# Patient Record
Sex: Male | Born: 1955 | Race: Black or African American | Hispanic: No | Marital: Single | Smoking: Current every day smoker
Health system: Southern US, Community
[De-identification: ages and names within clinical notes are randomized; demographics above are authoritative.]

## PROBLEM LIST (undated history)

## (undated) DIAGNOSIS — C61 Malignant neoplasm of prostate: Secondary | ICD-10-CM

## (undated) DIAGNOSIS — E7800 Pure hypercholesterolemia, unspecified: Secondary | ICD-10-CM

## (undated) DIAGNOSIS — I10 Essential (primary) hypertension: Secondary | ICD-10-CM

## (undated) DIAGNOSIS — I714 Abdominal aortic aneurysm, without rupture, unspecified: Secondary | ICD-10-CM

## (undated) HISTORY — DX: Essential (primary) hypertension: I10

## (undated) HISTORY — DX: Malignant neoplasm of prostate: C61

## (undated) HISTORY — DX: Abdominal aortic aneurysm, without rupture, unspecified: I71.40

---

## 1998-08-31 ENCOUNTER — Emergency Department (HOSPITAL_COMMUNITY): Admission: EM | Admit: 1998-08-31 | Discharge: 1998-08-31 | Payer: Self-pay | Admitting: Emergency Medicine

## 1998-09-07 ENCOUNTER — Emergency Department (HOSPITAL_COMMUNITY): Admission: EM | Admit: 1998-09-07 | Discharge: 1998-09-07 | Payer: Self-pay | Admitting: Emergency Medicine

## 2004-11-12 ENCOUNTER — Emergency Department (HOSPITAL_COMMUNITY): Admission: EM | Admit: 2004-11-12 | Discharge: 2004-11-12 | Payer: Self-pay | Admitting: Emergency Medicine

## 2004-12-04 ENCOUNTER — Emergency Department (HOSPITAL_COMMUNITY): Admission: EM | Admit: 2004-12-04 | Discharge: 2004-12-05 | Payer: Self-pay | Admitting: Emergency Medicine

## 2007-02-16 ENCOUNTER — Emergency Department (HOSPITAL_COMMUNITY): Admission: EM | Admit: 2007-02-16 | Discharge: 2007-02-16 | Payer: Self-pay | Admitting: Emergency Medicine

## 2007-02-16 ENCOUNTER — Inpatient Hospital Stay (HOSPITAL_COMMUNITY): Admission: AD | Admit: 2007-02-16 | Discharge: 2007-02-19 | Payer: Self-pay | Admitting: Psychiatry

## 2007-02-16 ENCOUNTER — Ambulatory Visit: Payer: Self-pay | Admitting: Psychiatry

## 2009-10-08 ENCOUNTER — Emergency Department: Payer: Self-pay | Admitting: Internal Medicine

## 2010-01-14 ENCOUNTER — Emergency Department (HOSPITAL_COMMUNITY): Admission: EM | Admit: 2010-01-14 | Discharge: 2010-01-14 | Payer: Self-pay | Admitting: Emergency Medicine

## 2010-08-29 NOTE — H&P (Signed)
NAMEMarland Pratt  GABERIEL, [name]:  [id number]   MEDICAL RECORD NO.:  [id number]          PATIENT TYPE:  IPS   LOCATION:  0502                          FACILITY:  [facility]   PHYSICIAN:  Geoffery Lyons, M.D.      DATE OF BIRTH:  02-Jun-1955   DATE OF ADMISSION:  02/16/2007  DATE OF DISCHARGE:                       PSYCHIATRIC ADMISSION ASSESSMENT   IDENTIFYING INFORMATION:  [name], divorced.  This is a voluntary admission.   HISTORY OF THE PRESENT ILLNESS:  This patient went to the emergency room  after smoking crack cocaine when he developed chest pain, admitted there  to having suicidal thoughts, feeling he cannot get his addiction under  control, and future is hopeless.  He has been using a couple times a  week cocaine, smoking rock, with a history of 25 years of cocaine use,  with intermittent periods of abstinence ranging from 3-6 months.  Most  recently he has been using about twice a week for several weeks.  He  says he gets depressed later in the day, begins to drink a couple of  beers, then will use cocaine to get high.  Endorses psychosocial  stressors of dissatisfaction with his current relationship and home  situation, is planning to leave his current girlfriend with whom he  lives, but has been unable to get financially stable enough to do so.  Would like to stop using drugs.  Denies any prior suicide attempts.  He  denies any symptoms of alcohol withdrawal.   PAST PSYCHIATRIC HISTORY:  The patient has one prior admission to [facility] in 1998 followed by a series of outpatient  treatments in the chemical dependency intensive outpatient program at  [facility] and maintained abstinence for about 3 months after that.  He  reports no prior psychotropic medications.  No history of learning  problems, brain injury, or blackout spells.   SOCIAL HISTORY:  The patient is a divorced African-American male with 3  children by 3  different relationships, children ages 110 and 32, with the  56-year-old living with him currently with his current girlfriend, along  with 3 of her children.  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  He is paying child support and having his wages garnished at  this time for back taxes.  He has a history of DWI about 5-6 years ago,  no current charges.   FAMILY HISTORY:  Remarkable for both parents with regular alcohol use  and eldest brother committed suicide at age 46 and was also a heavy  drinker.   MEDICAL HISTORY:  Patient is smoking 1 pack per day of tobacco.  Significant medical problems are chest pain after the cocaine use, this  was his first episode of chest pain, and his workup in the emergency  room was negative for acute cardiac event.   CURRENT MEDICATIONS:  None.   DRUG ALLERGIES:  NONE.   POSITIVE PHYSICAL FINDINGS:  His full physical exam is noted in the  emergency room and is unremarkable.  A healthy-appearing African-  American male in no distress.  Urine drug screen was positive for  cocaine and CBC showed some mild leukocytosis with WBC 13.3.  His EKG  showed normal sinus rhythm.  Alcohol level was less 7.  Vital signs  normal and cardiac markers were within normal limits.  His TSH is normal  at 2.001.   MENTAL STATUS EXAM:  Fully alert male, polite and appropriate.  He is  calm and well focused.  Speech is normal in pace, tone and production.  He is articulate, relevant.  Mood is depressed.  Thought process  logical, linear, no evidence of psychosis.  No delusional statements, no  paranoia or guarding.  He has expressed a lot of frustration with his  current home situation, finding it difficult to go back there in the  evenings, which triggers additional drug use, feeling frustrated, trying  to think through how best to move on with his life.  Cognition is well-  preserved.   Insight good.  Impulse control and judgment within normal  limits.  Concentration and calculation are intact.  Cognition is fully  preserved.   Axis I:  Depressive disorder, not otherwise specified; cocaine abuse,  rule out dependence.  Axis II:  Deferred.  Axis III:  Tobacco abuse.  Axis IV:  Severe issues with relationship conflict and finances.  Axis V:  Current 38, past year 70.   PLAN:  Is to voluntarily admit the patient with a goal of alleviating  his suicidal thought.  We are going to start him on trazodone 100 mg at  bedtime, may be repeated x1, and Symmetrel 100 mg p.o. b.i.d. for his  cocaine dependence.  Will give him a chance to settle down, start him  with counseling, and we will proceed from there.  Estimated length of  stay is 5 days.  He is currently on sponsorship from [facility] and they have seen him.  He will plan to follow up there.      Margaret A. Scott, N.P.      Geoffery Lyons, M.D.  Electronically Signed    MAS/MEDQ  D:  02/17/2007  T:  02/18/2007  Job:  [id number]

## 2010-09-01 NOTE — Discharge Summary (Signed)
Tyler Pratt, Tyler Pratt NO.:  [id number]   MEDICAL RECORD NO.:  [id number]          PATIENT TYPE:  IPS   LOCATION:  0502                          FACILITY:  [facility]   PHYSICIAN:  Geoffery Lyons, M.D.      DATE OF BIRTH:  [birth year]   DATE OF ADMISSION:  02/16/2007  DATE OF DISCHARGE:  02/19/2007                               DISCHARGE SUMMARY   CHIEF COMPLAINT:  Chest pain.   HISTORY OF PRESENT ILLNESS:  This was the second admission to [facility] for this 55 year old African American male admitted for  __________ .  He developed chest pain.  admitted to having suicidal  thoughts, saying he could not get his __________ under control, feeling  hopeless, has been using a couple of times a week, smoking __________ 25  years of cocaine use.  Intermittent use of __________.  More recently  has been using twice a week for several weeks, is depressed, said in the  day he needs to drink a couple of beers then will use cocaine to  __________.  He is satisfied with his current relationship and home  situation, planning to __________ according to girlfriend with whom he  lives.   PAST PSYCHIATRIC HISTORY:  One prior admission __________ at [facility] followed by a series of outpatient treatments in the __________  [facility].  Obtained abstinence for 10 months.   PAST MEDICAL HISTORY:  As already stated.  Persistent use of cocaine as  well as alcohol.   MEDICAL HISTORY:  Noncontributory.   MEDICATIONS:  None.   PHYSICAL EXAMINATION:  Performed failed to show any acute findings.   LABORATORY WORKUP:  PSA is 2.001.  CBC:  White blood cells 13.3.  EKG:  Within normal limits.  TSH 2.001.  UDS:  Positive for cocaine.   MENTAL STATUS EXAMINATION:  At this time reveals an alert, cooperative  male.  Speech was normal in pace, tone and production; articulate.  Mood  was depressed.  Affect depressed.  Thought process was coherent and  relevant.  No evidence of  delusions, no hallucinations.   ADMISSION DIAGNOSES:  AXIS I:  Depressive disorder, not otherwise  specified.  Cocaine, alcohol abuse, rule out dependence.  AXIS II:  No diagnosis.  AXIS III:  No diagnosis.  AXIS IV:  Moderate.  AXIS V:  Upon admission, 55.  Highest GAF in the last year is 65.   COURSE IN THE HOSPITAL:  He was admitted.  He was started in individual  and group psychotherapy.  __________ use of crack cocaine, been  __________ for the last 3 days and __________ chest pain.  Did not want  to live anymore, using a couple of times a week, started drinking then  using crack.  Endorsed that he also uses whatever he can get to help him  relax __________ and opioids.  Endorsed __________ sleep.  Endorses that  he does not have a home, living with a girlfriend.  Denies having  children.  __________ years old, older kids.  Working in a group home  and  the [place] and 12 hours a day and a couple of hours during the  weekends.  On admission, had some suicidal thoughts with plans of  overdosing.   PAST PSYCHIATRIC HISTORY:  [place] inpatient and then __________ as  well as [facility].  He endorsed that he knew he could  not go back to the same ways of behavior.  He had chest pains the last  time he was using cocaine.  __________ the fact that he did not want to  die then.  __________ that he wants to be alive.  He believes that he  wants to be alive ago __________ back to using cocaine and he wants to  be successful.  However, __________ he was being spiteful and was going  back with the girlfriend but __________ in several months get a place  and stay by himself.  Meanwhile, endorses he was going to make it work  as far as being in the house.  Avoid any conflict __________ with the  girlfriend.  He was committed to __________, and there were no suicidal  or homicidal ideas, hallucinations and there was no evidence of  delusions.  __________ and treatment of  what we are going to have and  discharge to outpatient followup.   DISCHARGE DIAGNOSES:  AXIS I:  Cocaine, alcohol, benzodiazepines and  opiate abuse, depressive disorder not otherwise specified.  AXIS II:  No diagnosis.  AXIS III:  No diagnosis.  AXIS IV:  Moderate.  AXIS V:  On discharge, __________.   Discharged on __________ 100 twice a day, __________ at bedtime as  needed for sleep, __________ used for __________ and __________ for  counseling.      Geoffery Lyons, M.D.  Electronically Signed     IL/MEDQ  D:  03/10/2007  T:  03/11/2007  Job:  [id number]

## 2011-01-23 LAB — BASIC METABOLIC PANEL
BUN: 8
Chloride: 104
Potassium: 3.7
Sodium: 139

## 2011-01-23 LAB — DIFFERENTIAL
Eosinophils Absolute: 0
Eosinophils Relative: 0
Lymphs Abs: 2
Monocytes Absolute: 0.7
Monocytes Relative: 5

## 2011-01-23 LAB — RAPID URINE DRUG SCREEN, HOSP PERFORMED
Barbiturates: NOT DETECTED
Benzodiazepines: NOT DETECTED

## 2011-01-23 LAB — CBC
HCT: 39.6
Hemoglobin: 13.6
MCV: 90.7
Platelets: 310
RBC: 4.36
WBC: 13.3 — ABNORMAL HIGH

## 2011-01-23 LAB — HEPATIC FUNCTION PANEL
ALT: 12
AST: 13
Albumin: 3.6
Total Protein: 6.6

## 2011-01-23 LAB — TSH: TSH: 2.001

## 2011-01-23 LAB — ETHANOL: Alcohol, Ethyl (B): 7

## 2011-01-23 LAB — POCT CARDIAC MARKERS: Troponin i, poc: <0.05

## 2013-04-02 LAB — HEPATIC FUNCTION PANEL
ALT: 16 U/L (ref 10–40)
AST: 18 U/L (ref 14–40)
Alkaline Phosphatase: 85 U/L (ref 25–125)
Bilirubin, Direct: 0.08 mg/dL (ref 0.01–0.4)
Bilirubin, Total: 0.3 mg/dL

## 2013-04-02 LAB — LIPID PANEL
Cholesterol: 278 mg/dL — AB (ref 0–200)
HDL: 36 mg/dL (ref 35–70)
LDL CALC: 186 mg/dL
TRIGLYCERIDES: 280 mg/dL — AB (ref 40–160)

## 2013-04-02 LAB — CBC AND DIFFERENTIAL
HCT: 41 % (ref 41–53)
HEMOGLOBIN: 14 g/dL (ref 13.5–17.5)
NEUTROS ABS: 5 /uL
PLATELETS: 342 10*3/uL (ref 150–399)
WBC: 8.6 10^3/mL

## 2013-04-02 LAB — BASIC METABOLIC PANEL
BUN: 12 mg/dL (ref 4–21)
Creatinine: 1 mg/dL (ref 0.6–1.3)
Glucose: 82 mg/dL
Potassium: 4.6 mmol/L (ref 3.4–5.3)
SODIUM: 142 mmol/L (ref 137–147)

## 2013-04-02 LAB — HEMOGLOBIN A1C: Hemoglobin A1C: 5.8

## 2013-04-14 ENCOUNTER — Ambulatory Visit: Payer: Self-pay

## 2013-10-25 ENCOUNTER — Emergency Department (HOSPITAL_COMMUNITY)
Admission: EM | Admit: 2013-10-25 | Discharge: 2013-10-25 | Disposition: A | Discharge status: Home / Self Care | Payer: Self-pay | Attending: Emergency Medicine | Admitting: Emergency Medicine

## 2013-10-25 ENCOUNTER — Encounter (HOSPITAL_COMMUNITY): Payer: Self-pay | Admitting: Emergency Medicine

## 2013-10-25 ENCOUNTER — Emergency Department (HOSPITAL_COMMUNITY): Payer: Self-pay

## 2013-10-25 DIAGNOSIS — Z8639 Personal history of other endocrine, nutritional and metabolic disease: Secondary | ICD-10-CM | POA: Insufficient documentation

## 2013-10-25 DIAGNOSIS — F17200 Nicotine dependence, unspecified, uncomplicated: Secondary | ICD-10-CM | POA: Insufficient documentation

## 2013-10-25 DIAGNOSIS — Z862 Personal history of diseases of the blood and blood-forming organs and certain disorders involving the immune mechanism: Secondary | ICD-10-CM | POA: Insufficient documentation

## 2013-10-25 DIAGNOSIS — N453 Epididymo-orchitis: Secondary | ICD-10-CM | POA: Insufficient documentation

## 2013-10-25 DIAGNOSIS — N451 Epididymitis: Secondary | ICD-10-CM

## 2013-10-25 HISTORY — DX: Pure hypercholesterolemia, unspecified: E78.00

## 2013-10-25 LAB — URINALYSIS, ROUTINE W REFLEX MICROSCOPIC
Glucose, UA: NEGATIVE mg/dL
HGB URINE DIPSTICK: NEGATIVE
Ketones, ur: NEGATIVE mg/dL
Nitrite: NEGATIVE
PROTEIN: NEGATIVE mg/dL
SPECIFIC GRAVITY, URINE: 1.034 — AB (ref 1.005–1.030)
Urobilinogen, UA: 1 mg/dL (ref 0.0–1.0)
pH: 5.5 (ref 5.0–8.0)

## 2013-10-25 LAB — URINE MICROSCOPIC-ADD ON

## 2013-10-25 MED ORDER — CEFTRIAXONE SODIUM 250 MG IJ SOLR
250.0000 mg | Freq: Once | INTRAMUSCULAR | Status: AC
  Administered 2013-10-25: 250 mg via INTRAMUSCULAR
  Filled 2013-10-25: qty 250

## 2013-10-25 MED ORDER — HYDROCODONE-ACETAMINOPHEN 5-325 MG PO TABS: 1.0000 | ORAL_TABLET | Freq: Four times a day (QID) | ORAL | Status: DC | PRN

## 2013-10-25 MED ORDER — CIPROFLOXACIN HCL 500 MG PO TABS
500.0000 mg | ORAL_TABLET | Freq: Two times a day (BID) | ORAL | Status: DC
Start: 2013-10-25 — End: 2020-08-23

## 2013-10-25 MED ORDER — IBUPROFEN 200 MG PO TABS
600.0000 mg | ORAL_TABLET | Freq: Once | ORAL | Status: AC
  Administered 2013-10-25: 600 mg via ORAL
  Filled 2013-10-25: qty 3

## 2013-10-25 MED ORDER — AZITHROMYCIN 250 MG PO TABS
1000.0000 mg | ORAL_TABLET | Freq: Once | ORAL | Status: AC
  Administered 2013-10-25: 1000 mg via ORAL
  Filled 2013-10-25: qty 4

## 2013-10-25 NOTE — ED Notes (Signed)
Pt reports unprotected sex 2 weeks ago, pt has had scrotal pain x1 week. Pain 8/10. Denies drainage.

## 2013-10-25 NOTE — ED Notes (Signed)
Bed: WTR7 Expected date:  Expected time:  Means of arrival:  Comments: [place]

## 2013-10-25 NOTE — Discharge Instructions (Signed)
Take cipro (antibiotic) as prescribed. Take motrin or aleve as need for pain. You may also take hydrocodone as need for pain. No driving for the next 6 hours or when taking hydrocodone. Also, do not take tylenol or acetaminophen containing medication when taking hydrocodone. Follow up with primary care doctor in 1 week if symptoms fail to improve/resolve. Return to ER if worse, new symptoms, severe pain, high fevers, persistent vomiting, other concern.  Your ultrasound was read as showing epididymitis, and hydrocele - follow up with your doctor.    Epididymitis Epididymitis is a swelling (inflammation) of the epididymis. The epididymis is a cord-like structure along the back part of the testicle. Epididymitis is usually, but not always, caused by infection. This is usually a sudden problem beginning with chills, fever and pain behind the scrotum and in the testicle. There may be swelling and redness of the testicle. DIAGNOSIS  Physical examination will reveal a tender, swollen epididymis. Sometimes, cultures are obtained from the urine or from prostate secretions to help find out if there is an infection or if the cause is a different problem. Sometimes, blood work is performed to see if your white blood cell count is elevated and if a germ (bacterial) or viral infection is present. Using this knowledge, an appropriate medicine which kills germs (antibiotic) can be chosen by your caregiver. A viral infection causing epididymitis will most often go away (resolve) without treatment. HOME CARE INSTRUCTIONS   Hot sitz baths for 20 minutes, 4 times per day, may help relieve pain.  Only take over-the-counter or prescription medicines for pain, discomfort or fever as directed by your caregiver.  Take all medicines, including antibiotics, as directed. Take the antibiotics for the full prescribed length of time even if you are feeling better.  It is very important to keep all follow-up appointments. SEEK  IMMEDIATE MEDICAL CARE IF:   You have a fever.  You have pain not relieved with medicines.  You have any worsening of your problems.  Your pain seems to come and go.  You develop pain, redness, and swelling in the scrotum and surrounding areas. MAKE SURE YOU:   Understand these instructions.  Will watch your condition.  Will get help right away if you are not doing well or get worse. Document Released: 03/30/2000 Document Revised: 06/25/2011 Document Reviewed: 02/17/2009 [facility] Patient Information 2015 [place], [place]. This information is not intended to replace advice given to you by your health care provider. Make sure you discuss any questions you have with your health care provider.    Safe Sex Safe sex is about reducing the risk of giving or getting a sexually transmitted disease (STD). STDs are spread through sexual contact involving the genitals, mouth, or rectum. Some STDs can be cured and others cannot. Safe sex can also prevent unintended pregnancies.  WHAT ARE SOME SAFE SEX PRACTICES?  Limit your sexual activity to only one partner who is only having sex with you.  Talk to your partner about his or her past partners, past STDs, and drug use.  Use a condom every time you have sexual intercourse. This includes vaginal, oral, and anal sexual activity. Both females and males should wear condoms during oral sex. Only use latex or polyurethane condoms and water-based lubricants. Using petroleum-based lubricants or oils to lubricate a condom will weaken the condom and increase the chance that it will break. The condom should be in place from the beginning to the end of sexual activity. Wearing a condom reduces, but  does not completely eliminate, your risk of getting or giving an STD. STDs can be spread by contact with infected body fluids and skin.  Get vaccinated for hepatitis B and HPV.  Avoid alcohol and recreational drugs which can affect your judgement. You may forget to  use a condom or participate in high-risk sex.  For females, avoid douching after sexual intercourse. Douching can spread an infection farther into the reproductive tract.  Check your body for signs of sores, blisters, rashes, or unusual discharge. See your health care provider if you notice any of these signs.  Avoid sexual contact if you have symptoms of an infection or are being treated for an STD. If you or your partner has herpes, avoid sexual contact when blisters are present. Use condoms at all other times.  If you are at risk of being infected with HIV, it is recommended that you take a prescription medicine daily to prevent HIV infection. This is called pre-exposure prophylaxis (PrEP). You are considered at risk if:  You are a man who has sex with other men (MSM).  You are a heterosexual man or woman who is sexually active with more than one partner.  You take drugs by injection.  You are sexually active with a partner who has HIV.  Talk with your health care provider about whether you are at high risk of being infected with HIV. If you choose to begin PrEP, you should first be tested for HIV. You should then be tested every 3 months for as long as you are taking PrEP.  See your health care provider for regular screenings, exams, and tests for other STDs. Before having sex with a new partner, each of you should be screened for STDs and should talk about the results with each other. WHAT ARE THE BENEFITS OF SAFE SEX?   There is less chance of getting or giving an STD.  You can prevent unwanted or unintended pregnancies.  By discussing safe sex concerns with your partner, you may increase feelings of intimacy, comfort, trust, and honesty between the two of you. Document Released: 05/10/2004 Document Revised: 04/07/2013 Document Reviewed: 09/24/2011 [facility] Patient Information 2015 [place], [place]. This information is not intended to replace advice given to you by your health care  provider. Make sure you discuss any questions you have with your health care provider.    Hydrocele, Adult Fluid can collect around the testicles. This fluid forms in a sac. This condition is called a hydrocele. The collected fluid causes swelling of the scrotum. Usually, it affects just one testicle. Most of the time, the condition does not cause pain. Sometimes, the hydrocele goes away on its own. Other times, surgery is needed to get rid of the fluid. CAUSES A hydrocele does not develop often. Different things can cause a hydrocele in a man, including:  Injury to the scrotum.  Infection.  X-ray of the area around the scrotum.  A tumor or cancer of the testicle.  Twisting of a testicle.  Decreased blood flow to the scrotum. SYMPTOMS   Swelling without pain. The hydrocele feels like a water-filled balloon.  Swelling with pain. This can occur if the hydrocele was caused by infection or twisting.  Mild discomfort in the scrotum.  The hydrocele may feel heavy.  Swelling that gets smaller when you lie down. DIAGNOSIS  Your caregiver will do a physical exam to decide if you have a hydrocele. This may include:  Asking questions about your overall health, today and in  the past. Your caregiver may ask about any injuries, X-rays, or infections.  Pushing on your abdomen or asking you to change positions to see if the size of the hydrocele changes.  Shining a light through the scrotum (transillumination) to see if the fluid inside the scrotum is clear.  Blood tests and urine tests to check for infection.  Imaging studies that take pictures of the scrotum and testicles. TREATMENT  Treatment depends in part on what caused the condition. Options include:  Watchful waiting. Your caregiver checks the hydrocele every so often.  Different surgeries to drain the fluid.  A needle may be put into the scrotum to drain fluid (needle aspiration). Fluid often returns after this type of  treatment.  A cut (incision) may be made in the scrotum to remove the fluid sac (hydrocelectomy).  An incision may be made in the groin to repair a hydrocele that has contact with abdominal fluids (communicating hydrocele).  Medicines to treat an infection (antibiotics). HOME CARE INSTRUCTIONS  What you need to do at home may depend on the cause of the hydrocele and type of treatment. In general:  Take all medicine as directed by your caregiver. Follow the directions carefully.  Ask your caregiver if there is anything you should not do while you recover (activities, lifting, work, sex).  If you had surgery to repair a communicating hydrocele, recovery time may vary. Ask you caregiver about your recovery time.  Avoid heavy lifting for 4 to 6 weeks.  If you had an incision on the scrotum or groin, wash it for 2 to 3 days after surgery. Do this as long as the skin is closed and there are no gaps in the wound. Wash gently, and avoid rubbing the incision.  Keep all follow-up appointments. SEEK MEDICAL CARE IF:   Your scrotum seems to be getting larger.  The area becomes more and more uncomfortable. SEEK IMMEDIATE MEDICAL CARE IF:  You have a fever. Document Released: 09/20/2009 Document Revised: 01/21/2013 Document Reviewed: 09/20/2009 [facility] Patient Information 2015 [place], [place]. This information is not intended to replace advice given to you by your health care provider. Make sure you discuss any questions you have with your health care provider.

## 2013-10-25 NOTE — ED Provider Notes (Signed)
CSN: [id number]     Arrival date & time 10/25/13  1043 History   First MD Initiated Contact with Patient 10/25/13 1117     Chief Complaint  Patient presents with  . SEXUALLY TRANSMITTED DISEASE     (Consider location/radiation/quality/duration/timing/severity/associated sxs/prior Treatment) The history is provided by the patient.  pt c/o left testicle/scrotum pain in past few days, constant, dull, moderate-severe, states acutely worse in past day. No dysuria or hematuria. No hx same. No trauma to area. Notes recent unprotected sex w unknown male. No known std exposure. No genital sores/ulcers. No penile discharge or dysuria. No hx epididymitis or torsion. No drainage to area.  No fever or chills. No abd pain.     Past Medical History  Diagnosis Date  . High cholesterol    History reviewed. No pertinent past surgical history. History reviewed. No pertinent family history. History  Substance Use Topics  . Smoking status: Current Every Day Smoker -- 1.00 packs/day for 40 years    Types: Cigarettes  . Smokeless tobacco: Not on file  . Alcohol Use: Yes     Comment: weekly     Review of Systems  Constitutional: Negative for fever and chills.  HENT: Negative for sore throat.   Eyes: Negative for redness.  Respiratory: Negative for shortness of breath.   Cardiovascular: Negative for chest pain.  Gastrointestinal: Negative for vomiting and abdominal pain.  Genitourinary: Positive for scrotal swelling and testicular pain. Negative for dysuria, hematuria and flank pain.  Musculoskeletal: Negative for back pain and neck pain.  Skin: Negative for rash.  Neurological: Negative for headaches.  Hematological: Does not bruise/bleed easily.  Psychiatric/Behavioral: Negative for confusion.      Allergies  Review of patient's allergies indicates no known allergies.  Home Medications   Prior to Admission medications   Medication Sig Start Date End Date Taking? Authorizing Provider   HYDROcodone-acetaminophen (NORCO) 7.5-325 MG per tablet Take 1 tablet by mouth once.   Yes Historical Provider, MD   BP 127/87  Pulse 92  Temp(Src) 98.7 F (37.1 C) (Oral)  Resp 16  SpO2 100% Physical Exam  Nursing note and vitals reviewed. Constitutional: He is oriented to person, place, and time. He appears well-developed and well-nourished. No distress.  HENT:  Mouth/Throat: Oropharynx is clear and moist.  Neck: Neck supple. No tracheal deviation present.  Cardiovascular: Normal rate.   Pulmonary/Chest: Effort normal. No accessory muscle usage. No respiratory distress.  Abdominal: Soft. Bowel sounds are normal. He exhibits no distension. There is no tenderness.  Genitourinary:  Left testicle/scrotal swelling, diffuse left testicle tenderness ?esp epididymal tenderness. No cellulitis or skin changes. No fluctuance/abscess. No penile discharge. No fem/ing l/a.   Musculoskeletal: Normal range of motion. He exhibits no edema.  Neurological: He is alert and oriented to person, place, and time.  Skin: Skin is warm and dry. No rash noted. He is not diaphoretic.  Psychiatric: He has a normal mood and affect.    ED Course  Procedures (including critical care time) Labs Review  Results for orders placed during the hospital encounter of 10/25/13  URINALYSIS, ROUTINE W REFLEX MICROSCOPIC      Result Value Ref Range   Color, Urine AMBER (*) YELLOW   APPearance CLEAR  CLEAR   Specific Gravity, Urine 1.034 (*) 1.005 - 1.030   pH 5.5  5.0 - 8.0   Glucose, UA NEGATIVE  NEGATIVE mg/dL   Hgb urine dipstick NEGATIVE  NEGATIVE   Bilirubin Urine SMALL (*) NEGATIVE  Ketones, ur NEGATIVE  NEGATIVE mg/dL   Protein, ur NEGATIVE  NEGATIVE mg/dL   Urobilinogen, UA 1.0  0.0 - 1.0 mg/dL   Nitrite NEGATIVE  NEGATIVE   Leukocytes, UA TRACE (*) NEGATIVE  URINE MICROSCOPIC-ADD ON      Result Value Ref Range   WBC, UA 0-2  <3 WBC/hpf   Bacteria, UA FEW (*) RARE   Urine-Other MUCOUS PRESENT      US Scrotum  10/25/2013   CLINICAL DATA:  Left testicular pain.  EXAM: SCROTAL ULTRASOUND  DOPPLER ULTRASOUND OF THE TESTICLES  TECHNIQUE: Complete ultrasound examination of the testicles, epididymis, and other scrotal structures was performed. Color and spectral Doppler ultrasound were also utilized to evaluate blood flow to the testicles.  COMPARISON:  None.  FINDINGS: Right testicle  Measurements: 4.1 x 1.8 x 2.8 cm.  6 mm simple cyst.  Left testicle  Measurements:  3.8 x 2.4 x 3.2 cm.  3 mm simple cyst.  Right epididymis:  Normal in size and appearance.  Left epididymis: Heterogeneous enlarged left epididymis with increased flow consistent epididymitis.  Hydrocele: Bilateral hydroceles with moderate-sized hydrocele on the left.  Varicocele:  None visualized.  Pulsed Doppler interrogation of both testes demonstrates low resistance arterial and venous waveforms bilaterally.  IMPRESSION: Findings consist with left epididymitis with moderate-sized hydrocele. No evidence of testicular torsion.   Electronically Signed   By: Marcello Moores  Register   On: 10/25/2013 13:46   [place] Art/ven Flow Abd Pelv Doppler  10/25/2013   CLINICAL DATA:  Left testicular pain.  EXAM: SCROTAL ULTRASOUND  DOPPLER ULTRASOUND OF THE TESTICLES  TECHNIQUE: Complete ultrasound examination of the testicles, epididymis, and other scrotal structures was performed. Color and spectral Doppler ultrasound were also utilized to evaluate blood flow to the testicles.  COMPARISON:  None.  FINDINGS: Right testicle  Measurements: 4.1 x 1.8 x 2.8 cm.  6 mm simple cyst.  Left testicle  Measurements:  3.8 x 2.4 x 3.2 cm.  3 mm simple cyst.  Right epididymis:  Normal in size and appearance.  Left epididymis: Heterogeneous enlarged left epididymis with increased flow consistent epididymitis.  Hydrocele: Bilateral hydroceles with moderate-sized hydrocele on the left.  Varicocele:  None visualized.  Pulsed Doppler interrogation of both testes demonstrates low  resistance arterial and venous waveforms bilaterally.  IMPRESSION: Findings consist with left epididymitis with moderate-sized hydrocele. No evidence of testicular torsion.   Electronically Signed   By: Marcello Moores  Register   On: 10/25/2013 13:46     MDM  Labs. Motrin po.   Pt notes recent unprotected sex.   Rocephin im. Zithromax.   U/s c/w epididymitis.  rx cipro and pain rx for home.  Pt stable for d/c.     Mirna Mires, MD 10/25/13 [phone]

## 2016-07-27 ENCOUNTER — Emergency Department (HOSPITAL_COMMUNITY)
Admission: EM | Admit: 2016-07-27 | Discharge: 2016-07-27 | Disposition: A | Discharge status: Home / Self Care | Payer: Self-pay | Attending: Emergency Medicine | Admitting: Emergency Medicine

## 2016-07-27 ENCOUNTER — Encounter (HOSPITAL_COMMUNITY): Payer: Self-pay | Admitting: Emergency Medicine

## 2016-07-27 DIAGNOSIS — J020 Streptococcal pharyngitis: Secondary | ICD-10-CM | POA: Insufficient documentation

## 2016-07-27 DIAGNOSIS — Z79899 Other long term (current) drug therapy: Secondary | ICD-10-CM | POA: Insufficient documentation

## 2016-07-27 DIAGNOSIS — F17210 Nicotine dependence, cigarettes, uncomplicated: Secondary | ICD-10-CM | POA: Insufficient documentation

## 2016-07-27 LAB — RAPID STREP SCREEN (MED CTR MEBANE ONLY): STREPTOCOCCUS, GROUP A SCREEN (DIRECT): POSITIVE — AB

## 2016-07-27 MED ORDER — DEXAMETHASONE 4 MG PO TABS
10.0000 mg | ORAL_TABLET | Freq: Once | ORAL | Status: AC
  Administered 2016-07-27: 10 mg via ORAL
  Filled 2016-07-27: qty 2

## 2016-07-27 MED ORDER — PENICILLIN G BENZATHINE 1200000 UNIT/2ML IM SUSP
1.2000 10*6.[IU] | Freq: Once | INTRAMUSCULAR | Status: AC
  Administered 2016-07-27: 1.2 10*6.[IU] via INTRAMUSCULAR
  Filled 2016-07-27: qty 2

## 2016-07-27 NOTE — Discharge Instructions (Signed)
Please read attached information. If you experience any new or worsening signs or symptoms please return to the emergency room for evaluation. Please follow-up with your primary care provider or specialist as discussed. Please use medication prescribed only as directed and discontinue taking if you have any concerning signs or symptoms.   °

## 2016-07-27 NOTE — ED Provider Notes (Signed)
[facility] DEPT Provider Note   CSN: [id number] Arrival date & time: 07/27/16  1005 By signing my name below, I, Georgette Shell, attest that this documentation has been prepared under the direction and in the presence of [employer], PA-C. Electronically Signed: Georgette Shell, ED Scribe. 07/27/16. 11:35 AM.  History   Chief Complaint Chief Complaint  Patient presents with  . Sore Throat    HPI The history is provided by the patient. No language interpreter was used.   HPI Comments: Tyler Pratt is a 61 y.o. male with h/o HLD, who presents to the Emergency Department complaining of sore throat beginning three days ago. He reports he has been having painful swallowing over the last several days. Pt reports subjective fever last night. Pt states pain is exacerbated with swallowing and talking. Pt has taken Aleve and Advil with no relief to his symptoms. Denies h/o similar symptoms. Notes he has been around people who have had strep throat. Pt denies fever, chills, or any other associated symptoms.   Past Medical History:  Diagnosis Date  . High cholesterol     There are no active problems to display for this patient.   History reviewed. No pertinent surgical history.     Home Medications    Prior to Admission medications   Medication Sig Start Date End Date Taking? Authorizing Provider  ciprofloxacin (CIPRO) 500 MG tablet Take 1 tablet (500 mg total) by mouth 2 (two) times daily. 10/25/13   Lajean Saver, MD  HYDROcodone-acetaminophen (NORCO) 7.5-325 MG per tablet Take 1 tablet by mouth once.    Historical Provider, MD  HYDROcodone-acetaminophen (NORCO/VICODIN) 5-325 MG per tablet Take 1-2 tablets by mouth every 6 (six) hours as needed for moderate pain. 10/25/13   Lajean Saver, MD    Family History Family History  Problem Relation Age of Onset  . Hypertension Mother   . Cancer Mother   . Hypertension Father   . Cancer Father   . Diabetes Maternal Grandmother   . Heart  disease Paternal Grandfather     Social History Social History  Substance Use Topics  . Smoking status: Current Every Day Smoker    Packs/day: 1.00    Years: 40.00    Types: Cigarettes  . Smokeless tobacco: Never Used  . Alcohol use Yes     Comment: weekly      Allergies   Patient has no known allergies.   Review of Systems Review of Systems Ten systems reviewed and are negative for acute change, except as noted in the HPI. Physical Exam Updated Vital Signs BP 126/79   Pulse 98   Temp 98.6 F (37 C) (Oral)   Resp 20   Ht 5\' 10"  (1.778 m)   Wt 79.4 kg   SpO2 99%   BMI 25.11 kg/m   Physical Exam  Constitutional: He appears well-developed and well-nourished.  HENT:  Head: Normocephalic.  Mouth/Throat: Uvula is midline.  Bilateral tonsillar swelling and erythema. No signs of RPA or PTA. Uvula is midline and rises with phonation. Bilateral and anterior cervical lymphadenopathy with tenderness along the left anterior lymph node.  Eyes: Conjunctivae are normal.  Cardiovascular: Normal rate.   Pulmonary/Chest: Effort normal. No respiratory distress.  Abdominal: He exhibits no distension.  Musculoskeletal: Normal range of motion.  Neurological: He is alert.  Skin: Skin is warm and dry.  Psychiatric: He has a normal mood and affect. His behavior is normal.  Nursing note and vitals reviewed.    ED Treatments /  Results  DIAGNOSTIC STUDIES: Oxygen Saturation is 97% on RA, adequate by my interpretation.   COORDINATION OF CARE: 11:35 AM-Discussed next steps with pt. Pt verbalized understanding and is agreeable with the plan.   Labs (all labs ordered are listed, but only abnormal results are displayed) Labs Reviewed  RAPID STREP SCREEN (NOT AT [facility]) - Abnormal; Notable for the following:       Result Value   Streptococcus, Group A Screen (Direct) POSITIVE (*)    All other components within normal limits    EKG  EKG Interpretation None       Radiology No  results found.  Procedures Procedures (including critical care time)  Medications Ordered in ED Medications  dexamethasone (DECADRON) tablet 10 mg (10 mg Oral Given 07/27/16 1155)  penicillin g benzathine (BICILLIN LA) 1200000 UNIT/2ML injection 1.2 Million Units (1.2 Million Units Intramuscular Given 07/27/16 1157)     Initial Impression / Assessment and Plan / ED Course  I have reviewed the triage vital signs and the nursing notes.  Pertinent labs & imaging results that were available during my care of the patient were reviewed by me and considered in my medical decision making (see chart for details).       Final Clinical Impressions(s) / ED Diagnoses   Final diagnoses:  Strep throat   Labs: Rapid strep positive  Imaging:  Consults:  Therapeutics: Decadron, penicillin  Discharge Meds:   Assessment/Plan: 61 year old male presents today with strep pharyngitis.  Patient has no signs of peritonsillar or retropharyngeal abscess.  He is afebrile nontoxic in no acute distress.  Patient does have pain with swallowing, no difficulty swallowing.  He will be given steroids, antibiotics here in the ED.  Patient will return to emergency room immediately if any new or worsening signs or symptoms present, he will return if symptoms do not improve after several days.  Patient verbalized strict return precautions, had no further questions or concerns at time discharge.   New Prescriptions Discharge Medication List as of 07/27/2016 11:40 AM     I personally performed the services described in this documentation, which was scribed in my presence. The recorded information has been reviewed and is accurate.     Okey Regal, PA-C 07/27/16 [place], MD 07/28/16 1145

## 2016-07-27 NOTE — ED Triage Notes (Signed)
Pt reports three day hx pain on swallowing. Having difficulty swallowing saliva x 2 days. Tx with Aleve and Advil. Denies relief of pain.Posterior pharynx is red, no pustules noted. Swell on l/side

## 2018-12-07 NOTE — Progress Notes (Signed)
COVID-19 Screening performed. Temperature, PHQ-9, and need for medical care and medications assessed. No additional needs assessed at this time.  Clark Cuff MSN, RN 

## 2018-12-09 NOTE — Progress Notes (Signed)
COVID-19 Screening performed. Temperature, PHQ-9, and need for medical care and medications assessed. No additional needs assessed at this time.  Fernande Treiber MSN, RN 

## 2019-01-05 NOTE — Progress Notes (Signed)
COVID-19 Screening performed. Temperature, PHQ-9, and need for medical care and medications assessed. No additional needs assessed at this time.  Sahalie Beth MSN, RN 

## 2020-08-23 ENCOUNTER — Ambulatory Visit: Payer: Self-pay | Admitting: *Deleted

## 2020-08-23 ENCOUNTER — Other Ambulatory Visit: Payer: Self-pay

## 2020-08-23 ENCOUNTER — Ambulatory Visit: Payer: Self-pay | Admitting: Physician Assistant

## 2020-08-23 VITALS — BP 149/85 | HR 66 | Temp 98.2°F | Resp 18 | Ht 70.0 in | Wt 181.0 lb

## 2020-08-23 DIAGNOSIS — Z87898 Personal history of other specified conditions: Secondary | ICD-10-CM

## 2020-08-23 DIAGNOSIS — G4700 Insomnia, unspecified: Secondary | ICD-10-CM

## 2020-08-23 DIAGNOSIS — F439 Reaction to severe stress, unspecified: Secondary | ICD-10-CM

## 2020-08-23 DIAGNOSIS — Z1159 Encounter for screening for other viral diseases: Secondary | ICD-10-CM

## 2020-08-23 DIAGNOSIS — I10 Essential (primary) hypertension: Secondary | ICD-10-CM

## 2020-08-23 DIAGNOSIS — Z13228 Encounter for screening for other metabolic disorders: Secondary | ICD-10-CM

## 2020-08-23 DIAGNOSIS — H43393 Other vitreous opacities, bilateral: Secondary | ICD-10-CM

## 2020-08-23 DIAGNOSIS — Z720 Tobacco use: Secondary | ICD-10-CM

## 2020-08-23 DIAGNOSIS — Z125 Encounter for screening for malignant neoplasm of prostate: Secondary | ICD-10-CM

## 2020-08-23 DIAGNOSIS — Z114 Encounter for screening for human immunodeficiency virus [HIV]: Secondary | ICD-10-CM

## 2020-08-23 DIAGNOSIS — H53133 Sudden visual loss, bilateral: Secondary | ICD-10-CM

## 2020-08-23 LAB — POCT GLYCOSYLATED HEMOGLOBIN (HGB A1C): Hemoglobin A1C: 5.2 % (ref 4.0–5.6)

## 2020-08-23 MED ORDER — TRAZODONE HCL 50 MG PO TABS
25.0000 mg | ORAL_TABLET | Freq: Every evening | ORAL | 1 refills | Status: DC | PRN
  Filled 2020-08-23: qty 30, 30d supply, fill #0

## 2020-08-23 MED ORDER — HYDROCHLOROTHIAZIDE 25 MG PO TABS
25.0000 mg | ORAL_TABLET | Freq: Every day | ORAL | 1 refills | Status: DC
  Filled 2020-08-23: qty 30, 30d supply, fill #0
  Filled 2020-09-30: qty 30, 30d supply, fill #1

## 2020-08-23 NOTE — Progress Notes (Signed)
Patient has eaten today and does not curently take medicattons. Patient reports having a "black-out" episode while driving on the highway. Patient reports feeling nauseous prior to eye sight " going black" Patient shares his vision returned after a few seconds and he felt nauseated and fatigue after. Patient denied any HA or floaters directly after episode. Patient admits to having floaters "all the time" Patient denies any Hx of a similar episode.

## 2020-08-23 NOTE — Telephone Encounter (Addendum)
See correct nurse triage note dated 08/23/20 at 0938. Answer Assessment - Initial Assessment Questions 1. BLOOD PRESSURE: "What is the blood pressure?" "Did you take at least two measurements 5 minutes apart?"     BP at [facility] on 08/22/20 in stage 2 HTN range; not sure of numbers 2. ONSET: "When did you take your blood pressure?"     08/22/20 3. HOW: "How did you obtain the blood pressure?" (e.g., visiting nurse, automatic home BP monitor)     [facility] 4. HISTORY: "Do you have a history of high blood pressure?"    Previously told had HTN 5. MEDICATIONS: "Are you taking any medications for blood pressure?" "Have you missed any doses recently?"   no 6. OTHER SYMPTOMS: "Do you have any symptoms?" (e.g., headache, chest pain, blurred vision, difficulty breathing, weakness)   Headaches x 1-2 seconds; lost vision on 08/21/20 7. PREGNANCY: "Is there any chance you are pregnant?" "When was your last menstrual period?"     n/a  Protocols used: BLOOD PRESSURE - HIGH-A-AH

## 2020-08-23 NOTE — Telephone Encounter (Signed)
Pt called stating he had loss of vision on 08/21/20; he saw "blacked out for 2-3 seconds"; his vision returned immediately; the pt says he took his BP at [facility] 08/22/20 was "at stage 2 HTN"; he is not sure of the number; he has headaches and nausea; the pt has been having headaches for 1-2 months; aspirin has relieved his headaches; the pt says he had previously been diagnosed with increased  Cholesterol but stooped taking the meds after a month due to the side effects; his headaches started a month ago and are concentrated on the right side/top of his head; they are rated 2-3 out of 10 and is constant and nagging;  Recommendations made per nurse triage protocol; pt also given information for [facility] and it's location today; the pts states he will go there after work; the pt originally called to establish care with [facility]; he will be evaluated and call back to establish care.  Reason for Disposition . Systolic BP  >= 086 OR Diastolic >= 578  Answer Assessment - Initial Assessment Questions 1. BLOOD PRESSURE: "What is the blood pressure?" "Did you take at least two measurements 5 minutes apart?"     Answer Assessment - Initial Assessment Questions 1. BLOOD PRESSURE: "What is the blood pressure?" "Did you take at least two measurements 5 minutes apart?"     [facility] 08/[facility]/20 in stage 2 HTN range; not sure of numbers 2. ONSET: "When did you take your blood pressure?"     08/22/20 3. HOW: "How did you obtain the blood pressure?" (e.g., visiting nurse, automatic home BP monitor)     [facility] 4. HISTORY: "Do you have a history of high blood pressure?"    Previously told had HTN 5. MEDICATIONS: "Are you taking any medications for blood pressure?" "Have you missed any doses recently?"   no 6. OTHER SYMPTOMS: "Do you have any symptoms?" (e.g., headache, chest pain, blurred vision, difficulty breathing, weakness)   Headaches x 1-2 months;  lost vision on 08/21/20  seconds; 7.  PREGNANCY: "Is there any chance you are pregnant?" "When was your last menstrual period?"     n/a  Protocols used: BLOOD PRESSURE - HIGH-A-AH

## 2020-08-23 NOTE — Addendum Note (Signed)
Addended by: Addison Naegeli on: 08/23/2020 10:31 AM   Modules accepted: Miquel Dunn

## 2020-08-23 NOTE — Progress Notes (Signed)
New Patient Office Visit  Subjective:  Patient ID: Tyler Pratt, male    DOB: 07-23-1955  Age: 65 y.o. MRN: [id number]  CC:  Chief Complaint  Patient presents with  . Hypertension    HPI Tyler Pratt reports that he felt that his vision went away for a few seconds while driving this past Sunday evening.  Reports that he did not have loss of consciousness, but states that everything went black, and then returned a few seconds later.  Denies eye pain or pressure.  Reports that he did have a feeling of nauseousness prior to and after the event.  Reports that he did not report to the emergency department, however he did go to [facility] to check his blood pressure shortly after, states that he is unsure of the number but states it was elevated.  Does endorse history of elevated blood pressure readings, states he has never been treated for hypertension.  Endorses history of seeing floaters, states this has been present for at least 15 years.  Reports last eye exam was approximately 3 years ago, does wear prescription eyeglasses, states that he was told previously he needed bifocal lenses but has not complied.  States that he does have increased stressors, denies anxiety attacks.  Reports that he has difficulty sleeping, difficulty falling asleep and staying asleep, states that he feels fatigued most days.  Reports that he has tried over-the-counter sleep medications without relief, states a friend gave him trazodone, states that he used 100 mg with relief of insomnia.  Endorses that he wakes several times throughout the night to use the bathroom, states this has been present for several years but has become worse in the past 6 months.  Reports that he drinks approximately 3 bottles of water a day, is drinking approximately 3-5 beers a week, endorses otherwise healthy diet.   Past Medical History:  Diagnosis Date  . High cholesterol     History reviewed. No pertinent surgical history.  Family  History  Problem Relation Age of Onset  . Hypertension Mother   . Cancer Mother   . Hypertension Father   . Cancer Father   . Diabetes Maternal Grandmother   . Heart disease Paternal Grandfather     Social History   Socioeconomic History  . Marital status: Single    Spouse name: Not on file  . Number of children: Not on file  . Years of education: Not on file  . Highest education level: Not on file  Occupational History  . Not on file  Tobacco Use  . Smoking status: Current Every Day Smoker    Packs/day: 1.00    Years: 40.00    Pack years: 40.00    Types: Cigarettes  . Smokeless tobacco: Never Used  Substance and Sexual Activity  . Alcohol use: Yes    Comment: weekly   . Drug use: Yes    Types: Cocaine    Comment: crack  . Sexual activity: Not on file  Other Topics Concern  . Not on file  Social History Narrative  . Not on file   Social Determinants of Health   Financial Resource Strain: Not on file  Food Insecurity: Not on file  Transportation Needs: Not on file  Physical Activity: Not on file  Stress: Not on file  Social Connections: Not on file  Intimate Partner Violence: Not on file    ROS Review of Systems  Constitutional: Positive for fatigue. Negative for chills and fever.  HENT:  Negative.   Eyes: Positive for visual disturbance. Negative for pain and redness.  Respiratory: Negative for shortness of breath.   Cardiovascular: Negative for chest pain.  Gastrointestinal: Negative for nausea and vomiting.  Endocrine: Negative.   Genitourinary: Positive for frequency.  Musculoskeletal: Negative.   Skin: Negative.   Allergic/Immunologic: Negative.   Neurological: Negative for dizziness, speech difficulty, weakness and headaches.  Hematological: Negative.   Psychiatric/Behavioral: Positive for sleep disturbance. Negative for dysphoric mood, self-injury and suicidal ideas. The patient is nervous/anxious.     Objective:   Today's Vitals: BP (!)  149/85 (BP Location: Left Arm, Patient Position: Sitting, Cuff Size: Normal)   Pulse 66   Temp 98.2 F (36.8 C) (Oral)   Resp 18   Ht 5\' 10"  (1.778 m)   Wt 181 lb (82.1 kg)   SpO2 99%   BMI 25.97 kg/m   Physical Exam Vitals and nursing note reviewed.  Constitutional:      General: He is not in acute distress.    Appearance: Normal appearance. He is not ill-appearing.  HENT:     Head: Normocephalic and atraumatic.     Right Ear: Tympanic membrane, ear canal and external ear normal.     Left Ear: Tympanic membrane, ear canal and external ear normal.     Nose: Nose normal.     Mouth/Throat:     Mouth: Mucous membranes are moist.     Pharynx: Oropharynx is clear.  Eyes:     General: Lids are normal.     Extraocular Movements: Extraocular movements intact.     Conjunctiva/sclera: Conjunctivae normal.     Right eye: Right conjunctiva is not injected.     Left eye: Left conjunctiva is not injected.     Pupils: Pupils are equal, round, and reactive to light.  Cardiovascular:     Rate and Rhythm: Normal rate and regular rhythm.     Pulses: Normal pulses.     Heart sounds: Normal heart sounds.  Pulmonary:     Effort: Pulmonary effort is normal.     Breath sounds: Normal breath sounds.  Musculoskeletal:        General: Normal range of motion.     Cervical back: Normal range of motion and neck supple.  Skin:    General: Skin is warm and dry.  Neurological:     General: No focal deficit present.     Mental Status: He is alert and oriented to person, place, and time.     Cranial Nerves: No cranial nerve deficit.  Psychiatric:        Mood and Affect: Mood normal.        Behavior: Behavior normal.        Thought Content: Thought content normal.        Judgment: Judgment normal.     Assessment & Plan:   Problem List Items Addressed This Visit      Cardiovascular and Mediastinum   Essential hypertension - Primary   Relevant Medications   hydrochlorothiazide (HYDRODIURIL)  25 MG tablet   Other Relevant Orders   CBC with Differential/Platelet   Comp. Metabolic Panel (12)     Endocrine   History of prediabetes   Relevant Orders   HgB A1c (Completed)     Other   Sudden visual loss of both eyes   Relevant Orders   TSH   Vitreous floaters of both eyes   Stress   Insomnia   Relevant Medications   traZODone (DESYREL) 50 MG  tablet   Tobacco abuse    Other Visit Diagnoses    Screening PSA (prostate specific antigen)       Relevant Orders   PSA   Encounter for HCV screening test for low risk patient       Relevant Orders   HCV Ab w Reflex to Quant PCR   Screening for HIV without presence of risk factors       Relevant Orders   HIV antibody (with reflex)   Screening for metabolic disorder       Relevant Orders   Lipid panel      Outpatient Encounter Medications as of 08/23/2020  Medication Sig  . hydrochlorothiazide (HYDRODIURIL) 25 MG tablet Take 1 tablet (25 mg total) by mouth daily.  . traZODone (DESYREL) 50 MG tablet Take 0.5-1 tablets (25-50 mg total) by mouth at bedtime as needed for sleep.  . [DISCONTINUED] ciprofloxacin (CIPRO) 500 MG tablet Take 1 tablet (500 mg total) by mouth 2 (two) times daily.  . [DISCONTINUED] HYDROcodone-acetaminophen (NORCO) 7.5-325 MG per tablet Take 1 tablet by mouth once.  . [DISCONTINUED] HYDROcodone-acetaminophen (NORCO/VICODIN) 5-325 MG per tablet Take 1-2 tablets by mouth every 6 (six) hours as needed for moderate pain.   No facility-administered encounter medications on file as of 08/23/2020.  1. Essential hypertension Trial hydrochlorothiazide 25 mg, patient education given on low-sodium diet, encouraged to check blood pressure on a daily basis, keep a written log and have available for all office visits.  Patient given appointment to establish care with Geryl Rankins on October 21, 2020.  Patient to return to mobile unit tomorrow for fasting labs.  Patient to return to mobile unit in 4 weeks for blood pressure  follow-up.  Patient given information on [place] financial assistance. - hydrochlorothiazide (HYDRODIURIL) 25 MG tablet; Take 1 tablet (25 mg total) by mouth daily.  Dispense: 30 tablet; Refill: 1 - CBC with Differential/Platelet; Future - Comp. Metabolic Panel (12); Future  2. History of prediabetes A1c within normal limits today - HgB A1c  3. Sudden visual loss of both eyes Patient strongly encouraged to follow-up with ophthalmology as soon as possible.  Red flags given for prompt reevaluation - TSH; Future  4. Vitreous floaters of both eyes   5. Stress Patient education given on stress relieving lifestyle modifications  6. Insomnia, unspecified type Trial trazodone, patient education given on good sleep hygiene - traZODone (DESYREL) 50 MG tablet; Take 0.5-1 tablets (25-50 mg total) by mouth at bedtime as needed for sleep.  Dispense: 30 tablet; Refill: 1  7. Screening PSA (prostate specific antigen)  - PSA; Future  8. Encounter for HCV screening test for low risk patient  - HCV Ab w Reflex to Quant PCR; Future  9. Screening for HIV without presence of risk factors  - HIV antibody (with reflex); Future  10. Screening for metabolic disorder  - Lipid panel; Future   I have reviewed the patient's medical history (PMH, PSH, Social History, Family History, Medications, and allergies) , and have been updated if relevant. I spent 36 minutes reviewing chart and  face to face time with patient.     Follow-up: Return in about 1 day (around 08/24/2020) for Fasting  labs.   Loraine Grip Mayers, PA-C

## 2020-08-23 NOTE — Patient Instructions (Signed)
For your elevated blood pressure, you will take hydrochlorothiazide 25 mg once a day in the morning.  I do encourage you to purchase a blood pressure cuff, check your blood pressure on a daily basis, keep a written log and have available for all office visits.  I do encourage you to increase your water intake as well.  For your insomnia, you will use 25 to 50 mg of trazodone, I encourage you to work on getting 7 hours of quality sleep a night.  I encourage you to follow-up with an eye doctor as soon as possible for a vision check.  Please return to the mobile unit tomorrow morning for fasting labs, we will call you with those lab results.  I do encourage you to return to the mobile unit in approximately 4 weeks to recheck your blood pressure.  Please let us know if there is anything else we can do for you.  Tyler Rad, PA-C Physician Assistant [facility] [email or web address]    Amaurosis Fugax Amaurosis fugax, also called transient visual loss, is a condition in which a person loses sight in one eye or, rarely, both eyes for a short time. The vision loss in the affected eye may be total or partial. The vision loss usually lasts for only a few seconds or minutes before sight returns to normal. In some cases, vision loss may last for several hours. This condition is typically caused by interruption of blood flow in the artery that supplies blood to the retina. The retina is the part of the eye that contains the nerves needed for sight. This condition can be a warning sign that a stroke may happen, either in the eye or in the brain. A stroke can result in permanent vision loss or loss of other body functions. What are the causes? This condition is caused by a loss or interruption of blood flow to the retinal artery. Causes for the change in blood flow include:  A buildup of cholesterol and fats, or plaque, in the artery (atherosclerosis).  If any plaque breaks off and gets into the bloodstream, it can travel to other blood vessels, such as the retinal artery.  Diseases of the heart valves.  Certain blood conditions, such as sickle cell anemia, leukemia, and blood clotting (coagulation) disorders.  Inflammation of the arteries (vasculitis).  A fast or irregular heartbeat, such as atrial fibrillation.  Family history of stroke. What increases the risk? The following factors may make you more likely to develop this condition:  Use of any tobacco products, including cigarettes, chewing tobacco, or electronic cigarettes.  Poorly controlled diabetes.  Conditions that can lead to diseases of the heart and blood vessels (cardiovascular diseases), such as: ? High blood pressure (hypertension). ? High cholesterol.  Drinking too much alcohol regularly.  Use of drugs, especially cocaine.  Age. The risk increases with age. What are the signs or symptoms? The main symptom of this condition is painless, sudden loss of vision in one eye. The vision loss often starts at the top and moves down, as if a curtain is being pulled down over your eye. This is usually followed by a quick return of vision. However, symptoms may last for several hours. It is important to seek medical care right away even if your symptoms go away. How is this diagnosed? This condition is diagnosed by:  Medical history and physical exam.  Eye exam, including dilating drops and looking at the back of your eyes.  Carotid ultrasound.  This checks for plaque in the carotid arteries in your neck.  Magnetic resonance angiography (MRA). This checks the carotid artery and the branches that supply the brain. MRA looks for areas of blockage or disease. You may also have other tests, including:  Blood tests.  Electrocardiogram (ECG) to check your heart rhythm.  Echocardiogram (ECHO) to check your heart function. How is this treated? Emergency treatment for this  condition may involve massaging the eyeball or using certain breathing techniques to remove or ease the blockage of the retinal artery. You may also get an emergency referral to a clinic or medical center that treats strokes. Other treatments focus on reducing your risk of having a stroke in the future. These may include:  Medicines, such as medicines to manage high blood pressure, diabetes, or cholesterol, or to thin the blood.  Surgical procedures, such as: ? Carotid endarterectomy to remove plaque from the carotid artery. ? Carotid angioplasty and placement of a small, mesh tube (stenting) to open the blocked part of the artery.  Lifestyle changes, such as stopping tobacco use, changing your diet, and getting enough exercise. Follow these instructions at home: Medicines  Take over-the-counter and prescription medicines only as told by your health care provider.  If you are taking blood thinners: ? Talk with your health care provider before you take any medicines that contain aspirin or NSAIDs, such as ibuprofen. These medicines increase your risk for dangerous bleeding. ? Take your medicine exactly as told, at the same time every day. ? Avoid activities that could cause injury or bruising, and follow instructions about how to prevent falls. ? Wear a medical alert bracelet or carry a card that lists what medicines you take. Eating and drinking  Eat a diet that includes five or more servings of fruits and vegetables each day. This may reduce the risk of stroke.  Certain diets may help with high blood pressure, high cholesterol, diabetes, or obesity. These include: ? A diet that is low in salt (sodium) to manage high blood pressure. ? A high-fiber diet that is low in saturated fat, trans fat, and cholesterol to control cholesterol levels. ? A low-carbohydrate, low-sugar diet to manage diabetes. ? A reduced-calorie diet that is low in sodium, saturated fat, trans fat, and cholesterol to  manage obesity.   Lifestyle  Maintain a healthy weight.  Stay physically active. Try to get at least 30 minutes of activity on most or all days.  Do not use any products that contain nicotine or tobacco, such as cigarettes, e-cigarettes, and chewing tobacco. If you need help quitting, ask your health care provider.  Do not misuse drugs.   General instructions  Do not drink alcohol if: ? Your health care provider tells you not to drink. ? You are pregnant, may be pregnant, or are planning to become pregnant.  If you drink alcohol: ? Limit how much you use to:  0-1 drink a day for women.  0-2 drinks a day for men. ? Be aware of how much alcohol is in your drink. In the [place] one drink equals one 12 oz bottle of beer (355 mL), one 5 oz glass of wine (148 mL), or one 1 oz glass of hard liquor (44 mL).  Keep all follow-up visits as told by your health care provider. This is important. Contact a health care provider if:  You lose vision in one eye or both eyes. Get help right away if:  You have chest pain or an irregular heartbeat.  You have any symptoms of a stroke. "BE FAST" is an easy way to remember the main warning signs of a stroke. ? B - Balance. Signs are dizziness, sudden trouble walking, or loss of balance. ? E - Eyes. Signs are trouble seeing or a sudden change in vision. ? F - Face. Signs are sudden weakness or numbness of the face, or the face or eyelid drooping on one side. ? A - Arms. Signs are weakness or numbness in an arm. This happens suddenly and usually on one side of the body. ? S - Speech. Signs are sudden trouble speaking, slurred speech, or trouble understanding what people say. ? T - Time. Time to call emergency services. Write down what time symptoms started.  You have other signs of a stroke, such as: ? A sudden, severe headache with no known cause. ? Nausea or vomiting. ? Seizure. These symptoms may represent a serious problem that is an emergency.  Do not wait to see if the symptoms will go away. Get medical help right away. Call your local emergency services (911 in the [place] Do not drive yourself to the hospital.   Summary  Amaurosis fugax is a condition in which you lose sight for a short time.  This condition can be a warning sign that a stroke may happen. A stroke can result in permanent vision loss or loss of other body functions.  Seek medical care right away even if your symptoms go away. This information is not intended to replace advice given to you by your health care provider. Make sure you discuss any questions you have with your health care provider. Document Revised: 10/15/2018 Document Reviewed: 10/15/2018 Elsevier Patient Education  [employer].   How to Take Your Blood Pressure Blood pressure is a measurement of how strongly your blood is pressing against the walls of your arteries. Arteries are blood vessels that carry blood from your heart throughout your body. Your health care provider takes your blood pressure at each office visit. You can also take your own blood pressure at home with a blood pressure monitor. You may need to take your own blood pressure to:  Confirm a diagnosis of high blood pressure (hypertension).  Monitor your blood pressure over time.  Make sure your blood pressure medicine is working. Supplies needed:  Blood pressure monitor.  Dining room chair to sit in.  Table or desk.  Small notebook and pencil or pen. How to prepare To get the most accurate reading, avoid the following for 30 minutes before you check your blood pressure:  Drinking caffeine.  Drinking alcohol.  Eating.  Smoking.  Exercising. Five minutes before you check your blood pressure:  Use the bathroom and urinate so that you have an empty bladder.  Sit quietly in a dining room chair. Do not sit in a soft couch or an armchair. Do not talk. How to take your blood pressure To check your blood  pressure, follow the instructions in the manual that came with your blood pressure monitor. If you have a digital blood pressure monitor, the instructions may be as follows: 1. Sit up straight in a chair. 2. Place your feet on the floor. Do not cross your ankles or legs. 3. Rest your left arm at the level of your heart on a table or desk or on the arm of a chair. 4. Pull up your shirt sleeve. 5. Wrap the blood pressure cuff around the upper part of your left arm, 1 inch (  2.5 cm) above your elbow. It is best to wrap the cuff around bare skin. 6. Fit the cuff snugly around your arm. You should be able to place only one finger between the cuff and your arm. 7. Position the cord so that it rests in the bend of your elbow. 8. Press the power button. 9. Sit quietly while the cuff inflates and deflates. 10. Read the digital reading on the monitor screen and write the numbers down (record them) in a notebook. 11. Wait 2-3 minutes, then repeat the steps, starting at step 1.   What does my blood pressure reading mean? A blood pressure reading consists of a higher number over a lower number. Ideally, your blood pressure should be below 120/80. The first ("top") number is called the systolic pressure. It is a measure of the pressure in your arteries as your heart beats. The second ("bottom") number is called the diastolic pressure. It is a measure of the pressure in your arteries as the heart relaxes. Blood pressure is classified into five stages. The following are the stages for adults who do not have a short-term serious illness or a chronic condition. Systolic pressure and diastolic pressure are measured in a unit called mm Hg (millimeters of mercury).  Normal  Systolic pressure: below 123456.  Diastolic pressure: below 80. Elevated  Systolic pressure: [id number].  Diastolic pressure: below 80. Hypertension stage 1  Systolic pressure: 0000000.  Diastolic pressure: [id number]. Hypertension stage  2  Systolic pressure: [id number] or above.  Diastolic pressure: 90 or above. You can have elevated blood pressure or hypertension even if only the systolic or only the diastolic number in your reading is higher than normal. Follow these instructions at home:  Check your blood pressure as often as recommended by your health care provider.  Check your blood pressure at the same time every day.  Take your monitor to the next appointment with your health care provider to make sure that: ? You are using it correctly. ? It provides accurate readings.  Be sure you understand what your goal blood pressure numbers are.  Tell your health care provider if you are having any side effects from blood pressure medicine.  Keep all follow-up visits as told by your health care provider. This is important. General tips  Your health care provider can suggest a reliable monitor that will meet your needs. There are several types of home blood pressure monitors.  Choose a monitor that has an arm cuff. Do not choose a monitor that measures your blood pressure from your wrist or finger.  Choose a cuff that wraps snugly around your upper arm. You should be able to fit only one finger between your arm and the cuff.  You can buy a blood pressure monitor at most drugstores or online. Where to find more information American Heart Association: www.heart.org Contact a health care provider if:  Your blood pressure is consistently high. Get help right away if:  Your systolic blood pressure is higher than 180.  Your diastolic blood pressure is higher than 120. Summary  Blood pressure is a measurement of how strongly your blood is pressing against the walls of your arteries.  A blood pressure reading consists of a higher number over a lower number. Ideally, your blood pressure should be below 120/80.  Check your blood pressure at the same time every day.  Avoid caffeine, alcohol, smoking, and exercise for 30  minutes prior to checking your blood pressure. These agents can affect the  accuracy of the blood pressure reading. This information is not intended to replace advice given to you by your health care provider. Make sure you discuss any questions you have with your health care provider. Document Revised: 03/27/2019 Document Reviewed: 03/27/2019 Elsevier Patient Education  2021 [employer].

## 2020-08-24 ENCOUNTER — Other Ambulatory Visit: Payer: Self-pay

## 2020-08-24 DIAGNOSIS — I10 Essential (primary) hypertension: Secondary | ICD-10-CM

## 2020-08-24 DIAGNOSIS — H53133 Sudden visual loss, bilateral: Secondary | ICD-10-CM

## 2020-08-24 DIAGNOSIS — Z720 Tobacco use: Secondary | ICD-10-CM | POA: Insufficient documentation

## 2020-08-24 DIAGNOSIS — Z13228 Encounter for screening for other metabolic disorders: Secondary | ICD-10-CM

## 2020-08-24 DIAGNOSIS — G4700 Insomnia, unspecified: Secondary | ICD-10-CM | POA: Insufficient documentation

## 2020-08-24 DIAGNOSIS — F439 Reaction to severe stress, unspecified: Secondary | ICD-10-CM | POA: Insufficient documentation

## 2020-08-24 DIAGNOSIS — Z125 Encounter for screening for malignant neoplasm of prostate: Secondary | ICD-10-CM

## 2020-08-24 DIAGNOSIS — Z1159 Encounter for screening for other viral diseases: Secondary | ICD-10-CM

## 2020-08-24 DIAGNOSIS — Z87898 Personal history of other specified conditions: Secondary | ICD-10-CM | POA: Insufficient documentation

## 2020-08-24 DIAGNOSIS — Z114 Encounter for screening for human immunodeficiency virus [HIV]: Secondary | ICD-10-CM

## 2020-08-24 DIAGNOSIS — H43393 Other vitreous opacities, bilateral: Secondary | ICD-10-CM | POA: Insufficient documentation

## 2020-08-24 NOTE — Addendum Note (Signed)
Addended by: Trecia Rogers on: 08/24/2020 10:30 AM   Modules accepted: Orders

## 2020-08-24 NOTE — Progress Notes (Signed)
Patient tolerated blood draw well today.  Patient is aware of receiving the results on Monday via telephone.

## 2020-08-25 LAB — COMP. METABOLIC PANEL (12)
AST: 18 IU/L (ref 0–40)
Albumin/Globulin Ratio: 1.7 (ref 1.2–2.2)
Albumin: 4.5 g/dL (ref 3.8–4.8)
Alkaline Phosphatase: 93 IU/L (ref 44–121)
BUN/Creatinine Ratio: 11 (ref 10–24)
BUN: 12 mg/dL (ref 8–27)
Bilirubin Total: 0.4 mg/dL (ref 0.0–1.2)
Calcium: 9 mg/dL (ref 8.6–10.2)
Chloride: 99 mmol/L (ref 96–106)
Creatinine, Ser: 1.12 mg/dL (ref 0.76–1.27)
Globulin, Total: 2.6 g/dL (ref 1.5–4.5)
Glucose: 101 mg/dL — ABNORMAL HIGH (ref 65–99)
Potassium: 4.7 mmol/L (ref 3.5–5.2)
Sodium: 140 mmol/L (ref 134–144)
Total Protein: 7.1 g/dL (ref 6.0–8.5)
eGFR: 74 mL/min/{1.73_m2} (ref >59)

## 2020-08-25 LAB — CBC WITH DIFFERENTIAL/PLATELET
Basophils Absolute: 0.1 10*3/uL (ref 0.0–0.2)
Basos: 1 %
EOS (ABSOLUTE): 0.1 10*3/uL (ref 0.0–0.4)
Eos: 1 %
Hematocrit: 45.2 % (ref 37.5–51.0)
Hemoglobin: 14.8 g/dL (ref 13.0–17.7)
Immature Grans (Abs): 0 10*3/uL (ref 0.0–0.1)
Immature Granulocytes: 0 %
Lymphocytes Absolute: 1.9 10*3/uL (ref 0.7–3.1)
Lymphs: 20 %
MCH: 30 pg (ref 26.6–33.0)
MCHC: 32.7 g/dL (ref 31.5–35.7)
MCV: 92 fL (ref 79–97)
Monocytes Absolute: 0.4 10*3/uL (ref 0.1–0.9)
Monocytes: 4 %
Neutrophils Absolute: 7.2 10*3/uL — ABNORMAL HIGH (ref 1.4–7.0)
Neutrophils: 74 %
Platelets: 280 10*3/uL (ref 150–450)
RBC: 4.93 x10E6/uL (ref 4.14–5.80)
RDW: 12.8 % (ref 11.6–15.4)
WBC: 9.7 10*3/uL (ref 3.4–10.8)

## 2020-08-25 LAB — LIPID PANEL
Chol/HDL Ratio: 4.3 ratio (ref 0.0–5.0)
Cholesterol, Total: 273 mg/dL — ABNORMAL HIGH (ref 100–199)
HDL: 63 mg/dL (ref >39)
LDL Chol Calc (NIH): 196 mg/dL — ABNORMAL HIGH (ref 0–99)
Triglycerides: 84 mg/dL (ref 0–149)
VLDL Cholesterol Cal: 14 mg/dL (ref 5–40)

## 2020-08-25 LAB — HCV INTERPRETATION

## 2020-08-25 LAB — HIV ANTIBODY (ROUTINE TESTING W REFLEX): HIV Screen 4th Generation wRfx: NONREACTIVE

## 2020-08-25 LAB — TSH: TSH: 0.938 u[IU]/mL (ref 0.450–4.500)

## 2020-08-25 LAB — HCV AB W REFLEX TO QUANT PCR: HCV Ab: 0.1 s/co ratio (ref 0.0–0.9)

## 2020-08-25 LAB — PSA: Prostate Specific Ag, Serum: 621 ng/mL — ABNORMAL HIGH (ref 0.0–4.0)

## 2020-08-30 NOTE — Progress Notes (Signed)
Patient will report to [facility] to discuss labs on tomorrow at 3:20.

## 2020-08-31 ENCOUNTER — Other Ambulatory Visit: Payer: Self-pay

## 2020-08-31 ENCOUNTER — Ambulatory Visit: Payer: Self-pay | Admitting: Physician Assistant

## 2020-08-31 VITALS — BP 155/81 | HR 83 | Temp 98.2°F | Resp 18 | Ht 70.0 in | Wt 181.0 lb

## 2020-08-31 DIAGNOSIS — I10 Essential (primary) hypertension: Secondary | ICD-10-CM

## 2020-08-31 DIAGNOSIS — R972 Elevated prostate specific antigen [PSA]: Secondary | ICD-10-CM

## 2020-08-31 DIAGNOSIS — E7841 Elevated Lipoprotein(a): Secondary | ICD-10-CM

## 2020-08-31 DIAGNOSIS — Z789 Other specified health status: Secondary | ICD-10-CM

## 2020-08-31 MED ORDER — EZETIMIBE 10 MG PO TABS
10.0000 mg | ORAL_TABLET | Freq: Every day | ORAL | 3 refills | Status: DC
  Filled 2020-08-31: qty 30, 30d supply, fill #0
  Filled 2020-09-30: qty 30, 30d supply, fill #1
  Filled 2020-12-20: qty 30, 30d supply, fill #2
  Filled 2021-01-23: qty 30, 30d supply, fill #3

## 2020-08-31 NOTE — Progress Notes (Signed)
Established Patient Office Visit  Subjective:  Patient ID: Tyler Pratt, male    DOB: [birth year]  Age: 65 y.o. MRN: [id number]  CC:  Chief Complaint  Patient presents with  . Results    HPI Tyler Pratt presents to discuss his lab results.  Reports that he previously was treated for elevated cholesterol.  States that he was unable to tolerate the cholesterol medication due to muscle aches.  Does not recall which statin he was taking.   The 10-year ASCVD risk score Mikey Bussing DC Brooke Bonito., et al., 2013) is: 34%   Values used to calculate the score:     Age: 57 years     Sex: Male     Is Non-Hispanic African American: Yes     Diabetic: No     Tobacco smoker: Yes     Systolic Blood Pressure: 888 mmHg     Is BP treated: Yes     HDL Cholesterol: 63 mg/dL     Total Cholesterol: 273 mg/dL  Reports that he was previously told that he had an enlarged prostate, does endorse maternal uncle passing from prostate cancer, unsure of age.  No new concerns at this time  Past Medical History:  Diagnosis Date  . High cholesterol     No past surgical history on file.  Family History  Problem Relation Age of Onset  . Hypertension Mother   . Cancer Mother   . Hypertension Father   . Cancer Father   . Diabetes Maternal Grandmother   . Heart disease Paternal Grandfather     Social History   Socioeconomic History  . Marital status: Single    Spouse name: Not on file  . Number of children: Not on file  . Years of education: Not on file  . Highest education level: Not on file  Occupational History  . Not on file  Tobacco Use  . Smoking status: Current Every Day Smoker    Packs/day: 1.00    Years: 40.00    Pack years: 40.00    Types: Cigarettes  . Smokeless tobacco: Never Used  Substance and Sexual Activity  . Alcohol use: Yes    Comment: weekly   . Drug use: Yes    Types: Cocaine    Comment: crack  . Sexual activity: Not on file  Other Topics Concern  . Not on file  Social  History Narrative  . Not on file   Social Determinants of Health   Financial Resource Strain: Not on file  Food Insecurity: Not on file  Transportation Needs: Not on file  Physical Activity: Not on file  Stress: Not on file  Social Connections: Not on file  Intimate Partner Violence: Not on file    Outpatient Medications Prior to Visit  Medication Sig Dispense Refill  . hydrochlorothiazide (HYDRODIURIL) 25 MG tablet Take 1 tablet (25 mg total) by mouth daily. 30 tablet 1  . traZODone (DESYREL) 50 MG tablet Take 0.5-1 tablets (25-50 mg total) by mouth at bedtime as needed for sleep. 30 tablet 1   No facility-administered medications prior to visit.    No Known Allergies  ROS Review of Systems  Constitutional: Negative for fever.  HENT: Negative.   Eyes: Negative.   Respiratory: Negative for shortness of breath.   Cardiovascular: Negative for chest pain.  Gastrointestinal: Negative.   Endocrine: Negative.   Genitourinary: Negative for difficulty urinating and hematuria.  Musculoskeletal: Negative.   Skin: Negative.   Allergic/Immunologic: Negative.   Neurological:  Negative.   Hematological: Negative.   Psychiatric/Behavioral: Negative.       Objective:    Physical Exam Vitals and nursing note reviewed.  Constitutional:      Appearance: Normal appearance.  HENT:     Head: Normocephalic and atraumatic.     Right Ear: External ear normal.     Left Ear: External ear normal.     Nose: Nose normal.     Mouth/Throat:     Mouth: Mucous membranes are moist.     Pharynx: Oropharynx is clear.  Eyes:     Extraocular Movements: Extraocular movements intact.     Conjunctiva/sclera: Conjunctivae normal.     Pupils: Pupils are equal, round, and reactive to light.  Cardiovascular:     Rate and Rhythm: Normal rate and regular rhythm.     Pulses: Normal pulses.     Heart sounds: Normal heart sounds.  Pulmonary:     Effort: Pulmonary effort is normal.     Breath sounds:  Normal breath sounds.  Musculoskeletal:        General: Normal range of motion.     Cervical back: Normal range of motion and neck supple.  Skin:    General: Skin is warm and dry.  Neurological:     General: No focal deficit present.     Mental Status: He is alert and oriented to person, place, and time.  Psychiatric:        Mood and Affect: Mood normal.        Behavior: Behavior normal.        Thought Content: Thought content normal.        Judgment: Judgment normal.     BP (!) 155/81 (BP Location: Left Arm, Patient Position: Sitting, Cuff Size: Normal)   Pulse 83   Temp 98.2 F (36.8 C) (Oral)   Resp 18   Ht _0  (1.778 m)   Wt 181 lb (82.1 kg)   SpO2 100%   BMI 25.97 kg/m  Wt Readings from Last 3 Encounters:  08/31/20 181 lb (82.1 kg)  08/23/20 181 lb (82.1 kg)  07/27/16 175 lb (79.4 kg)     Health Maintenance Due  Topic Date Due  . COVID-19 Vaccine (1) Never done  . TETANUS/TDAP  Never done  . COLONOSCOPY (Pts 45-68yr Insurance coverage will need to be confirmed)  Never done    There are no preventive care reminders to display for this patient.  Lab Results  Component Value Date   TSH 0.938 08/24/2020   Lab Results  Component Value Date   WBC 9.7 08/24/2020   HGB 14.8 08/24/2020   HCT 45.2 08/24/2020   MCV 92 08/24/2020   PLT 280 08/24/2020   Lab Results  Component Value Date   NA 140 08/24/2020   K 4.7 08/24/2020   CO2 23 02/16/2007   GLUCOSE 101 (H) 08/24/2020   BUN 12 08/24/2020   CREATININE 1.12 08/24/2020   BILITOT 0.4 08/24/2020   ALKPHOS 93 08/24/2020   AST 18 08/24/2020   ALT 16 04/02/2013   PROT 7.1 08/24/2020   ALBUMIN 4.5 08/24/2020   CALCIUM 9.0 08/24/2020   EGFR 74 08/24/2020   Lab Results  Component Value Date   CHOL 273 (H) 08/24/2020   Lab Results  Component Value Date   HDL 63 08/24/2020   Lab Results  Component Value Date   LDLCALC 196 (H) 08/24/2020   Lab Results  Component Value Date   TRIG 84 08/24/2020  Lab Results  Component Value Date   CHOLHDL 4.3 08/24/2020   Lab Results  Component Value Date   HGBA1C 5.2 08/23/2020      Assessment & Plan:   Problem List Items Addressed This Visit      Cardiovascular and Mediastinum   Essential hypertension   Relevant Medications   ezetimibe (ZETIA) 10 MG tablet     Other   Elevated PSA, greater than or equal to 20 ng/ml - Primary   Relevant Orders   Ambulatory referral to Urology   Elevated lipoprotein(a)   Relevant Medications   ezetimibe (ZETIA) 10 MG tablet   Statin intolerance     1. Elevated PSA, greater than or equal to 20 ng/ml Patient strongly encouraged to follow-up with urology, continue application for [facility] health financial assistance.  Patient understands and agrees - Ambulatory referral to Urology  2. Essential hypertension Patient to continue regimen prescribed last week continue follow-up with mobile unit to review  3. Elevated lipoprotein(a) Trial of Zetia.  Patient education given on low-cholesterol diet - ezetimibe (ZETIA) 10 MG tablet; Take 1 tablet (10 mg total) by mouth daily.  Dispense: 90 tablet; Refill: 3  4. Statin intolerance    I have reviewed the patient's medical history (PMH, PSH, Social History, Family History, Medications, and allergies) , and have been updated if relevant. I spent 31 minutes reviewing chart and  face to face time with patient.     Meds ordered this encounter  Medications  . ezetimibe (ZETIA) 10 MG tablet    Sig: Take 1 tablet (10 mg total) by mouth daily.    Dispense:  90 tablet    Refill:  3    Order Specific Question:   Supervising Provider    Answer:   Elsie Stain [[id number]]    Follow-up: Return in about 3 weeks (around 09/21/2020).    Loraine Grip Mayers, PA-C

## 2020-08-31 NOTE — Patient Instructions (Signed)
For your elevated PSA, I have started a referral for you to be seen by urology.  Please make sure to follow-up with them promptly.  For your elevated cholesterol levels, you will start taking Zetia once a day.  I encourage you to follow a low-cholesterol diet as well.  Please return to the mobile unit in 2 to 3 weeks for follow-up on your blood pressure.  Kennieth Rad, PA-C Physician Assistant [facility] [email or web address]      Mediterranean Diet A Mediterranean diet refers to food and lifestyle choices that are based on the traditions of countries located on the [place]. This way of eating has been shown to help prevent certain conditions and improve outcomes for people who have chronic diseases, like kidney disease and heart disease. What are tips for following this plan? Lifestyle  Cook and eat meals together with your family, when possible.  Drink enough fluid to keep your urine clear or pale yellow.  Be physically active every day. This includes: ? Aerobic exercise like running or swimming. ? Leisure activities like gardening, walking, or housework.  Get 7-8 hours of sleep each night.  If recommended by your health care provider, drink red wine in moderation. This means 1 glass a day for nonpregnant women and 2 glasses a day for men. A glass of wine equals 5 oz (150 mL). Reading food labels  Check the serving size of packaged foods. For foods such as rice and pasta, the serving size refers to the amount of cooked product, not dry.  Check the total fat in packaged foods. Avoid foods that have saturated fat or trans fats.  Check the ingredients list for added sugars, such as corn syrup.   Shopping  At the grocery store, buy most of your food from the areas near the walls of the store. This includes: ? Fresh fruits and vegetables (produce). ? Grains, beans, nuts, and seeds. Some of these may be available  in unpackaged forms or large amounts (in bulk). ? Fresh seafood. ? Poultry and eggs. ? Low-fat dairy products.  Buy whole ingredients instead of prepackaged foods.  Buy fresh fruits and vegetables in-season from local farmers markets.  Buy frozen fruits and vegetables in resealable bags.  If you do not have access to quality fresh seafood, buy precooked frozen shrimp or canned fish, such as tuna, salmon, or sardines.  Buy small amounts of raw or cooked vegetables, salads, or olives from the deli or salad bar at your store.  Stock your pantry so you always have certain foods on hand, such as olive oil, canned tuna, canned tomatoes, rice, pasta, and beans. Cooking  Cook foods with extra-virgin olive oil instead of using butter or other vegetable oils.  Have meat as a side dish, and have vegetables or grains as your main dish. This means having meat in small portions or adding small amounts of meat to foods like pasta or stew.  Use beans or vegetables instead of meat in common dishes like chili or lasagna.  Experiment with different cooking methods. Try roasting or broiling vegetables instead of steaming or sauteing them.  Add frozen vegetables to soups, stews, pasta, or rice.  Add nuts or seeds for added healthy fat at each meal. You can add these to yogurt, salads, or vegetable dishes.  Marinate fish or vegetables using olive oil, lemon juice, garlic, and fresh herbs. Meal planning  Plan to eat 1 vegetarian meal one day each week. Try to work up  to 2 vegetarian meals, if possible.  Eat seafood 2 or more times a week.  Have healthy snacks readily available, such as: ? Vegetable sticks with hummus. ? [place] yogurt. ? Fruit and nut trail mix.  Eat balanced meals throughout the week. This includes: ? Fruit: 2-3 servings a day ? Vegetables: 4-5 servings a day ? Low-fat dairy: 2 servings a day ? Fish, poultry, or lean meat: 1 serving a day ? Beans and legumes: 2 or more  servings a week ? Nuts and seeds: 1-2 servings a day ? Whole grains: 6-8 servings a day ? Extra-virgin olive oil: 3-4 servings a day  Limit red meat and sweets to only a few servings a month   What are my food choices?  Mediterranean diet ? Recommended  Grains: Whole-grain pasta. Brown rice. Bulgar wheat. Polenta. Couscous. Whole-wheat bread. [place].  Vegetables: Artichokes. Beets. Broccoli. Cabbage. Carrots. Eggplant. Green beans. Chard. Kale. Spinach. Onions. Leeks. Peas. Squash. Tomatoes. Peppers. Radishes.  Fruits: Apples. Apricots. Avocado. Berries. Bananas. Cherries. Dates. Figs. Grapes. Lemons. Melon. Oranges. Peaches. Plums. Pomegranate.  Meats and other protein foods: Beans. Almonds. Sunflower seeds. Pine nuts. Peanuts. [place]. Salmon. Scallops. Shrimp. [place]. Tilapia. Clams. Oysters. Eggs.  Dairy: Low-fat milk. Cheese. Greek yogurt.  Beverages: Water. Red wine. Herbal tea.  Fats and oils: Extra virgin olive oil. Avocado oil. Grape seed oil.  Sweets and desserts: [place] yogurt with honey. Baked apples. Poached pears. Trail mix.  Seasoning and other foods: Basil. Cilantro. Coriander. Cumin. Mint. Parsley. Sage. Rosemary. Tarragon. Garlic. Oregano. Thyme. Pepper. Balsalmic vinegar. Tahini. Hummus. Tomato sauce. Olives. Mushrooms. ? Limit these  Grains: Prepackaged pasta or rice dishes. Prepackaged cereal with added sugar.  Vegetables: Deep fried potatoes (french fries).  Fruits: Fruit canned in syrup.  Meats and other protein foods: Beef. Pork. Lamb. Poultry with skin. Hot dogs. Berniece Salines.  Dairy: Ice cream. Sour cream. Whole milk.  Beverages: Juice. Sugar-sweetened soft drinks. Beer. Liquor and spirits.  Fats and oils: Butter. Canola oil. Vegetable oil. Beef fat (tallow). Lard.  Sweets and desserts: Cookies. Cakes. Pies. Candy.  Seasoning and other foods: Mayonnaise. Premade sauces and marinades. The items listed may not be a complete list. Talk with your  dietitian about what dietary choices are right for you. Summary  The Mediterranean diet includes both food and lifestyle choices.  Eat a variety of fresh fruits and vegetables, beans, nuts, seeds, and whole grains.  Limit the amount of red meat and sweets that you eat.  Talk with your health care provider about whether it is safe for you to drink red wine in moderation. This means 1 glass a day for nonpregnant women and 2 glasses a day for men. A glass of wine equals 5 oz (150 mL). This information is not intended to replace advice given to you by your health care provider. Make sure you discuss any questions you have with your health care provider. Document Revised: 12/01/2015 Document Reviewed: 11/24/2015 Elsevier Patient Education  [place].

## 2020-08-31 NOTE — Progress Notes (Signed)
Patient reports elevated BP. Patient denies pain at this time. Patient takes medication between 4:00 am and 6:00 am. Patient has eaten today.

## 2020-09-01 DIAGNOSIS — Z789 Other specified health status: Secondary | ICD-10-CM | POA: Insufficient documentation

## 2020-09-01 DIAGNOSIS — E7841 Elevated Lipoprotein(a): Secondary | ICD-10-CM | POA: Insufficient documentation

## 2020-09-01 DIAGNOSIS — R972 Elevated prostate specific antigen [PSA]: Secondary | ICD-10-CM | POA: Insufficient documentation

## 2020-09-30 ENCOUNTER — Other Ambulatory Visit: Payer: Self-pay

## 2020-10-21 ENCOUNTER — Encounter: Payer: Self-pay | Admitting: Nurse Practitioner

## 2020-10-21 ENCOUNTER — Ambulatory Visit: Payer: Self-pay | Attending: Nurse Practitioner | Admitting: Nurse Practitioner

## 2020-10-21 ENCOUNTER — Other Ambulatory Visit: Payer: Self-pay

## 2020-10-21 DIAGNOSIS — G4700 Insomnia, unspecified: Secondary | ICD-10-CM

## 2020-10-21 DIAGNOSIS — I10 Essential (primary) hypertension: Secondary | ICD-10-CM

## 2020-10-21 DIAGNOSIS — Z1211 Encounter for screening for malignant neoplasm of colon: Secondary | ICD-10-CM

## 2020-10-21 MED ORDER — AMLODIPINE BESYLATE 5 MG PO TABS
5.0000 mg | ORAL_TABLET | Freq: Every day | ORAL | 1 refills | Status: DC
  Filled 2020-10-21: qty 30, 30d supply, fill #0

## 2020-10-21 MED ORDER — TRAZODONE HCL 100 MG PO TABS
100.0000 mg | ORAL_TABLET | Freq: Every evening | ORAL | 0 refills | Status: DC | PRN
  Filled 2020-10-21: qty 30, 30d supply, fill #0
  Filled 2020-12-20: qty 30, 30d supply, fill #1
  Filled 2021-01-18: qty 30, 30d supply, fill #2

## 2020-10-21 NOTE — Progress Notes (Signed)
Having anxiety Not seen urologist yet.

## 2020-10-21 NOTE — Progress Notes (Signed)
Virtual Visit via Telephone Note Due to national recommendations of social distancing due to [place] 19, telehealth visit is felt to be most appropriate for this patient at this time.  I discussed the limitations, risks, security and privacy concerns of performing an evaluation and management service by telephone and the availability of in person appointments. I also discussed with the patient that there may be a patient responsible charge related to this service. The patient expressed understanding and agreed to proceed.    I connected with Tyler Pratt on 10/21/20  at   4:10 PM EDT  EDT by telephone and verified that I am speaking with the correct person using two identifiers.  Location of Patient: Private Residence   Location of Provider: [place] participating in Telemedicine visit: Tyler Rankins FNP-BC Tyler Pratt    History of Present Illness: Telemedicine visit for: Follow up He has a past medical history of High cholesterol.   Essential Hypertension  States readings at [facility] average: SBP 150-160. Taking HCTZ 25 mg daily. Will add amlodipine 5 mg daily. Denies chest pain, shortness of breath, palpitations, lightheadedness, dizziness, headaches or BLE edema.   BP Readings from Last 3 Encounters:  08/31/20 (!) 155/81  08/23/20 (!) 149/85  07/27/16 126/79     Feels that he worries a lot. Has lots of personal stress. Having to take 1.5 of trazodone for relief of insomnia.   Past Medical History:  Diagnosis Date   High cholesterol     No past surgical history on file.  Family History  Problem Relation Age of Onset   Hypertension Mother    Cancer Mother    Hypertension Father    Cancer Father    Diabetes Maternal Grandmother    Heart disease Paternal Grandfather     Social History   Socioeconomic History   Marital status: Single    Spouse name: Not on file   Number of children: Not on file   Years of education: Not on  file   Highest education level: Not on file  Occupational History   Not on file  Tobacco Use   Smoking status: Every Day    Packs/day: 1.00    Years: 40.00    Pack years: 40.00    Types: Cigarettes   Smokeless tobacco: Never  Substance and Sexual Activity   Alcohol use: Yes    Comment: weekly    Drug use: Yes    Types: Cocaine    Comment: crack   Sexual activity: Not on file  Other Topics Concern   Not on file  Social History Narrative   Not on file   Social Determinants of Health   Financial Resource Strain: Not on file  Food Insecurity: Not on file  Transportation Needs: Not on file  Physical Activity: Not on file  Stress: Not on file  Social Connections: Not on file     Observations/Objective: Awake, alert and oriented x 3   Review of Systems  Constitutional:  Negative for fever, malaise/fatigue and weight loss.  HENT: Negative.  Negative for nosebleeds.   Eyes: Negative.  Negative for blurred vision, double vision and photophobia.  Respiratory: Negative.  Negative for cough and shortness of breath.   Cardiovascular: Negative.  Negative for chest pain, palpitations and leg swelling.  Gastrointestinal: Negative.  Negative for heartburn, nausea and vomiting.  Musculoskeletal: Negative.  Negative for myalgias.  Neurological: Negative.  Negative for dizziness, focal weakness, seizures and headaches.  Psychiatric/Behavioral:  Negative for suicidal ideas. The patient has insomnia.    Assessment and Plan: [name] was seen today for establish care.  Diagnoses and all orders for this visit:  Primary hypertension -     amLODipine (NORVASC) 5 MG tablet; Take 1 tablet (5 mg total) by mouth daily. Continue all antihypertensives as prescribed.  Remember to bring in your blood pressure log with you for your follow up appointment.  DASH/Mediterranean Diets are healthier choices for HTN.    Insomnia, unspecified type -     traZODone (DESYREL) 100 MG tablet; Take 1 tablet  (100 mg total) by mouth at bedtime as needed for sleep.  Colon cancer screening -     Fecal occult blood, imunochemical([facility])    Follow Up Instructions Return in about 3 weeks (around 11/11/2020) for BP CHECK WITH [facility].     I discussed the assessment and treatment plan with the patient. The patient was provided an opportunity to ask questions and all were answered. The patient agreed with the plan and demonstrated an understanding of the instructions.   The patient was advised to call back or seek an in-person evaluation if the symptoms worsen or if the condition fails to improve as anticipated.  I provided 11 minutes of non-face-to-face time during this encounter including median intraservice time, reviewing previous notes, labs, imaging, medications and explaining diagnosis and management.  Gildardo Pounds, FNP-BC

## 2020-10-24 ENCOUNTER — Other Ambulatory Visit: Payer: Self-pay

## 2020-11-15 ENCOUNTER — Ambulatory Visit: Payer: Self-pay | Admitting: Pharmacist

## 2020-12-12 ENCOUNTER — Other Ambulatory Visit: Payer: Self-pay | Admitting: Pharmacist

## 2020-12-12 ENCOUNTER — Other Ambulatory Visit: Payer: Self-pay

## 2020-12-12 DIAGNOSIS — I10 Essential (primary) hypertension: Secondary | ICD-10-CM

## 2020-12-12 MED ORDER — AMLODIPINE BESYLATE 5 MG PO TABS
5.0000 mg | ORAL_TABLET | Freq: Every day | ORAL | 1 refills | Status: DC
  Filled 2020-12-12: qty 30, 30d supply, fill #0
  Filled 2021-02-04: qty 30, 30d supply, fill #1

## 2020-12-12 MED ORDER — HYDROCHLOROTHIAZIDE 25 MG PO TABS
25.0000 mg | ORAL_TABLET | Freq: Every day | ORAL | 1 refills | Status: DC
  Filled 2020-12-12: qty 30, 30d supply, fill #0
  Filled 2021-01-18: qty 30, 30d supply, fill #1

## 2020-12-13 ENCOUNTER — Other Ambulatory Visit: Payer: Self-pay

## 2020-12-13 ENCOUNTER — Ambulatory Visit: Payer: Self-pay | Admitting: Pharmacist

## 2020-12-20 ENCOUNTER — Other Ambulatory Visit: Payer: Self-pay

## 2020-12-21 ENCOUNTER — Other Ambulatory Visit: Payer: Self-pay

## 2021-01-03 ENCOUNTER — Ambulatory Visit: Payer: Self-pay | Admitting: Pharmacist

## 2021-01-10 ENCOUNTER — Ambulatory Visit: Payer: Self-pay | Admitting: Pharmacist

## 2021-01-11 ENCOUNTER — Telehealth: Payer: Self-pay | Admitting: Nurse Practitioner

## 2021-01-11 NOTE — Telephone Encounter (Signed)
Copied from [place] [id number]. Topic: Appointment Scheduling - Scheduling Inquiry for Clinic >> Jan 09, 2021  1:29 PM Alanda Slim E wrote: Reason for CRM: Pt would like to cancel/ reschedule his appt with Lurena Joiner for 2 weeks out / please advise   Called patient no answer and vm not set up. If patient returns call will need to be rescheduled for an appointment with [facility].

## 2021-01-18 ENCOUNTER — Other Ambulatory Visit: Payer: Self-pay

## 2021-01-23 ENCOUNTER — Other Ambulatory Visit: Payer: Self-pay

## 2021-01-27 ENCOUNTER — Other Ambulatory Visit: Payer: Self-pay

## 2021-02-06 ENCOUNTER — Other Ambulatory Visit: Payer: Self-pay

## 2021-02-07 ENCOUNTER — Other Ambulatory Visit: Payer: Self-pay

## 2021-02-20 ENCOUNTER — Other Ambulatory Visit: Payer: Self-pay | Admitting: Nurse Practitioner

## 2021-02-20 DIAGNOSIS — G4700 Insomnia, unspecified: Secondary | ICD-10-CM

## 2021-02-20 NOTE — Telephone Encounter (Signed)
Requested medication (s) are due for refill today Yes  Requested medication (s) are on the active medication list Yes  Future visit scheduled No.  Last ordered on 10/21/20 #90 tabs no refills with end date 02/19/21.  Note to clinic-Current prescription has an end date of 02/19/21. Routing to physician for review.    Requested Prescriptions  Pending Prescriptions Disp Refills   traZODone (DESYREL) 100 MG tablet 90 tablet 0    Sig: Take 1 tablet (100 mg total) by mouth at bedtime as needed for sleep.     Psychiatry: Antidepressants - Serotonin Modulator Passed - 02/20/2021  7:49 AM      Passed - Valid encounter within last 6 months    Recent Outpatient Visits           4 months ago Primary hypertension   [place], Vernia Buff, NP

## 2021-02-21 ENCOUNTER — Other Ambulatory Visit: Payer: Self-pay

## 2021-02-22 ENCOUNTER — Other Ambulatory Visit: Payer: Self-pay | Admitting: Family Medicine

## 2021-02-22 ENCOUNTER — Other Ambulatory Visit: Payer: Self-pay

## 2021-02-22 ENCOUNTER — Ambulatory Visit: Payer: Self-pay | Attending: Physician Assistant | Admitting: Physician Assistant

## 2021-02-22 VITALS — BP 150/84 | HR 75 | Temp 98.7°F | Resp 18 | Ht 70.0 in | Wt 181.0 lb

## 2021-02-22 DIAGNOSIS — Z6825 Body mass index (BMI) 25.0-25.9, adult: Secondary | ICD-10-CM

## 2021-02-22 DIAGNOSIS — I10 Essential (primary) hypertension: Secondary | ICD-10-CM

## 2021-02-22 DIAGNOSIS — R972 Elevated prostate specific antigen [PSA]: Secondary | ICD-10-CM

## 2021-02-22 DIAGNOSIS — E7841 Elevated Lipoprotein(a): Secondary | ICD-10-CM

## 2021-02-22 DIAGNOSIS — G4700 Insomnia, unspecified: Secondary | ICD-10-CM

## 2021-02-22 MED ORDER — EZETIMIBE 10 MG PO TABS
10.0000 mg | ORAL_TABLET | Freq: Every day | ORAL | 3 refills | Status: DC
Start: 2021-02-22 — End: 2022-02-26
  Filled 2021-02-22: qty 30, 30d supply, fill #0
  Filled 2021-04-04 – 2021-04-12 (×2): qty 30, 30d supply, fill #1
  Filled 2021-05-23: qty 30, 30d supply, fill #0
  Filled 2021-05-23: qty 30, 30d supply, fill #2
  Filled 2021-06-30: qty 30, 30d supply, fill #1
  Filled 2021-08-03: qty 30, 30d supply, fill #2
  Filled 2021-09-07 – 2021-09-14 (×2): qty 30, 30d supply, fill #3
  Filled 2021-10-20: qty 30, 30d supply, fill #4
  Filled 2021-11-22: qty 30, 30d supply, fill #5
  Filled 2021-12-21: qty 30, 30d supply, fill #6
  Filled 2022-01-25 (×2): qty 30, 30d supply, fill #7

## 2021-02-22 MED ORDER — AMLODIPINE BESYLATE 5 MG PO TABS
5.0000 mg | ORAL_TABLET | Freq: Every day | ORAL | 1 refills | Status: DC
  Filled 2021-02-22: qty 90, 90d supply, fill #0
  Filled 2021-03-14: qty 30, 30d supply, fill #0
  Filled 2021-04-20: qty 30, 30d supply, fill #1
  Filled 2021-04-20 – 2021-05-11 (×2): qty 30, 30d supply, fill #0
  Filled 2021-06-16: qty 30, 30d supply, fill #1
  Filled 2021-07-25: qty 30, 30d supply, fill #2

## 2021-02-22 MED ORDER — HYDROCHLOROTHIAZIDE 50 MG PO TABS
50.0000 mg | ORAL_TABLET | Freq: Every day | ORAL | 1 refills | Status: DC
Start: 2021-02-22 — End: 2021-10-06
  Filled 2021-02-22: qty 30, 30d supply, fill #0
  Filled 2021-03-28: qty 30, 30d supply, fill #1
  Filled 2021-05-10: qty 30, 30d supply, fill #2
  Filled 2021-05-10: qty 30, 30d supply, fill #0
  Filled 2021-06-16: qty 30, 30d supply, fill #1
  Filled 2021-07-25: qty 30, 30d supply, fill #2
  Filled 2021-08-24: qty 30, 30d supply, fill #3

## 2021-02-22 MED ORDER — TRAZODONE HCL 100 MG PO TABS
ORAL_TABLET | ORAL | 1 refills | Status: DC
  Filled 2021-02-22: qty 45, 30d supply, fill #0
  Filled 2021-03-28: qty 45, 30d supply, fill #1
  Filled 2021-05-10: qty 45, 30d supply, fill #0
  Filled 2021-05-10: qty 45, 30d supply, fill #2
  Filled 2021-06-27: qty 45, 30d supply, fill #1
  Filled 2021-08-03: qty 45, 30d supply, fill #2
  Filled 2021-09-22: qty 45, 30d supply, fill #3
  Filled 2021-10-23: qty 20, 13d supply, fill #4

## 2021-02-22 NOTE — Patient Instructions (Addendum)
I strongly encourage you to follow-up with urology  [facility] ph. # [phone]. Address [place] [address]  I strongly encourage you to complete CAFA application   You will start taking an increased dose of HCTZ 50 mg once a day.  I encourage you to check your blood pressure at home on a daily basis, keep a written log and have available for all office visits.  We will call you with today's lab results.  Kennieth Rad, PA-C Physician Assistant [facility] [email or web address]   DASH Eating Plan DASH stands for Dietary Approaches to Stop Hypertension. The DASH eating plan is a healthy eating plan that has been shown to: Reduce high blood pressure (hypertension). Reduce your risk for type 2 diabetes, heart disease, and stroke. Help with weight loss. What are tips for following this plan? Reading food labels Check food labels for the amount of salt (sodium) per serving. Choose foods with less than 5 percent of the Daily Value of sodium. Generally, foods with less than 300 milligrams (mg) of sodium per serving fit into this eating plan. To find whole grains, look for the word "whole" as the first word in the ingredient list. Shopping Buy products labeled as "low-sodium" or "no salt added." Buy fresh foods. Avoid canned foods and pre-made or frozen meals. Cooking Avoid adding salt when cooking. Use salt-free seasonings or herbs instead of table salt or sea salt. Check with your health care provider or pharmacist before using salt substitutes. Do not fry foods. Cook foods using healthy methods such as baking, boiling, grilling, roasting, and broiling instead. Cook with heart-healthy oils, such as olive, canola, avocado, soybean, or sunflower oil. Meal planning  Eat a balanced diet that includes: 4 or more servings of fruits and 4 or more servings of vegetables each day. Try to fill one-half of your plate with  fruits and vegetables. 6-8 servings of whole grains each day. Less than 6 oz (170 g) of lean meat, poultry, or fish each day. A 3-oz (85-g) serving of meat is about the same size as a deck of cards. One egg equals 1 oz (28 g). 2-3 servings of low-fat dairy each day. One serving is 1 cup (237 mL). 1 serving of nuts, seeds, or beans 5 times each week. 2-3 servings of heart-healthy fats. Healthy fats called omega-3 fatty acids are found in foods such as walnuts, flaxseeds, fortified milks, and eggs. These fats are also found in cold-water fish, such as sardines, salmon, and mackerel. Limit how much you eat of: Canned or prepackaged foods. Food that is high in trans fat, such as some fried foods. Food that is high in saturated fat, such as fatty meat. Desserts and other sweets, sugary drinks, and other foods with added sugar. Full-fat dairy products. Do not salt foods before eating. Do not eat more than 4 egg yolks a week. Try to eat at least 2 vegetarian meals a week. Eat more home-cooked food and less restaurant, buffet, and fast food. Lifestyle When eating at a restaurant, ask that your food be prepared with less salt or no salt, if possible. If you drink alcohol: Limit how much you use to: 0-1 drink a day for women who are not pregnant. 0-2 drinks a day for men. Be aware of how much alcohol is in your drink. In the [place] one drink equals one 12 oz bottle of beer (355 mL), one 5 oz glass of wine (148 mL), or one 1 oz  glass of hard liquor (44 mL). General information Avoid eating more than 2,300 mg of salt a day. If you have hypertension, you may need to reduce your sodium intake to 1,500 mg a day. Work with your health care provider to maintain a healthy body weight or to lose weight. Ask what an ideal weight is for you. Get at least 30 minutes of exercise that causes your heart to beat faster (aerobic exercise) most days of the week. Activities may include walking, swimming, or  biking. Work with your health care provider or dietitian to adjust your eating plan to your individual calorie needs. What foods should I eat? Fruits All fresh, dried, or frozen fruit. Canned fruit in natural juice (without added sugar). Vegetables Fresh or frozen vegetables (raw, steamed, roasted, or grilled). Low-sodium or reduced-sodium tomato and vegetable juice. Low-sodium or reduced-sodium tomato sauce and tomato paste. Low-sodium or reduced-sodium canned vegetables. Grains Whole-grain or whole-wheat bread. Whole-grain or whole-wheat pasta. Brown rice. [place]. Bulgur. Whole-grain and low-sodium cereals. Pita bread. Low-fat, low-sodium crackers. Whole-wheat flour tortillas. Meats and other proteins Skinless chicken or [place]. Ground chicken or [place]. Pork with fat trimmed off. Fish and seafood. Egg whites. Dried beans, peas, or lentils. Unsalted nuts, nut butters, and seeds. Unsalted canned beans. Lean cuts of beef with fat trimmed off. Low-sodium, lean precooked or cured meat, such as sausages or meat loaves. Dairy Low-fat (1%) or fat-free (skim) milk. Reduced-fat, low-fat, or fat-free cheeses. Nonfat, low-sodium ricotta or cottage cheese. Low-fat or nonfat yogurt. Low-fat, low-sodium cheese. Fats and oils Soft margarine without trans fats. Vegetable oil. Reduced-fat, low-fat, or light mayonnaise and salad dressings (reduced-sodium). Canola, safflower, olive, avocado, soybean, and sunflower oils. Avocado. Seasonings and condiments Herbs. Spices. Seasoning mixes without salt. Other foods Unsalted popcorn and pretzels. Fat-free sweets. The items listed above may not be a complete list of foods and beverages you can eat. Contact a dietitian for more information. What foods should I avoid? Fruits Canned fruit in a light or heavy syrup. Fried fruit. Fruit in cream or butter sauce. Vegetables Creamed or fried vegetables. Vegetables in a cheese sauce. Regular canned vegetables (not  low-sodium or reduced-sodium). Regular canned tomato sauce and paste (not low-sodium or reduced-sodium). Regular tomato and vegetable juice (not low-sodium or reduced-sodium). Angie Fava. Olives. Grains Baked goods made with fat, such as croissants, muffins, or some breads. Dry pasta or rice meal packs. Meats and other proteins Fatty cuts of meat. Ribs. Fried meat. Berniece Salines. Bologna, salami, and other precooked or cured meats, such as sausages or meat loaves. Fat from the back of a pig (fatback). Bratwurst. Salted nuts and seeds. Canned beans with added salt. Canned or smoked fish. Whole eggs or egg yolks. Chicken or [place] with skin. Dairy Whole or 2% milk, cream, and half-and-half. Whole or full-fat cream cheese. Whole-fat or sweetened yogurt. Full-fat cheese. Nondairy creamers. Whipped toppings. Processed cheese and cheese spreads. Fats and oils Butter. Stick margarine. Lard. Shortening. Ghee. Bacon fat. Tropical oils, such as coconut, palm kernel, or palm oil. Seasonings and condiments Onion salt, garlic salt, seasoned salt, table salt, and sea salt. Worcestershire sauce. Tartar sauce. Barbecue sauce. Teriyaki sauce. Soy sauce, including reduced-sodium. Steak sauce. Canned and packaged gravies. Fish sauce. Oyster sauce. Cocktail sauce. Store-bought horseradish. Ketchup. Mustard. Meat flavorings and tenderizers. Bouillon cubes. Hot sauces. Pre-made or packaged marinades. Pre-made or packaged taco seasonings. Relishes. Regular salad dressings. Other foods Salted popcorn and pretzels. The items listed above may not be a complete list of foods and  beverages you should avoid. Contact a dietitian for more information. Where to find more information National Heart, Lung, and Blood Institute: https://wilson-eaton.com/ American Heart Association: www.heart.org Academy of Nutrition and Dietetics: www.eatright.[place]: www.kidney.org Summary The DASH eating plan is a healthy eating plan that  has been shown to reduce high blood pressure (hypertension). It may also reduce your risk for type 2 diabetes, heart disease, and stroke. When on the DASH eating plan, aim to eat more fresh fruits and vegetables, whole grains, lean proteins, low-fat dairy, and heart-healthy fats. With the DASH eating plan, you should limit salt (sodium) intake to 2,300 mg a day. If you have hypertension, you may need to reduce your sodium intake to 1,500 mg a day. Work with your health care provider or dietitian to adjust your eating plan to your individual calorie needs. This information is not intended to replace advice given to you by your health care provider. Make sure you discuss any questions you have with your health care provider. Document Revised: 03/06/2019 Document Reviewed: 03/06/2019 Elsevier Patient Education  2022 [employer].

## 2021-02-22 NOTE — Progress Notes (Signed)
Established Patient Office Visit  Subjective:  Patient ID: Tyler Pratt, male    DOB: March 03, 1956  Age: 65 y.o. MRN: [id number]  CC:  Chief Complaint  Patient presents with   Hypertension    HPI Tyler Pratt presents for medication refills.  Reports that he has been checking his blood pressure at [facility] on occasion.  Reports that when he does check it he does have elevated readings similar to today's.  Denies any hypertensive symptoms.  Reports that he has not had follow-up with urology due to financial constraints.  Does endorse that he has had increased urinary urgency.  Reports that he has been using 100 to 150 mg of trazodone with relief of insomnia.  Reports that he will use 150 mg if he has been having higher anxiety levels during that day.      Past Medical History:  Diagnosis Date   High cholesterol     History reviewed. No pertinent surgical history.  Family History  Problem Relation Age of Onset   Hypertension Mother    Cancer Mother    Hypertension Father    Cancer Father    Diabetes Maternal Grandmother    Heart disease Paternal Grandfather     Social History   Socioeconomic History   Marital status: Single    Spouse name: Not on file   Number of children: Not on file   Years of education: Not on file   Highest education level: Not on file  Occupational History   Not on file  Tobacco Use   Smoking status: Every Day    Packs/day: 1.00    Years: 40.00    Pack years: 40.00    Types: Cigarettes   Smokeless tobacco: Never  Substance and Sexual Activity   Alcohol use: Yes    Comment: weekly    Drug use: Yes    Types: Cocaine    Comment: crack   Sexual activity: Not on file  Other Topics Concern   Not on file  Social History Narrative   Not on file   Social Determinants of Health   Financial Resource Strain: Not on file  Food Insecurity: Not on file  Transportation Needs: Not on file  Physical Activity: Not on file  Stress: Not on file   Social Connections: Not on file  Intimate Partner Violence: Not on file    Outpatient Medications Prior to Visit  Medication Sig Dispense Refill   amLODipine (NORVASC) 5 MG tablet Take 1 tablet (5 mg total) by mouth daily. 30 tablet 1   ezetimibe (ZETIA) 10 MG tablet Take 1 tablet (10 mg total) by mouth daily. 90 tablet 3   hydrochlorothiazide (HYDRODIURIL) 25 MG tablet Take 1 tablet (25 mg total) by mouth daily. 30 tablet 1   traZODone (DESYREL) 100 MG tablet Take 1 tablet (100 mg total) by mouth at bedtime as needed for sleep. 90 tablet 0   No facility-administered medications prior to visit.    No Known Allergies  ROS Review of Systems  Constitutional: Negative.   HENT: Negative.    Eyes: Negative.   Respiratory:  Negative for shortness of breath.   Cardiovascular:  Negative for chest pain.  Gastrointestinal: Negative.   Endocrine: Negative.   Genitourinary:  Positive for urgency. Negative for difficulty urinating and hematuria.  Musculoskeletal: Negative.   Skin: Negative.   Allergic/Immunologic: Negative.   Neurological: Negative.   Hematological: Negative.   Psychiatric/Behavioral:  Positive for sleep disturbance. Negative for dysphoric mood, self-injury  and suicidal ideas. The patient is nervous/anxious.      Objective:    Physical Exam Vitals and nursing note reviewed.  Constitutional:      Appearance: Normal appearance.  HENT:     Head: Normocephalic and atraumatic.     Right Ear: External ear normal.     Left Ear: External ear normal.     Nose: Nose normal.     Mouth/Throat:     Mouth: Mucous membranes are moist.     Pharynx: Oropharynx is clear.  Eyes:     Extraocular Movements: Extraocular movements intact.     Conjunctiva/sclera: Conjunctivae normal.     Pupils: Pupils are equal, round, and reactive to light.  Cardiovascular:     Rate and Rhythm: Normal rate and regular rhythm.     Pulses: Normal pulses.     Heart sounds: Normal heart sounds.   Pulmonary:     Effort: Pulmonary effort is normal.     Breath sounds: Normal breath sounds.  Musculoskeletal:        General: Normal range of motion.     Cervical back: Normal range of motion and neck supple.  Skin:    General: Skin is warm and dry.  Neurological:     General: No focal deficit present.     Mental Status: He is alert and oriented to person, place, and time.  Psychiatric:        Mood and Affect: Mood normal.        Behavior: Behavior normal.        Thought Content: Thought content normal.        Judgment: Judgment normal.    BP (!) 150/84 (BP Location: Left Arm, Patient Position: Sitting, Cuff Size: Normal)   Pulse 75   Temp 98.7 F (37.1 C) (Oral)   Resp 18   Ht _0  (1.778 m)   Wt 181 lb (82.1 kg)   SpO2 99%   BMI 25.97 kg/m  Wt Readings from Last 3 Encounters:  02/22/21 181 lb (82.1 kg)  08/31/20 181 lb (82.1 kg)  08/23/20 181 lb (82.1 kg)     Health Maintenance Due  Topic Date Due   COVID-19 Vaccine (1) Never done   Pneumococcal Vaccine 19-48 Years old (1 - PCV) Never done   TETANUS/TDAP  Never done   COLONOSCOPY (Pts 45-48yr Insurance coverage will need to be confirmed)  Never done   Zoster Vaccines- Shingrix (1 of 2) Never done   INFLUENZA VACCINE  Never done    There are no preventive care reminders to display for this patient.  Lab Results  Component Value Date   TSH 0.938 08/24/2020   Lab Results  Component Value Date   WBC 9.7 08/24/2020   HGB 14.8 08/24/2020   HCT 45.2 08/24/2020   MCV 92 08/24/2020   PLT 280 08/24/2020   Lab Results  Component Value Date   NA 139 02/22/2021   K 4.3 02/22/2021   CO2 23 02/16/2007   GLUCOSE 109 (H) 02/22/2021   BUN 11 02/22/2021   CREATININE 1.03 02/22/2021   BILITOT 0.4 02/22/2021   ALKPHOS 89 02/22/2021   AST 13 02/22/2021   ALT 16 04/02/2013   PROT 6.6 02/22/2021   ALBUMIN 4.4 02/22/2021   CALCIUM 9.3 02/22/2021   EGFR 81 02/22/2021   Lab Results  Component Value Date    CHOL 237 (H) 02/22/2021   Lab Results  Component Value Date   HDL 56 02/22/2021   Lab  Results  Component Value Date   LDLCALC 155 (H) 02/22/2021   Lab Results  Component Value Date   TRIG 146 02/22/2021   Lab Results  Component Value Date   CHOLHDL 4.2 02/22/2021   Lab Results  Component Value Date   HGBA1C 5.2 08/23/2020      Assessment & Plan:   Problem List Items Addressed This Visit       Cardiovascular and Mediastinum   Essential hypertension - Primary   Relevant Medications   hydrochlorothiazide (HYDRODIURIL) 50 MG tablet   amLODipine (NORVASC) 5 MG tablet   ezetimibe (ZETIA) 10 MG tablet   Other Relevant Orders   Comp. Metabolic Panel (12) (Completed)     Other   Insomnia   Relevant Medications   traZODone (DESYREL) 100 MG tablet   Elevated PSA, greater than or equal to 20 ng/ml   Relevant Orders   PSA (Completed)   Elevated lipoprotein(a)   Relevant Medications   ezetimibe (ZETIA) 10 MG tablet   Other Relevant Orders   Lipid panel (Completed)    Meds ordered this encounter  Medications   hydrochlorothiazide (HYDRODIURIL) 50 MG tablet    Sig: Take 1 tablet (50 mg total) by mouth daily.    Dispense:  90 tablet    Refill:  1    Dose change    Order Specific Question:   Supervising Provider    Answer:   Asencion Noble E [[id number]]   amLODipine (NORVASC) 5 MG tablet    Sig: Take 1 tablet (5 mg total) by mouth daily.    Dispense:  90 tablet    Refill:  1    Order Specific Question:   Supervising Provider    Answer:   Elsie Stain [[id number]]   ezetimibe (ZETIA) 10 MG tablet    Sig: Take 1 tablet (10 mg total) by mouth daily.    Dispense:  90 tablet    Refill:  3    Order Specific Question:   Supervising Provider    Answer:   Asencion Noble E [[id number]]   traZODone (DESYREL) 100 MG tablet    Sig: Take 1 -1.5 tablets by mouth daily at bedtime as needed.    Dispense:  145 tablet    Refill:  1    Increased dosing    Order Specific Question:    Supervising Provider    Answer:   Asencion Noble E [[id number]]   1. Essential hypertension Increase hydrochlorothiazide 50 mg.  Patient encouraged to check blood pressure at home on a daily basis, keep a written log and have available for al office visits.  Red flags given for prompt reevaluation.  - hydrochlorothiazide (HYDRODIURIL) 50 MG tablet; Take 1 tablet (50 mg total) by mouth daily.  Dispense: 90 tablet; Refill: 1 - amLODipine (NORVASC) 5 MG tablet; Take 1 tablet (5 mg total) by mouth daily.  Dispense: 90 tablet; Refill: 1 - Comp. Metabolic Panel (12)  2. Elevated lipoprotein(a) Continue current regimen - ezetimibe (ZETIA) 10 MG tablet; Take 1 tablet (10 mg total) by mouth daily.  Dispense: 90 tablet; Refill: 3 - Lipid panel  3. Insomnia, unspecified type Continue current regimen - traZODone (DESYREL) 100 MG tablet; Take 1 -1.5 tablets by mouth daily at bedtime as needed.  Dispense: 145 tablet; Refill: 1  4. Elevated PSA, greater than or equal to 20 ng/ml Patient strongly encouraged to follow-up with urology, patient strongly encouraged to complete application for [place] financial assistance.  Patient understands and agrees. -  PSA   I have reviewed the patient's medical history (PMH, PSH, Social History, Family History, Medications, and allergies) , and have been updated if relevant. I spent 30 minutes reviewing chart and  face to face time with patient.   Follow-up: Return in about 3 months (around 05/25/2021).    Loraine Grip Mayers, PA-C

## 2021-02-22 NOTE — Progress Notes (Signed)
Patient took medication around 7 am and has had coffee with sugar. Patient denies pain at this time.

## 2021-02-23 ENCOUNTER — Other Ambulatory Visit: Payer: Self-pay

## 2021-02-23 ENCOUNTER — Encounter: Payer: Self-pay | Admitting: Physician Assistant

## 2021-02-23 ENCOUNTER — Telehealth: Payer: Self-pay | Admitting: *Deleted

## 2021-02-23 LAB — LIPID PANEL
Chol/HDL Ratio: 4.2 ratio (ref 0.0–5.0)
Cholesterol, Total: 237 mg/dL — ABNORMAL HIGH (ref 100–199)
HDL: 56 mg/dL (ref >39)
LDL Chol Calc (NIH): 155 mg/dL — ABNORMAL HIGH (ref 0–99)
Triglycerides: 146 mg/dL (ref 0–149)
VLDL Cholesterol Cal: 26 mg/dL (ref 5–40)

## 2021-02-23 LAB — COMP. METABOLIC PANEL (12)
AST: 13 IU/L (ref 0–40)
Albumin/Globulin Ratio: 2 (ref 1.2–2.2)
Albumin: 4.4 g/dL (ref 3.8–4.8)
Alkaline Phosphatase: 89 IU/L (ref 44–121)
BUN/Creatinine Ratio: 11 (ref 10–24)
BUN: 11 mg/dL (ref 8–27)
Bilirubin Total: 0.4 mg/dL (ref 0.0–1.2)
Calcium: 9.3 mg/dL (ref 8.6–10.2)
Chloride: 101 mmol/L (ref 96–106)
Creatinine, Ser: 1.03 mg/dL (ref 0.76–1.27)
Globulin, Total: 2.2 g/dL (ref 1.5–4.5)
Glucose: 109 mg/dL — ABNORMAL HIGH (ref 70–99)
Potassium: 4.3 mmol/L (ref 3.5–5.2)
Sodium: 139 mmol/L (ref 134–144)
Total Protein: 6.6 g/dL (ref 6.0–8.5)
eGFR: 81 mL/min/{1.73_m2} (ref >59)

## 2021-02-23 LAB — PSA: Prostate Specific Ag, Serum: 1203 ng/mL — ABNORMAL HIGH (ref 0.0–4.0)

## 2021-02-23 NOTE — Telephone Encounter (Signed)
-----   Message from Kennieth Rad, [place] sent at 02/23/2021  8:21 AM EST ----- Please call patient and let him know that his kidney and liver function are within normal limits.  His prostate levels have increased, it is very important that he follow-up with urology.  His cholesterol levels have improved however they are still elevated including his LDL.  It is very important that he stay compliant to his cholesterol medication as well as follow a low-cholesterol diet.

## 2021-02-23 NOTE — Telephone Encounter (Signed)
Patient verified DOB Patient is aware of liver and kidney being normal and needing to follow up with urology for prostate levels as well as taking cholesterol medication and following low cholesterol diet.

## 2021-03-14 ENCOUNTER — Other Ambulatory Visit: Payer: Self-pay

## 2021-03-17 ENCOUNTER — Other Ambulatory Visit: Payer: Self-pay

## 2021-03-28 ENCOUNTER — Other Ambulatory Visit: Payer: Self-pay

## 2021-03-30 ENCOUNTER — Other Ambulatory Visit: Payer: Self-pay

## 2021-04-05 ENCOUNTER — Other Ambulatory Visit: Payer: Self-pay

## 2021-04-12 ENCOUNTER — Other Ambulatory Visit: Payer: Self-pay

## 2021-04-21 ENCOUNTER — Other Ambulatory Visit: Payer: Self-pay

## 2021-04-22 ENCOUNTER — Other Ambulatory Visit: Payer: Self-pay

## 2021-04-28 ENCOUNTER — Other Ambulatory Visit: Payer: Self-pay

## 2021-05-10 ENCOUNTER — Other Ambulatory Visit: Payer: Self-pay

## 2021-05-11 ENCOUNTER — Other Ambulatory Visit: Payer: Self-pay

## 2021-05-23 ENCOUNTER — Other Ambulatory Visit: Payer: Self-pay

## 2021-05-24 ENCOUNTER — Other Ambulatory Visit: Payer: Self-pay

## 2021-05-26 ENCOUNTER — Ambulatory Visit: Payer: Self-pay | Admitting: Nurse Practitioner

## 2021-06-12 ENCOUNTER — Other Ambulatory Visit (HOSPITAL_COMMUNITY): Payer: Self-pay

## 2021-06-16 ENCOUNTER — Other Ambulatory Visit: Payer: Self-pay

## 2021-06-19 ENCOUNTER — Other Ambulatory Visit: Payer: Self-pay

## 2021-06-27 ENCOUNTER — Other Ambulatory Visit: Payer: Self-pay

## 2021-06-30 ENCOUNTER — Other Ambulatory Visit: Payer: Self-pay

## 2021-07-25 ENCOUNTER — Other Ambulatory Visit: Payer: Self-pay

## 2021-07-26 ENCOUNTER — Encounter: Payer: Self-pay | Admitting: Nurse Practitioner

## 2021-07-26 ENCOUNTER — Ambulatory Visit: Payer: Self-pay | Attending: Nurse Practitioner | Admitting: Nurse Practitioner

## 2021-07-26 ENCOUNTER — Other Ambulatory Visit (HOSPITAL_COMMUNITY)
Admission: RE | Admit: 2021-07-26 | Discharge: 2021-07-26 | Disposition: A | Discharge status: Home / Self Care | Payer: Self-pay | Source: Ambulatory Visit | Attending: Nurse Practitioner | Admitting: Nurse Practitioner

## 2021-07-26 VITALS — BP 139/80 | HR 75 | Wt 177.4 lb

## 2021-07-26 DIAGNOSIS — R972 Elevated prostate specific antigen [PSA]: Secondary | ICD-10-CM

## 2021-07-26 DIAGNOSIS — I10 Essential (primary) hypertension: Secondary | ICD-10-CM

## 2021-07-26 DIAGNOSIS — Z1211 Encounter for screening for malignant neoplasm of colon: Secondary | ICD-10-CM

## 2021-07-26 DIAGNOSIS — D72829 Elevated white blood cell count, unspecified: Secondary | ICD-10-CM

## 2021-07-26 MED ORDER — SULFAMETHOXAZOLE-TRIMETHOPRIM 800-160 MG PO TABS
1.0000 | ORAL_TABLET | Freq: Two times a day (BID) | ORAL | 0 refills | Status: AC
  Filled 2021-07-26: qty 60, 30d supply, fill #0

## 2021-07-26 MED ORDER — AMLODIPINE BESYLATE 10 MG PO TABS
10.0000 mg | ORAL_TABLET | Freq: Every day | ORAL | 1 refills | Status: DC
Start: 2021-07-26 — End: 2022-02-26
  Filled 2021-07-26: qty 90, 90d supply, fill #0
  Filled 2021-11-22: qty 90, 90d supply, fill #1

## 2021-07-26 NOTE — Progress Notes (Signed)
? ?Assessment & Plan:  ?Manolo was seen today for hypertension. ? ?Diagnoses and all orders for this visit: ? ?Essential hypertension ?-     amLODipine (NORVASC) 10 MG tablet; Take 1 tablet (10 mg total) by mouth daily. ?-     CMP14+EGFR ? ?Elevated PSA, greater than or equal to 20 ng/ml ?-     PSA ?-     sulfamethoxazole-trimethoprim (BACTRIM DS) 800-160 MG tablet; Take 1 tablet by mouth 2 (two) times daily. ?-     Urine cytology ancillary only ? ?Leukocytosis, unspecified type ?-     CBC with Differential ? ?Colon cancer screening ?-     Fecal occult blood, imunochemical([facility]) ? ? ? ?Patient has been counseled on age-appropriate routine health concerns for screening and prevention. These are reviewed and up-to-date. Referrals have been placed accordingly. Immunizations are up-to-date or declined.    ?Subjective:  ? ?Chief Complaint  ?Patient presents with  ? Hypertension  ? ?HPI ?Tyler Pratt 66 y.o. male presents to office today for follow up to HTN ? ?His PSA has been elevated for the past year. States he could not afford the $200 copay that urology required up front. PSA has doubled in 6 months. He is aware this is most likely not prostatitis but likely prostate cancer. Will treat as prostatitis and obtain urine cytology at this time as well. He states he was diagnosed with BPH over 10 years ago.  ?Lab Results  ?Component Value Date  ? PSA1 1,203.0 (H) 02/22/2021  ? PSA1 621.0 (H) 08/24/2020  ? ?  ?HTN ?Blood pressure is slightly elevated. Will increase amlodipine from 5 mg to 10 mg today and he will continue on HCTZ 50 mg daily.  ?BP Readings from Last 3 Encounters:  ?07/26/21 139/80  ?02/22/21 (!) 150/84  ?08/31/20 (!) 155/81  ?  ? ?Review of Systems  ?Constitutional:  Negative for fever, malaise/fatigue and weight loss.  ?HENT: Negative.  Negative for nosebleeds.   ?Eyes: Negative.  Negative for blurred vision, double vision and photophobia.  ?Respiratory: Negative.  Negative for cough and  shortness of breath.   ?Cardiovascular: Negative.  Negative for chest pain, palpitations and leg swelling.  ?Gastrointestinal: Negative.  Negative for heartburn, nausea and vomiting.  ?Genitourinary:  Positive for frequency and urgency. Negative for dysuria, flank pain and hematuria.  ?     Hesitancy, urinary incontinence  ?Musculoskeletal: Negative.  Negative for myalgias.  ?Neurological: Negative.  Negative for dizziness, focal weakness, seizures and headaches.  ?Psychiatric/Behavioral: Negative.  Negative for suicidal ideas.   ? ?Past Medical History:  ?Diagnosis Date  ? High cholesterol   ? ? ?History reviewed. No pertinent surgical history. ? ?Family History  ?Problem Relation Age of Onset  ? Hypertension Mother   ? Cancer Mother   ? Hypertension Father   ? Cancer Father   ? Diabetes Maternal Grandmother   ? Heart disease Paternal Grandfather   ? ? ?Social History Reviewed with no changes to be made today.  ? ?Outpatient Medications Prior to Visit  ?Medication Sig Dispense Refill  ? ezetimibe (ZETIA) 10 MG tablet Take 1 tablet (10 mg total) by mouth daily. 90 tablet 3  ? hydrochlorothiazide (HYDRODIURIL) 50 MG tablet Take 1 tablet (50 mg total) by mouth daily. 90 tablet 1  ? traZODone (DESYREL) 100 MG tablet Take 1 -1.5 tablets by mouth daily at bedtime as needed. 145 tablet 1  ? amLODipine (NORVASC) 5 MG tablet Take 1 tablet (5 mg total) by mouth  daily. 90 tablet 1  ? ?No facility-administered medications prior to visit.  ? ? ?No Known Allergies ? ?   ?Objective:  ?  ?BP 139/80   Pulse 75   Wt 177 lb 6.4 oz (80.5 kg)   SpO2 98%   BMI 25.45 kg/m?  ?Wt Readings from Last 3 Encounters:  ?07/26/21 177 lb 6.4 oz (80.5 kg)  ?02/22/21 181 lb (82.1 kg)  ?08/31/20 181 lb (82.1 kg)  ? ? ?Physical Exam ?Vitals and nursing note reviewed.  ?Constitutional:   ?   Appearance: He is well-developed.  ?HENT:  ?   Head: Normocephalic and atraumatic.  ?Cardiovascular:  ?   Rate and Rhythm: Normal rate and regular rhythm.  ?    Heart sounds: Normal heart sounds. No murmur heard. ?  No friction rub. No gallop.  ?Pulmonary:  ?   Effort: Pulmonary effort is normal. No tachypnea or respiratory distress.  ?   Breath sounds: Normal breath sounds. No decreased breath sounds, wheezing, rhonchi or rales.  ?Chest:  ?   Chest wall: No tenderness.  ?Abdominal:  ?   General: Bowel sounds are normal.  ?   Palpations: Abdomen is soft.  ?Musculoskeletal:     ?   General: Normal range of motion.  ?   Cervical back: Normal range of motion.  ?Skin: ?   General: Skin is warm and dry.  ?Neurological:  ?   Mental Status: He is alert and oriented to person, place, and time.  ?   Coordination: Coordination normal.  ?Psychiatric:     ?   Behavior: Behavior normal. Behavior is cooperative.     ?   Thought Content: Thought content normal.     ?   Judgment: Judgment normal.  ? ? ? ? ?   ?Patient has been counseled extensively about nutrition and exercise as well as the importance of adherence with medications and regular follow-up. The patient was given clear instructions to go to ER or return to medical center if symptoms don't improve, worsen or new problems develop. The patient verbalized understanding.  ? ?Follow-up: Return in about 3 months (around 10/25/2021).  ? ?Gildardo Pounds, FNP-BC ?[facility] ?[place], [place] ?[phone]   ?07/26/2021, 5:54 PM ?

## 2021-07-27 ENCOUNTER — Other Ambulatory Visit: Payer: Self-pay

## 2021-07-27 LAB — CBC WITH DIFFERENTIAL/PLATELET
Basophils Absolute: 0.1 10*3/uL (ref 0.0–0.2)
Basos: 1 %
EOS (ABSOLUTE): 0.2 10*3/uL (ref 0.0–0.4)
Eos: 2 %
Hematocrit: 40.7 % (ref 37.5–51.0)
Hemoglobin: 13.8 g/dL (ref 13.0–17.7)
Immature Grans (Abs): 0 10*3/uL (ref 0.0–0.1)
Immature Granulocytes: 0 %
Lymphocytes Absolute: 2.9 10*3/uL (ref 0.7–3.1)
Lymphs: 31 %
MCH: 30.1 pg (ref 26.6–33.0)
MCHC: 33.9 g/dL (ref 31.5–35.7)
MCV: 89 fL (ref 79–97)
Monocytes Absolute: 0.7 10*3/uL (ref 0.1–0.9)
Monocytes: 7 %
Neutrophils Absolute: 5.4 10*3/uL (ref 1.4–7.0)
Neutrophils: 59 %
Platelets: 319 10*3/uL (ref 150–450)
RBC: 4.59 x10E6/uL (ref 4.14–5.80)
RDW: 12.3 % (ref 11.6–15.4)
WBC: 9.2 10*3/uL (ref 3.4–10.8)

## 2021-07-27 LAB — CMP14+EGFR
ALT: 10 [IU]/L (ref 0–44)
AST: 15 [IU]/L (ref 0–40)
Albumin/Globulin Ratio: 2 (ref 1.2–2.2)
Albumin: 4.2 g/dL (ref 3.8–4.8)
Alkaline Phosphatase: 85 [IU]/L (ref 44–121)
BUN/Creatinine Ratio: 9 — ABNORMAL LOW (ref 10–24)
BUN: 9 mg/dL (ref 8–27)
Bilirubin Total: 0.2 mg/dL (ref 0.0–1.2)
CO2: 25 mmol/L (ref 20–29)
Calcium: 9.1 mg/dL (ref 8.6–10.2)
Chloride: 98 mmol/L (ref 96–106)
Creatinine, Ser: 1 mg/dL (ref 0.76–1.27)
Globulin, Total: 2.1 g/dL (ref 1.5–4.5)
Glucose: 141 mg/dL — ABNORMAL HIGH (ref 70–99)
Potassium: 3.9 mmol/L (ref 3.5–5.2)
Sodium: 140 mmol/L (ref 134–144)
Total Protein: 6.3 g/dL (ref 6.0–8.5)
eGFR: 84 mL/min/{1.73_m2}

## 2021-07-27 LAB — PSA: Prostate Specific Ag, Serum: 1523 ng/mL — ABNORMAL HIGH (ref 0.0–4.0)

## 2021-07-28 LAB — URINE CYTOLOGY ANCILLARY ONLY
Bacterial Vaginitis-Urine: NEGATIVE
Candida Urine: NEGATIVE
Chlamydia: NEGATIVE
Comment: NEGATIVE
Comment: NEGATIVE
Comment: NORMAL
Neisseria Gonorrhea: NEGATIVE
Trichomonas: NEGATIVE

## 2021-08-03 ENCOUNTER — Other Ambulatory Visit: Payer: Self-pay

## 2021-08-04 ENCOUNTER — Other Ambulatory Visit: Payer: Self-pay | Admitting: Nurse Practitioner

## 2021-08-04 DIAGNOSIS — R972 Elevated prostate specific antigen [PSA]: Secondary | ICD-10-CM

## 2021-08-07 ENCOUNTER — Other Ambulatory Visit: Payer: Self-pay

## 2021-08-14 ENCOUNTER — Telehealth: Payer: Self-pay | Admitting: Nurse Practitioner

## 2021-08-14 NOTE — Telephone Encounter (Signed)
-----   Message from Arabella Merles, RN sent at 08/14/2021 12:39 PM EDT ----- ?Called pt made aware of MD/PA/NP results note and instructions. Verbalized understanding and agreement. Sated he already spoke with specialist and once he will get the 500$ for the appt will schedule.  ?

## 2021-08-17 NOTE — Telephone Encounter (Signed)
Call placed to patient  # [phone] to inquire if he has applied for [facility] Financial Assistance/ [employer].  This is necessary for referral to specialists. The phone just rings, no option to leave a message.  ?Per Patient Assistance Funds, copays for physicians and bills for medical facilities are not acceptable expenses.  ? ?

## 2021-08-17 NOTE — Telephone Encounter (Signed)
Pt returned call, please advise  

## 2021-08-21 NOTE — Telephone Encounter (Signed)
Referral faxed to Novella Olive, Attorney/LANC ?

## 2021-08-21 NOTE — Telephone Encounter (Signed)
Call placed to patient. He explained that he has not been able to apply for CAFA/ [place] because he does not have any tax reports.  He has not filed taxes recently  and said that he needs to address his tax issues.  He has an income and should have filed taxes, he said he would not be eligible for a non-filing letter. He was in agreement to submitting a referral to [employer] [place] for assistance/ guidance regarding his tax issues. I explained that they may not be able to assist him but may be able to recommend who he can contact for help. He said he understood ?

## 2021-08-24 ENCOUNTER — Other Ambulatory Visit: Payer: Self-pay

## 2021-08-25 ENCOUNTER — Encounter: Payer: Self-pay | Admitting: Urology

## 2021-08-25 ENCOUNTER — Ambulatory Visit (INDEPENDENT_AMBULATORY_CARE_PROVIDER_SITE_OTHER): Payer: Self-pay | Admitting: Urology

## 2021-08-25 ENCOUNTER — Other Ambulatory Visit: Payer: Self-pay

## 2021-08-25 VITALS — BP 135/72 | HR 88

## 2021-08-25 DIAGNOSIS — R972 Elevated prostate specific antigen [PSA]: Secondary | ICD-10-CM

## 2021-08-25 LAB — URINALYSIS, ROUTINE W REFLEX MICROSCOPIC
Bilirubin, UA: NEGATIVE
Glucose, UA: NEGATIVE
Ketones, UA: NEGATIVE
Leukocytes,UA: NEGATIVE
Nitrite, UA: NEGATIVE
Protein,UA: NEGATIVE
RBC, UA: NEGATIVE
Specific Gravity, UA: 1.025 (ref 1.005–1.030)
Urobilinogen, Ur: 0.2 mg/dL (ref 0.2–1.0)
pH, UA: 6 (ref 5.0–7.5)

## 2021-08-25 MED ORDER — LEVOFLOXACIN 750 MG PO TABS
750.0000 mg | ORAL_TABLET | Freq: Once | ORAL | 0 refills | Status: AC
  Filled 2021-08-25: qty 1, 1d supply, fill #0

## 2021-08-25 NOTE — Progress Notes (Signed)
post void residual=5 

## 2021-08-25 NOTE — Progress Notes (Signed)
08/25/2021 11:25 AM   Tyler Pratt April 29, 1955 [id number]  Referring provider: Gildardo Pounds, NP [place],  [place] [address]  Elevated PSA  HPI: Mr Stgermaine is a 66yo here for evaluation of elevated PSA. His PSA was 600 1 year ago and doubled to 1200 6 months ago and recently it is 1500. No bone pain. IPSS 23 QOL 4 on no BPH therapy. His uncle died recently of prostate cancer.    PMH: Past Medical History:  Diagnosis Date   High cholesterol     Surgical History: No past surgical history on file.  Home Medications:  Allergies as of 08/25/2021   No Known Allergies      Medication List        Accurate as of Aug 25, 2021 11:25 AM. If you have any questions, ask your nurse or doctor.          amLODipine 10 MG tablet Commonly known as: NORVASC Take 1 tablet (10 mg total) by mouth daily.   ezetimibe 10 MG tablet Commonly known as: Zetia Take 1 tablet (10 mg total) by mouth daily.   hydrochlorothiazide 50 MG tablet Commonly known as: HYDRODIURIL Take 1 tablet (50 mg total) by mouth daily.   sulfamethoxazole-trimethoprim 800-160 MG tablet Commonly known as: BACTRIM DS Take 1 tablet by mouth 2 (two) times daily.   traZODone 100 MG tablet Commonly known as: DESYREL Take 1 -1.5 tablets by mouth daily at bedtime as needed.        Allergies: No Known Allergies  Family History: Family History  Problem Relation Age of Onset   Hypertension Mother    Cancer Mother    Hypertension Father    Cancer Father    Diabetes Maternal Grandmother    Heart disease Paternal Grandfather     Social History:  reports that he has been smoking cigarettes. He has a 40.00 pack-year smoking history. He has never used smokeless tobacco. He reports current alcohol use. He reports current drug use. Drug: Cocaine.  ROS: All other review of systems were reviewed and are negative except what is noted above in HPI  Physical Exam: BP 135/72   Pulse 88    Constitutional:  Alert and oriented, No acute distress. HEENT: [place] AT, moist mucus membranes.  Trachea midline, no masses. Cardiovascular: No clubbing, cyanosis, or edema. Respiratory: Normal respiratory effort, no increased work of breathing. GI: Abdomen is soft, nontender, nondistended, no abdominal masses GU: No CVA tenderness. Circumcised phallus. No masses/lesions on penis, testis, scrotum. Prostate 40g smooth no nodules no induration.  Lymph: No cervical or inguinal lymphadenopathy. Skin: No rashes, bruises or suspicious lesions. Neurologic: Grossly intact, no focal deficits, moving all 4 extremities. Psychiatric: Normal mood and affect.  Laboratory Data: Lab Results  Component Value Date   WBC 9.2 07/26/2021   HGB 13.8 07/26/2021   HCT 40.7 07/26/2021   MCV 89 07/26/2021   PLT 319 07/26/2021    Lab Results  Component Value Date   CREATININE 1.00 07/26/2021    No results found for: PSA  No results found for: TESTOSTERONE  Lab Results  Component Value Date   HGBA1C 5.2 08/23/2020    Urinalysis    Component Value Date/Time   COLORURINE AMBER (A) 10/25/2013 1211   APPEARANCEUR CLEAR 10/25/2013 1211   LABSPEC 1.034 (H) 10/25/2013 1211   PHURINE 5.5 10/25/2013 1211   GLUCOSEU NEGATIVE 10/25/2013 1211   HGBUR NEGATIVE 10/25/2013 1211   BILIRUBINUR SMALL (A) 10/25/2013  [place] 10/25/2013 [place] 10/25/2013 1211   UROBILINOGEN 1.0 10/25/2013 1211   NITRITE NEGATIVE 10/25/2013 1211   LEUKOCYTESUR TRACE (A) 10/25/2013 1211    Lab Results  Component Value Date   BACTERIA FEW (A) 10/25/2013    Pertinent Imaging:  No results found for this or any previous visit.  No results found for this or any previous visit.  No results found for this or any previous visit.  No results found for this or any previous visit.  No results found for this or any previous visit.  No results found for this or any previous visit.  No  results found for this or any previous visit.  No results found for this or any previous visit.   Assessment & Plan:    1. Elevated PSA The patient and I talked about etiologies of elevated PSA.  We discussed the possible relationship between elevated PSA, prostate cancer, BPH, prostatitis, and UTI.   Conservative treatment of elevated PSA with watchful waiting was discussed with the patient.  All questions were answered.        All of the risks and benefits along with alternatives to prostate biopsy were discussed with the patient.  The patient gave fully informed consent to proceed with a transrectal ultrasound guided biopsy of the prostate for the evaluation of their evated PSA.  Prostate biopsy instructions and antibiotics were given to the patient.  - BLADDER SCAN AMB NON-IMAGING - Urinalysis, Routine w reflex microscopic   No follow-ups on file.  Nicolette Bang, MD  [facility] [place]

## 2021-08-25 NOTE — Patient Instructions (Addendum)
Transrectal Ultrasound-Guided Prostate Biopsy ?A transrectal ultrasound-guided prostate biopsy is a procedure to remove samples of prostate tissue for testing. The prostate is a walnut-sized gland that is located below the bladder and in front of the rectum. During this procedure, a small device (probe) is lubricated and put inside the rectum. The probe sends out sound waves that make a picture of the prostate and surrounding tissues (transrectal ultrasound). The images are used to help guide the process of removing the samples. The samples are taken to a lab to be checked for prostate cancer. ?This procedure is usually done to evaluate the prostate gland of men who have raised (elevated) levels of prostate-specific antigen (PSA), which can be a sign of prostate cancer or prostate enlargement related to aging (benign prostatic hyperplasia, or BPH). ?Tell a health care provider about: ?Any allergies you have. ?All medicines you are taking, including vitamins, herbs, eye drops, creams, and over-the-counter medicines. ?Any problems you or family members have had with anesthetic medicines. ?Any bleeding problems you have. ?Any surgeries you have had. ?Any medical conditions you have. ?Any prostate infections you have had. ?What are the risks? ?Generally, this is a safe procedure. However, problems may occur, including: ?Prostate infection. ?Bleeding from the rectum. ?Blood in the urine. ?Allergic reactions to medicines. ?Damage to surrounding structures such as blood vessels, organs, or muscles. ?Difficulty passing urine. ?Nerve damage. This is usually temporary. ?What happens before the procedure? ?Medicines ?Ask your health care provider about: ?Changing or stopping your regular medicines. This is especially important if you are taking diabetes medicines or blood thinners. ?Taking medicines such as aspirin and ibuprofen. These medicines can thin your blood. Do not take these medicines unless your health care provider  tells you to take them. ?Taking over-the-counter medicines, vitamins, herbs, and supplements. ?General instructions ?Follow instructions from your health care provider about eating and drinking. In most instances, you will not need to stop eating and drinking completely before the procedure. ?You will be given an enema. During an enema, a liquid is injected into your rectum to clear out waste. ?You may have a blood or urine sample taken. ?Ask your health care provider what steps will be taken to help prevent infection. These steps may include: ?Washing skin with a germ-killing soap. ?Taking antibiotic medicine. ?If you will be going home right after the procedure, plan to have a responsible adult: ?Take you home from the hospital or clinic. You will not be allowed to drive. ?Care for you for the time you are told. ?What happens during the procedure? ? ?An IV will be inserted into one of your veins. ?You will be given one or both of the following: ?A medicine to help you relax (sedative). ?A medicine to numb the area (local anesthetic). ?You will be placed on your left side, and your knees will be bent toward your chest. ?A probe with lubricated gel will be placed into your rectum, and images will be taken of your prostate and surrounding structures. ?Numbing medicine will be injected into your prostate. ?A biopsy needle will be inserted through your rectum or perineum and guided to your prostate using the ultrasound images. ?Prostate tissue samples will be removed, and the needle and probe will then be removed. ?The biopsy samples will be sent to a lab to be tested. ?The procedure may vary among health care providers and hospitals. ?What happens after the procedure? ?Your blood pressure, heart rate, breathing rate, and blood oxygen level will be monitored until you leave  the hospital or clinic. ?You may have some discomfort in the rectal area. You will be given pain medicine as needed. ?If you were given a sedative  during the procedure, it can affect you for several hours. Do not drive or operate machinery until your health care provider says that it is safe. ?It is up to you to get the results of your procedure. Ask your health care provider, or the department that is doing the procedure, when your results will be ready. ?Keep all follow-up visits. This is important. ?Summary ?A transrectal ultrasound-guided biopsy removes samples of tissue from your prostate using ultrasound-guided sound waves to help guide the process. ?This procedure is usually done to evaluate the prostate gland of men who have raised (elevated) levels of prostate-specific antigen (PSA), which can be a sign of prostate cancer or prostate enlargement related to aging. ?After your procedure, you may feel some discomfort in the rectal area. ?Plan to have a responsible adult take you home from the hospital or clinic, and follow up with your health care provider for your results. ?This information is not intended to replace advice given to you by your health care provider. Make sure you discuss any questions you have with your health care provider. ?Document Revised: 09/26/2020 Document Reviewed: 09/26/2020 ?Elsevier Patient Education ? [place]. ? ? ? ? ?Appointment Time:12:15 ?Appointment Date:09/06/21 ? ?Location: [facility]  ? ?Prostate Biopsy Instructions ? ?Stop all aspirin or blood thinners (aspirin, plavix, coumadin, warfarin, motrin, ibuprofen, advil, aleve, naproxen, naprosyn) for 7 days prior to the procedure.  If you have any questions about stopping these medications, please contact your primary care physician or cardiologist. ? ?Having a light meal prior to the procedure is recommended.  If you are diabetic or have low blood sugar please bring a small snack or glucose tablet. ? ?A Fleets enema is needed to be purchased over the counter at a local pharmacy and used 2 hours before you scheduled appointment.  This can  be purchased over the counter at any pharmacy. ? ?Antibiotics will be administered in the clinic at the time of the procedure and 1 tablet has been sent to your pharmacy. Please take the antibiotic as prescribed.   ? ?Please bring someone with you to the procedure to drive you home if you are given a valium to take prior to your procedure. ? ? ?If you have any questions or concerns, please feel free to call the office at [phone] or send a Mychart message. ? ? ? ?Thank you, ?[facility]  ?

## 2021-08-28 NOTE — Telephone Encounter (Signed)
He was seen at [facility] [place] 08/25/2021  ?

## 2021-08-30 ENCOUNTER — Other Ambulatory Visit: Payer: Self-pay

## 2021-09-06 ENCOUNTER — Ambulatory Visit (HOSPITAL_BASED_OUTPATIENT_CLINIC_OR_DEPARTMENT_OTHER): Payer: Self-pay | Admitting: Urology

## 2021-09-06 ENCOUNTER — Ambulatory Visit (HOSPITAL_COMMUNITY)
Admission: RE | Admit: 2021-09-06 | Discharge: 2021-09-06 | Disposition: A | Discharge status: Home / Self Care | Payer: Self-pay | Source: Ambulatory Visit | Attending: Urology | Admitting: Urology

## 2021-09-06 ENCOUNTER — Encounter: Payer: Self-pay | Admitting: Urology

## 2021-09-06 ENCOUNTER — Encounter (HOSPITAL_COMMUNITY): Payer: Self-pay

## 2021-09-06 ENCOUNTER — Other Ambulatory Visit: Payer: Self-pay | Admitting: Urology

## 2021-09-06 DIAGNOSIS — R972 Elevated prostate specific antigen [PSA]: Secondary | ICD-10-CM

## 2021-09-06 DIAGNOSIS — C61 Malignant neoplasm of prostate: Secondary | ICD-10-CM

## 2021-09-06 MED ORDER — LIDOCAINE HCL (PF) 2 % IJ SOLN: 10.0000 mL | Freq: Once | INTRAMUSCULAR | Status: AC

## 2021-09-06 MED ORDER — LIDOCAINE HCL (PF) 2 % IJ SOLN
INTRAMUSCULAR | Status: AC
  Administered 2021-09-06: 10 mL
  Filled 2021-09-06: qty 10

## 2021-09-06 MED ORDER — GENTAMICIN SULFATE 40 MG/ML IJ SOLN
INTRAMUSCULAR | Status: AC
  Administered 2021-09-06: 80 mg via INTRAMUSCULAR
  Filled 2021-09-06: qty 2

## 2021-09-06 MED ORDER — GENTAMICIN SULFATE 40 MG/ML IJ SOLN: 80.0000 mg | Freq: Once | INTRAMUSCULAR | Status: AC

## 2021-09-06 NOTE — Progress Notes (Signed)
Prostate Biopsy Procedure   Informed consent was obtained after discussing risks/benefits of the procedure.  A time out was performed to ensure correct patient identity.  Pre-Procedure: - Last PSA Level: 1500 - Gentamicin given prophylactically - Levaquin 500 mg administered PO -Transrectal Ultrasound performed revealing a 55.01 gm prostate -No significant hypoechoic or median lobe noted  Procedure: - Prostate block performed using 10 cc 1% lidocaine and biopsies taken from sextant areas, a total of 12 under ultrasound guidance.  Post-Procedure: - Patient tolerated the procedure well - He was counseled to seek immediate medical attention if experiences any severe pain, significant bleeding, or fevers - Return in one week to discuss biopsy results

## 2021-09-06 NOTE — Progress Notes (Signed)
PT tolerated prostate biopsy procedure and antibiotic injection well today. Labs obtained and sent for pathology. PT ambulatory at discharge with no acute distress noted and verbalized understanding of discharge instructions. PT to follow up with urologist as scheduled on 09/13/21 at 4:00 pm.

## 2021-09-07 ENCOUNTER — Other Ambulatory Visit: Payer: Self-pay

## 2021-09-13 ENCOUNTER — Ambulatory Visit (INDEPENDENT_AMBULATORY_CARE_PROVIDER_SITE_OTHER): Payer: Self-pay | Admitting: Urology

## 2021-09-13 ENCOUNTER — Encounter: Payer: Self-pay | Admitting: Urology

## 2021-09-13 VITALS — BP 131/70 | HR 80

## 2021-09-13 DIAGNOSIS — R972 Elevated prostate specific antigen [PSA]: Secondary | ICD-10-CM

## 2021-09-13 DIAGNOSIS — C61 Malignant neoplasm of prostate: Secondary | ICD-10-CM

## 2021-09-13 NOTE — Progress Notes (Signed)
09/13/2021 4:30 PM   Tyler Pratt February 23, 1956 [id number]  Referring provider: Gildardo Pounds, NP [place] [place],  [place] [address]  Followup after prostate biopsy   HPI: [name] is a 66yo here for followup after prostate biopsy. Biopsy revelaed Gleason $+3=7 in 11/12 cores and 4+5=9 in 1/12 cores. No bone pain. No significant LUTS.    PMH: Past Medical History:  Diagnosis Date   High cholesterol     Surgical History: No past surgical history on file.  Home Medications:  Allergies as of 09/13/2021   No Known Allergies      Medication List        Accurate as of Sep 13, 2021  4:30 PM. If you have any questions, ask your nurse or doctor.          amLODipine 10 MG tablet Commonly known as: NORVASC Take 1 tablet (10 mg total) by mouth daily.   ezetimibe 10 MG tablet Commonly known as: Zetia Take 1 tablet (10 mg total) by mouth daily.   hydrochlorothiazide 50 MG tablet Commonly known as: HYDRODIURIL Take 1 tablet (50 mg total) by mouth daily.   traZODone 100 MG tablet Commonly known as: DESYREL Take 1 -1.5 tablets by mouth daily at bedtime as needed.        Allergies: No Known Allergies  Family History: Family History  Problem Relation Age of Onset   Hypertension Mother    Cancer Mother    Hypertension Father    Cancer Father    Diabetes Maternal Grandmother    Heart disease Paternal Grandfather     Social History:  reports that he has been smoking cigarettes. He has a 40.00 pack-year smoking history. He has never used smokeless tobacco. He reports current alcohol use. He reports current drug use. Drug: Cocaine.  ROS: All other review of systems were reviewed and are negative except what is noted above in HPI  Physical Exam: BP 131/70   Pulse 80   Constitutional:  Alert and oriented, No acute distress. HEENT: [place] AT, moist mucus membranes.  Trachea midline, no masses. Cardiovascular: No clubbing, cyanosis, or  edema. Respiratory: Normal respiratory effort, no increased work of breathing. GI: Abdomen is soft, nontender, nondistended, no abdominal masses GU: No CVA tenderness.  Lymph: No cervical or inguinal lymphadenopathy. Skin: No rashes, bruises or suspicious lesions. Neurologic: Grossly intact, no focal deficits, moving all 4 extremities. Psychiatric: Normal mood and affect.  Laboratory Data: Lab Results  Component Value Date   WBC 9.2 07/26/2021   HGB 13.8 07/26/2021   HCT 40.7 07/26/2021   MCV 89 07/26/2021   PLT 319 07/26/2021    Lab Results  Component Value Date   CREATININE 1.00 07/26/2021    No results found for: PSA  No results found for: TESTOSTERONE  Lab Results  Component Value Date   HGBA1C 5.2 08/23/2020    Urinalysis    Component Value Date/Time   COLORURINE AMBER (A) 10/25/2013 1211   APPEARANCEUR Clear 08/25/2021 1111   LABSPEC 1.034 (H) 10/25/2013 1211   PHURINE 5.5 10/25/2013 1211   GLUCOSEU Negative 08/25/2021 1111   HGBUR NEGATIVE 10/25/2013 1211   BILIRUBINUR Negative 08/25/2021 1111   KETONESUR NEGATIVE 10/25/2013 1211   PROTEINUR Negative 08/25/2021 1111   PROTEINUR NEGATIVE 10/25/2013 1211   UROBILINOGEN 1.0 10/25/2013 1211   NITRITE Negative 08/25/2021 1111   NITRITE NEGATIVE 10/25/2013 1211   LEUKOCYTESUR Negative 08/25/2021 1111    Lab Results  Component Value  Date   LABMICR Comment 08/25/2021   BACTERIA FEW (A) 10/25/2013    Pertinent Imaging:  No results found for this or any previous visit.  No results found for this or any previous visit.  No results found for this or any previous visit.  No results found for this or any previous visit.  No results found for this or any previous visit.  No results found for this or any previous visit.  No results found for this or any previous visit.  No results found for this or any previous visit.   Assessment & Plan:    1. Prostate Cancer BMP CT Abd/pelvis Bone  Scan   No follow-ups on file.  Nicolette Bang, MD  [facility] [place]

## 2021-09-13 NOTE — Patient Instructions (Signed)

## 2021-09-14 ENCOUNTER — Other Ambulatory Visit: Payer: Self-pay

## 2021-09-14 LAB — BASIC METABOLIC PANEL
BUN/Creatinine Ratio: 10 (ref 10–24)
BUN: 12 mg/dL (ref 8–27)
CO2: 23 mmol/L (ref 20–29)
Calcium: 9 mg/dL (ref 8.6–10.2)
Chloride: 99 mmol/L (ref 96–106)
Creatinine, Ser: 1.16 mg/dL (ref 0.76–1.27)
Glucose: 165 mg/dL — ABNORMAL HIGH (ref 70–99)
Potassium: 3.8 mmol/L (ref 3.5–5.2)
Sodium: 139 mmol/L (ref 134–144)
eGFR: 70 mL/min/{1.73_m2} (ref >59)

## 2021-09-22 ENCOUNTER — Other Ambulatory Visit: Payer: Self-pay

## 2021-09-25 ENCOUNTER — Ambulatory Visit (HOSPITAL_COMMUNITY)
Admission: RE | Admit: 2021-09-25 | Discharge: 2021-09-25 | Disposition: A | Discharge status: Home / Self Care | Payer: Self-pay | Source: Ambulatory Visit | Attending: Urology | Admitting: Urology

## 2021-09-25 ENCOUNTER — Encounter (HOSPITAL_COMMUNITY)
Admission: RE | Admit: 2021-09-25 | Discharge: 2021-09-25 | Disposition: A | Discharge status: Home / Self Care | Payer: Self-pay | Source: Ambulatory Visit | Attending: Urology | Admitting: Urology

## 2021-09-25 DIAGNOSIS — C61 Malignant neoplasm of prostate: Secondary | ICD-10-CM | POA: Insufficient documentation

## 2021-09-25 MED ORDER — IOHEXOL 300 MG/ML  SOLN
100.0000 mL | Freq: Once | INTRAMUSCULAR | Status: AC | PRN
  Administered 2021-09-25: 100 mL via INTRAVENOUS

## 2021-09-25 MED ORDER — SODIUM CHLORIDE (PF) 0.9 % IJ SOLN
INTRAMUSCULAR | Status: AC
  Filled 2021-09-25: qty 50

## 2021-09-25 MED ORDER — TECHNETIUM TC 99M MEDRONATE IV KIT
20.0000 | PACK | Freq: Once | INTRAVENOUS | Status: AC | PRN
  Administered 2021-09-25: 20 via INTRAVENOUS

## 2021-10-04 ENCOUNTER — Telehealth: Payer: Self-pay

## 2021-10-04 NOTE — Telephone Encounter (Signed)
Appointment made. Patient made aware.

## 2021-10-04 NOTE — Telephone Encounter (Signed)
-----   Message from Reynaldo Minium, [place] sent at 10/04/2021  5:01 PM EDT ----- Please let pt know that he needs appt to talk with Dr. Alyson Ingles about his MRI and bone scan results. ----- Message ----- From: Iris Pert, LPN Sent: [id number]/[id number]   8:00 AM EDT To: Cleon Gustin, MD; #  Please review in Dr. Noland Fordyce absence

## 2021-10-04 NOTE — Telephone Encounter (Signed)
Patient called and left a voicemail this morning wanting someone to contact him to discuss his imaging results.    Thank you!

## 2021-10-04 NOTE — Telephone Encounter (Signed)
Appointment made to discuss results with MD. Patient made aware.

## 2021-10-06 ENCOUNTER — Other Ambulatory Visit: Payer: Self-pay

## 2021-10-06 ENCOUNTER — Other Ambulatory Visit: Payer: Self-pay | Admitting: Physician Assistant

## 2021-10-06 DIAGNOSIS — I10 Essential (primary) hypertension: Secondary | ICD-10-CM

## 2021-10-06 MED ORDER — HYDROCHLOROTHIAZIDE 50 MG PO TABS
50.0000 mg | ORAL_TABLET | Freq: Every day | ORAL | 0 refills | Status: DC
  Filled 2021-10-06: qty 90, 90d supply, fill #0

## 2021-10-09 ENCOUNTER — Encounter: Payer: Self-pay | Admitting: Urology

## 2021-10-09 ENCOUNTER — Ambulatory Visit (INDEPENDENT_AMBULATORY_CARE_PROVIDER_SITE_OTHER): Payer: Self-pay | Admitting: Urology

## 2021-10-09 ENCOUNTER — Other Ambulatory Visit: Payer: Self-pay

## 2021-10-09 VITALS — BP 147/71 | HR 85

## 2021-10-09 DIAGNOSIS — C775 Secondary and unspecified malignant neoplasm of intrapelvic lymph nodes: Secondary | ICD-10-CM

## 2021-10-09 DIAGNOSIS — C61 Malignant neoplasm of prostate: Secondary | ICD-10-CM

## 2021-10-09 MED ORDER — BICALUTAMIDE 50 MG PO TABS
50.0000 mg | ORAL_TABLET | Freq: Every day | ORAL | 2 refills | Status: DC
  Filled 2021-10-09: qty 30, 30d supply, fill #0

## 2021-10-10 ENCOUNTER — Other Ambulatory Visit: Payer: Self-pay

## 2021-10-10 ENCOUNTER — Telehealth: Payer: Self-pay | Admitting: Oncology

## 2021-10-11 NOTE — Progress Notes (Signed)
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  Staging.  FINDINGS: Lower Chest: No acute findings.   Hepatobiliary: No hepatic masses identified. Gallbladder is unremarkable. No evidence of biliary ductal dilatation.   Pancreas:  No mass or inflammatory changes.   Spleen: Within normal limits in size and appearance.   Adrenals/Urinary Tract: No masses identified. No evidence of ureteral calculi or hydronephrosis.   Stomach/Bowel: No evidence of obstruction, inflammatory process or abnormal fluid collections. Normal appendix visualized.   Vascular/Lymphatic: No acute vascular findings. Aortic atherosclerotic calcification incidentally noted. Pelvic lymphadenopathy is seen throughout the left iliac chains, with largest lymph node measuring 2.2 cm short axis on image 55/7.  Retroperitoneal lymphadenopathy is seen throughout the left paraaortic region, with largest lymph node measuring 3.4 cm on image 34/7. Mild aortocaval lymph node measures 1.2 cm on image 37/7. Mild left retrocrural lymphadenopathy is seen, measuring 1.2 cm on image 16/7.   Reproductive:  Mildly enlarged prostate gland.   Other:  None.   Musculoskeletal: 1 cm sclerotic bone lesion is seen in the T12 vertebral body. A small sclerotic bone metastasis cannot be excluded.   IMPRESSION: Mildly enlarged prostate gland.  Abdominal and pelvic lymphadenopathy, consistent with metastatic disease.  1 cm sclerotic bone lesion in the T12 vertebral body, nonspecific.  Sclerotic bone metastasis cannot be excluded.  Aortic Atherosclerosis (ICD10-I70.0).  09/13/2021 NM Scan Whole Body CLINICAL DATA: History of prostate cancer   FINDINGS: Bilateral renal function and excretion. Focal areas of increased activity noted over the  cervical spine and upper thoracic spine.  Although these changes could be degenerative, MRI of the cervical and thoracic spine suggested for further evaluation given the patient's history of prostate cancer. No other focal bony abnormalities identified.   IMPRESSION: Focal areas of increased activity noted over the cervical spine and upper thoracic spine. Although these changes could be degenerative, MRI of the cervical and thoracic spine suggested for further evaluation given the patient's history of prostate cancer.  04/14/2013 DG Lumbar Spine CLINICAL DATA:  Back and left leg pain.    EXAM:  LUMBAR SPINE - 2-3 VIEW    COMPARISON:  None.    FINDINGS:  Normal alignment of the lumbar vertebral bodies. No acute bony findings or destructive bony changes. The disc spaces are  maintained. Mild facet degenerative changes but no definite pars defects. Aortoiliac artery calcifications are noted. The visualized bony pelvis is intact.    IMPRESSION:  Normal alignment and no acute bony findings.    01/14/2010 DG Lumbar Spine Complete Clinical Data: Right leg and hip pain.     LUMBAR SPINE - COMPLETE 4+ VIEW     Comparison: None.     Findings: Lumbar spine demonstrates normal alignment without  subluxation or fracture.  Very minimal disc space narrowing at L5- S1.  No bony lesions.  There is some sclerosis of the superior  aspect of the right sacroiliac joint.     IMPRESSION:  Minimal disc space narrowing at L5-S1.  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   Sites of Visceral and Bony Metastatic Disease: Thoracic Spine  Location(s) of Symptomatic Metastases: Thoracic Spine  Past/Anticipated chemotherapy by medical oncology, if any: NA  Pain on a scale of 0-10 is:  0  IPSS: 29 SHIM:  14   If Spine  Met(s), symptoms, if any, include: Bowel/Bladder retention or incontinence (please describe): Urinary urgency and no bowel issues at this time. Numbness or  weakness in extremities (please describe): No Current Decadron regimen, if applicable: No  Ambulatory status? Walker? Wheelchair?: Ambulatory  SAFETY ISSUES: Prior radiation? No Pacemaker/ICD? No Possible current pregnancy? Male Is the patient on methotrexate? No  Current Complaints / other details:

## 2021-10-15 DIAGNOSIS — C419 Malignant neoplasm of bone and articular cartilage, unspecified: Secondary | ICD-10-CM | POA: Insufficient documentation

## 2021-10-15 DIAGNOSIS — C61 Malignant neoplasm of prostate: Secondary | ICD-10-CM | POA: Insufficient documentation

## 2021-10-15 NOTE — Progress Notes (Signed)
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 Malignant neoplasm of prostate ([place])  C61     2. Primary cancer of bone with metastasis to other site [facility])  C41.9       HISTORY OF PRESENT ILLNESS::Tyler Pratt is a 66 y.o. male who was noted to have an elevated PSA of 621.0 on 08/24/20.  He was referred to [facility].  Unfortunately, he could not afford the $200 copay that urology required up front. PSA has doubled in 6 months rising to 1203.0 on 02/22/21. He states he was diagnosed with BPH over 10 years ago.  He was able to arrange [employer] .  Repeat PSA on 07/26/21 was 1523.0.  He was seen by Dr. Alyson Ingles on 08/25/21, and DRE revealed Prostate 40g smooth no nodules no induration.  The patient proceeded to transrectal ultrasound with 12 biopsies of the prostate on 09/06/21.  The prostate volume measured 55 cc.  Out of 12 core biopsies, 12 were positive.  The maximum Gleason score was 4+5, and this was seen in right lateral apex.   CT abdomen pelvis on 09/25/21 revealed a Mildly enlarged prostate gland and Abdominal and pelvic lymphadenopathy, consistent with metastatic disease.  On my review, there appear to be multiple intrathoracic paraspinal lymph nodes as well above the diaphragm.  There was also a 1 cm sclerotic bone lesion in the T12 vertebral body, nonspecific.  Sclerotic bone metastasis cannot be excluded.   Bone scan on 09/25/21 showed Focal areas of increased activity noted over the cervical spine and upper thoracic spine. Although these changes could be degenerative, MRI of the cervical and thoracic  spine suggested for further evaluation given the patient's history of prostate cancer.    The patient reviewed the biopsy results with his urologist and he has kindly been referred today for discussion of potential radiation treatment options.  He'll meet Dr. Alen Blew on 11/01/21.   PREVIOUS RADIATION THERAPY: No  Past Medical History:  Diagnosis Date   High cholesterol   :  No past surgical history on file.:   Current Outpatient Medications:    amLODipine (NORVASC) 10 MG tablet, Take 1 tablet (10 mg total) by mouth daily., Disp: 90 tablet, Rfl: 1   ezetimibe (ZETIA) 10 MG tablet, Take 1 tablet (10 mg total) by mouth daily., Disp: 90 tablet, Rfl: 3   hydrochlorothiazide (HYDRODIURIL) 50 MG tablet, Take 1 tablet (50 mg total) by mouth daily., Disp: 90 tablet, Rfl: 0   traZODone (DESYREL) 100 MG tablet, Take 1 -1.5 tablets by mouth daily at bedtime as needed., Disp: 145 tablet, Rfl: 1:  No Known Allergies:   Family History  Problem Relation Age of Onset   Hypertension Mother    Cancer Mother    Hypertension Father    Cancer Father    Diabetes Maternal Grandmother    Heart disease Paternal Grandfather   :   Social History   Socioeconomic History   Marital status: Single    Spouse name: Not on file   Number of children: Not on file   Years of education: Not on file  Highest education level: Not on file  Occupational History   Not on file  Tobacco Use   Smoking status: Every Day    Packs/day: 1.00    Years: 40.00    Total pack years: 40.00    Types: Cigarettes   Smokeless tobacco: Never  Vaping Use   Vaping Use: Never used  Substance and Sexual Activity   Alcohol use: Yes    Comment: weekly    Drug use: Yes    Types: Cocaine    Comment: crack   Sexual activity: Not on file  Other Topics Concern   Not on file  Social History Narrative   Not on file   Social Determinants of Health   Financial Resource Strain: Not on file  Food Insecurity: Not on file   Transportation Needs: Not on file  Physical Activity: Not on file  Stress: Not on file  Social Connections: Not on file  Intimate Partner Violence: Not on file  :  REVIEW OF SYSTEMS:  A 15 point review of systems is documented in the electronic medical record. This was obtained by the nursing staff. However, I reviewed this with the patient to discuss relevant findings and make appropriate changes.  Pertinent items are noted in HPI.   PHYSICAL EXAM:  Alert and oriented no acute distress. Appropriate, neuro non-focal  KPS = 100  100 - Normal; no complaints; no evidence of disease. 90   - Able to carry on normal activity; minor signs or symptoms of disease. 80   - Normal activity with effort; some signs or symptoms of disease. 9   - Cares for self; unable to carry on normal activity or to do active work. 60   - Requires occasional assistance, but is able to care for most of his personal needs. 50   - Requires considerable assistance and frequent medical care. 74   - Disabled; requires special care and assistance. 71   - Severely disabled; hospital admission is indicated although death not imminent. 18   - Very sick; hospital admission necessary; active supportive treatment necessary. 10   - Moribund; fatal processes progressing rapidly. 0     - Dead  Karnofsky DA, Abelmann [place], Craver LS and Burchenal [facility] [id number]) The use of the nitrogen mustards in the palliative treatment of carcinoma: with particular reference to bronchogenic carcinoma Cancer 1 634-56  LABORATORY DATA:  Lab Results  Component Value Date   WBC 9.2 07/26/2021   HGB 13.8 07/26/2021   HCT 40.7 07/26/2021   MCV 89 07/26/2021   PLT 319 07/26/2021   Lab Results  Component Value Date   NA 139 09/13/2021   K 3.8 09/13/2021   CL 99 09/13/2021   CO2 23 09/13/2021   Lab Results  Component Value Date   ALT 10 07/26/2021   AST 15 07/26/2021   ALKPHOS 85 07/26/2021   BILITOT 0.2 07/26/2021     RADIOGRAPHY: CT  Abdomen Pelvis W Wo Contrast  Result Date: 09/26/2021 CLINICAL DATA:  Newly diagnosed high-risk prostate carcinoma. Staging. * Tracking Code: BO * EXAM: CT ABDOMEN AND PELVIS WITHOUT AND WITH CONTRAST TECHNIQUE: Multidetector CT imaging of the abdomen and pelvis was performed following the standard protocol before and following the bolus administration of intravenous contrast. RADIATION DOSE REDUCTION: This exam was performed according to the departmental dose-optimization program which includes automated exposure control, adjustment of the mA and/or kV according to patient size and/or use of iterative reconstruction technique. CONTRAST:  136m OMNIPAQUE IOHEXOL 300 MG/ML  SOLN COMPARISON:  None Available. FINDINGS: Lower Chest: No acute findings. Hepatobiliary: No hepatic masses identified. Gallbladder is unremarkable. No evidence of biliary ductal dilatation. Pancreas:  No mass or inflammatory changes. Spleen: Within normal limits in size and appearance. Adrenals/Urinary Tract: No masses identified. No evidence of ureteral calculi or hydronephrosis. Stomach/Bowel: No evidence of obstruction, inflammatory process or abnormal fluid collections. Normal appendix visualized. Vascular/Lymphatic: No acute vascular findings. Aortic atherosclerotic calcification incidentally noted. Pelvic lymphadenopathy is seen throughout the left iliac chains, with largest lymph node measuring 2.2 cm short axis on image 55/7. Retroperitoneal lymphadenopathy is seen throughout the left paraaortic region, with largest lymph node measuring 3.4 cm on image 34/7. Mild aortocaval lymph node measures 1.2 cm on image 37/7. Mild left retrocrural lymphadenopathy is seen, measuring 1.2 cm on image 16/7. Reproductive:  Mildly enlarged prostate gland. Other:  None. Musculoskeletal: 1 cm sclerotic bone lesion is seen in the T12 vertebral body. A small sclerotic bone metastasis cannot be excluded. IMPRESSION: Mildly enlarged prostate gland. Abdominal  and pelvic lymphadenopathy, consistent with metastatic disease. 1 cm sclerotic bone lesion in the T12 vertebral body, nonspecific. Sclerotic bone metastasis cannot be excluded. Aortic Atherosclerosis (ICD10-I70.0). Electronically Signed   By: Marlaine Hind M.D.   On: 09/26/2021 16:39   NM Bone Scan Whole Body  Result Date: 09/26/2021 CLINICAL DATA:  History of prostate cancer. EXAM: NUCLEAR MEDICINE WHOLE BODY BONE SCAN TECHNIQUE: Whole body anterior and posterior images were obtained approximately 3 hours after intravenous injection of radiopharmaceutical. RADIOPHARMACEUTICALS:  19.8 mCi Technetium-34mMDP IV COMPARISON:  Lumbar spine series 01/14/2010. Chest x-ray 10/08/2009. FINDINGS: Bilateral renal function and excretion. Focal areas of increased activity noted over the cervical spine and upper thoracic spine. Although these changes could be degenerative, MRI of the cervical and thoracic spine suggested for further evaluation given the patient's history of prostate cancer. No other focal bony abnormalities identified. IMPRESSION: Focal areas of increased activity noted over the cervical spine and upper thoracic spine. Although these changes could be degenerative, MRI of the cervical and thoracic spine suggested for further evaluation given the patient's history of prostate cancer. Electronically Signed   By: TMarcello Moores Register M.D.   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    PLAN:Today, I talked to the patient about the findings and work-up thus far.  We discussed the natural history of metastatic prostate cancer and general treatment, highlighting the role of radiotherapy in the management.  We discussed the available radiation techniques, and focused on the details of logistics and delivery.  We reviewed the anticipated acute and late sequelae associated with  radiation in this setting.  The patient was encouraged to ask questions that I answered to the best of my ability.    At this point, I don't see a clear role for radiotherapy.  The patient has started Orgovyx and will meet with Dr. SAlen Blewon 11/01/21 to discuss therapy escalation.  I personally spent 60 minutes in this encounter including chart review, reviewing radiological studies, meeting face-to-face with the patient, entering orders and completing documentation.    ------------------------------------------------   MTyler Pita MD [place] [phone] Fax: [phone]  Skype  LinkedIn

## 2021-10-16 ENCOUNTER — Other Ambulatory Visit: Payer: Self-pay

## 2021-10-16 ENCOUNTER — Ambulatory Visit
Admission: RE | Admit: 2021-10-16 | Discharge: 2021-10-16 | Disposition: A | Discharge status: Home / Self Care | Payer: Self-pay | Source: Ambulatory Visit | Attending: Radiation Oncology | Admitting: Radiation Oncology

## 2021-10-16 ENCOUNTER — Inpatient Hospital Stay: Payer: Self-pay

## 2021-10-16 VITALS — BP 132/80 | HR 77 | Temp 98.7°F | Wt 178.4 lb

## 2021-10-16 DIAGNOSIS — C419 Malignant neoplasm of bone and articular cartilage, unspecified: Secondary | ICD-10-CM

## 2021-10-16 DIAGNOSIS — F17210 Nicotine dependence, cigarettes, uncomplicated: Secondary | ICD-10-CM | POA: Insufficient documentation

## 2021-10-16 DIAGNOSIS — C779 Secondary and unspecified malignant neoplasm of lymph node, unspecified: Secondary | ICD-10-CM | POA: Insufficient documentation

## 2021-10-16 DIAGNOSIS — C7951 Secondary malignant neoplasm of bone: Secondary | ICD-10-CM | POA: Insufficient documentation

## 2021-10-16 DIAGNOSIS — C61 Malignant neoplasm of prostate: Secondary | ICD-10-CM | POA: Insufficient documentation

## 2021-10-16 DIAGNOSIS — E7800 Pure hypercholesterolemia, unspecified: Secondary | ICD-10-CM | POA: Insufficient documentation

## 2021-10-16 DIAGNOSIS — Z809 Family history of malignant neoplasm, unspecified: Secondary | ICD-10-CM | POA: Insufficient documentation

## 2021-10-16 NOTE — Progress Notes (Signed)
[place] Work  Initial Assessment   Tyler Pratt is a 66 y.o. year old male contacted by phone. Clinical Social Work was referred by nurse navigator for assessment of psychosocial needs.   SDOH (Social Determinants of Health) assessments performed: Yes SDOH Interventions    Flowsheet Row Most Recent Value  SDOH Interventions   Food Insecurity Interventions Intervention Not Indicated  Housing Interventions Intervention Not Indicated  Transportation Interventions Intervention Not Indicated       SDOH Screenings   Alcohol Screen: Not on file  Depression (PHQ2-9): Medium Risk (07/26/2021)   Depression (PHQ2-9)    PHQ-2 Score: 6  Financial Resource Strain: Not on file  Food Insecurity: No Food Insecurity (10/16/2021)   Hunger Vital Sign    Worried About Running Out of Food in the Last Year: Never true    Ran Out of Food in the Last Year: Never true  Housing: Low Risk  (10/16/2021)   Housing    Last Housing Risk Score: 0  Physical Activity: Not on file  Social Connections: Not on file  Stress: Not on file  Tobacco Use: High Risk (10/09/2021)   Patient History    Smoking Tobacco Use: Every Day    Smokeless Tobacco Use: Never    Passive Exposure: Not on file  Transportation Needs: No Transportation Needs (10/16/2021)   PRAPARE - Transportation    Lack of Transportation (Medical): No    Lack of Transportation (Non-Medical): No     Distress Screen completed: No    10/16/2021    9:57 AM  ONCBCN DISTRESS SCREENING  Screening Type Initial Screening  Distress experienced in past week (1-10) 7  Practical problem type Insurance  Emotional problem type Adjusting to illness  Spiritual/Religous concerns type Facing my mortality      Family/Social Information:  Housing Arrangement: Patient lives with his brother. Family members/support persons in your life? Family Transportation concerns: no  Employment: Working full time Patient has been an Mining engineer for seven  years. Income source: Employment Financial concerns: Yes, due to illness and/or loss of work during treatment Type of concern: Medical bills Food access concerns: no Religious or spiritual practice: No Services Currently in place:  None  Coping/ Adjustment to diagnosis: Patient understands treatment plan and what happens next? yes Concerns about diagnosis and/or treatment: Overwhelmed by information Patient reported stressors: Radio producer and/or priorities: Did not disclose. Patient stated he did not have any hobbies. Current coping skills/ strengths: Capable of independent living , Financial means , and Work skills     SUMMARY: Current SDOH Barriers:  Financial constraints related to treatment.  Clinical Social Work Clinical Goal(s):  Explore community resource options for unmet needs related to:  Financial Strain   Interventions: Discussed common feeling and emotions when being diagnosed with cancer, and the importance of support during treatment Informed patient of the support team roles and support services at [facility] Provided [place] contact information and encouraged patient to call with any questions or concerns   Follow Up Plan: CSW will see patient on Monday, 7/10 at 1pm. Patient verbalizes understanding of plan: Yes    Rodman Pickle [name], LCSW

## 2021-10-16 NOTE — Progress Notes (Signed)
Introduced myself to the patient as the prostate nurse navigator.  He is here to discuss his radiation treatment options, and will have a MedOnc Consult with Dr. Alen Blew on 7/19.  I gave him my business card and asked him to call me with questions or concerns.  Verbalized understanding.

## 2021-10-18 ENCOUNTER — Encounter: Payer: Self-pay | Admitting: Oncology

## 2021-10-18 NOTE — Progress Notes (Signed)
Received voicemail from patient regarding financial concerns.  Called patient to introduce myself as Arboriculturist and to offer available resources.  Patient states he has appointment with social worker regarding his disability which was his concern.  Patient has information to gather on his end regarding financial assistance.  Advised patient once his treatment plan has been established, I will reach out to him to advise what he is eligible to apply for and what is needed. He verbalized understanding.  He has my card for any additional financial questions or concerns.

## 2021-10-20 ENCOUNTER — Other Ambulatory Visit: Payer: Self-pay

## 2021-10-23 ENCOUNTER — Inpatient Hospital Stay: Payer: Self-pay

## 2021-10-23 ENCOUNTER — Other Ambulatory Visit: Payer: Self-pay

## 2021-10-23 NOTE — Progress Notes (Signed)
[place] CSW Progress Note  Holiday representative met with patient to assess psychosocial needs and to discuss available resources.  Initiated [employer] disability application.  CSW to fax when complete.  Confirmed patient has a PCP.  He expressed understanding of his illness and looks forward to meeting with Dr. Alen Blew on 7/19 to address a possible treatment plan.  Patient has reestablished his relationship with his family and they are supportive.  Discussed Medicaid vs. Private insurance.  Patient continues working.  Encouraged patient to contact CSW with any more questions or concerns.    Rodman Pickle Sylina Henion, LCSW

## 2021-10-25 ENCOUNTER — Other Ambulatory Visit: Payer: Self-pay

## 2021-10-25 ENCOUNTER — Ambulatory Visit: Payer: Self-pay | Attending: Nurse Practitioner | Admitting: Nurse Practitioner

## 2021-10-25 ENCOUNTER — Encounter: Payer: Self-pay | Admitting: Nurse Practitioner

## 2021-10-25 VITALS — BP 138/88 | HR 79 | Temp 98.5°F | Wt 183.6 lb

## 2021-10-25 DIAGNOSIS — D72829 Elevated white blood cell count, unspecified: Secondary | ICD-10-CM

## 2021-10-25 DIAGNOSIS — I10 Essential (primary) hypertension: Secondary | ICD-10-CM

## 2021-10-25 DIAGNOSIS — G4700 Insomnia, unspecified: Secondary | ICD-10-CM

## 2021-10-25 DIAGNOSIS — Z87898 Personal history of other specified conditions: Secondary | ICD-10-CM

## 2021-10-25 MED ORDER — LOSARTAN POTASSIUM-HCTZ 50-12.5 MG PO TABS
1.0000 | ORAL_TABLET | Freq: Every day | ORAL | 1 refills | Status: DC
  Filled 2021-10-25: qty 30, 30d supply, fill #0
  Filled 2021-11-28: qty 30, 30d supply, fill #1

## 2021-10-25 MED ORDER — TRAZODONE HCL 150 MG PO TABS
150.0000 mg | ORAL_TABLET | Freq: Every day | ORAL | 1 refills | Status: DC
  Filled 2021-10-25: qty 90, 90d supply, fill #0
  Filled 2021-12-07: qty 30, 30d supply, fill #0
  Filled 2022-01-04: qty 30, 30d supply, fill #1
  Filled 2022-02-05: qty 30, 30d supply, fill #2
  Filled 2022-03-02: qty 30, 30d supply, fill #3
  Filled 2022-03-28: qty 30, 30d supply, fill #4
  Filled 2022-04-22 – 2022-04-23 (×2): qty 30, 30d supply, fill #5

## 2021-10-25 MED ORDER — TRAZODONE HCL 150 MG PO TABS
150.0000 mg | ORAL_TABLET | Freq: Every day | ORAL | 1 refills | Status: DC
  Filled 2021-10-25: qty 30, 30d supply, fill #0

## 2021-10-25 NOTE — Progress Notes (Signed)
Assessment & Plan:  [place] was seen today for hypertension and medication refill.  Diagnoses and all orders for this visit:  Essential hypertension -     CMP14+EGFR -     losartan-hydrochlorothiazide (HYZAAR) 50-12.5 MG tablet; Take 1 tablet by mouth daily.  History of prediabetes -     Hemoglobin A1c -     CMP14+EGFR  Leukocytosis, unspecified type -     CBC with Differential  Insomnia, unspecified type -     Discontinue: traZODone (DESYREL) 150 MG tablet; Take 1 tablet (150 mg total) by mouth at bedtime. Take 1 -1.5 tablets by mouth daily at bedtime as needed. -     traZODone (DESYREL) 150 MG tablet; Take 1 tablet (150 mg total) by mouth at bedtime.    Patient has been counseled on age-appropriate routine health concerns for screening and prevention. These are reviewed and up-to-date. Referrals have been placed accordingly. Immunizations are up-to-date or declined.    Subjective:   Chief Complaint  Patient presents with   Hypertension   Medication Refill   HPI DONN ZANETTI 66 y.o. male presents to office today for follow up to HTN.   He has a past medical history of High cholesterol, prediabetes, Hypertension, and Prostate CA   He is currently being followed by Urology for prostate CA and has been referred to Oncology whom he will see next week. PER UROLOGY NOTES: 10-09-2021 CT abd/pelvis shows numerous pelvic and retroperitoneal lymph nodes. Bone scan shows thoracic spine metastasis. PSA 1500. He denies any bone pain. No significant LUTS. Started on Casodex 50 mg daily.   HTN Blood pressure not quite at goal. Will switch HCTZ 50 mg to Hyzaar 50-12.5 mg and he will continue on amlodipine 10 mg daily BP Readings from Last 3 Encounters:  10/25/21 138/88  10/16/21 132/80  10/09/21 (!) 147/71    Review of Systems  Constitutional:  Negative for fever, malaise/fatigue and weight loss.  HENT: Negative.  Negative for nosebleeds.   Eyes: Negative.  Negative for blurred  vision, double vision and photophobia.  Respiratory: Negative.  Negative for cough and shortness of breath.   Cardiovascular: Negative.  Negative for chest pain, palpitations and leg swelling.  Gastrointestinal: Negative.  Negative for heartburn, nausea and vomiting.  Musculoskeletal: Negative.  Negative for myalgias.  Neurological: Negative.  Negative for dizziness, focal weakness, seizures and headaches.  Psychiatric/Behavioral: Negative.  Negative for suicidal ideas.     Past Medical History:  Diagnosis Date   High cholesterol    Hypertension    Prostate CA [facility])     History reviewed. No pertinent surgical history.  Family History  Problem Relation Age of Onset   Hypertension Mother    Cancer Mother    Hypertension Father    Cancer Father    Diabetes Maternal Grandmother    Heart disease Paternal Grandfather     Social History Reviewed with no changes to be made today.   Outpatient Medications Prior to Visit  Medication Sig Dispense Refill   amLODipine (NORVASC) 10 MG tablet Take 1 tablet (10 mg total) by mouth daily. 90 tablet 1   ezetimibe (ZETIA) 10 MG tablet Take 1 tablet (10 mg total) by mouth daily. 90 tablet 3   hydrochlorothiazide (HYDRODIURIL) 50 MG tablet Take 1 tablet (50 mg total) by mouth daily. 90 tablet 0   traZODone (DESYREL) 100 MG tablet Take 1 -1.5 tablets by mouth daily at bedtime as needed. 145 tablet 1   No facility-administered medications  prior to visit.    No Known Allergies     Objective:    BP 138/88 (BP Location: Left Arm, Patient Position: Sitting, Cuff Size: Normal)   Pulse 79   Temp 98.5 F (36.9 C) (Oral)   Wt 183 lb 9.6 oz (83.3 kg)   SpO2 97%   BMI 26.34 kg/m  Wt Readings from Last 3 Encounters:  10/25/21 183 lb 9.6 oz (83.3 kg)  10/16/21 178 lb 6.4 oz (80.9 kg)  07/26/21 177 lb 6.4 oz (80.5 kg)    Physical Exam Vitals and nursing note reviewed.  Constitutional:      Appearance: He is well-developed.  HENT:     Head:  Normocephalic and atraumatic.  Cardiovascular:     Rate and Rhythm: Normal rate and regular rhythm.     Heart sounds: Normal heart sounds. No murmur heard.    No friction rub. No gallop.  Pulmonary:     Effort: Pulmonary effort is normal. No tachypnea or respiratory distress.     Breath sounds: Normal breath sounds. No decreased breath sounds, wheezing, rhonchi or rales.  Chest:     Chest wall: No tenderness.  Abdominal:     General: Bowel sounds are normal.     Palpations: Abdomen is soft.  Musculoskeletal:        General: Normal range of motion.     Cervical back: Normal range of motion.  Skin:    General: Skin is warm and dry.  Neurological:     Mental Status: He is alert and oriented to person, place, and time.     Coordination: Coordination normal.  Psychiatric:        Behavior: Behavior normal. Behavior is cooperative.        Thought Content: Thought content normal.        Judgment: Judgment normal.          Patient has been counseled extensively about nutrition and exercise as well as the importance of adherence with medications and regular follow-up. The patient was given clear instructions to go to ER or return to medical center if symptoms don't improve, worsen or new problems develop. The patient verbalized understanding.   Follow-up: Return for see luke 3-4 weeks BP check. see me in 3 months .   Gildardo Pounds, FNP-BC [facility] and [place], [place]   10/25/2021, 5:13 PM

## 2021-10-25 NOTE — Progress Notes (Signed)
Bp Check Diagnosed with prostate cancer Spread. He get  No fall

## 2021-10-26 LAB — CBC WITH DIFFERENTIAL/PLATELET
Basophils Absolute: 0.1 10*3/uL (ref 0.0–0.2)
Basos: 1 %
EOS (ABSOLUTE): 0.2 10*3/uL (ref 0.0–0.4)
Eos: 2 %
Hematocrit: 41.3 % (ref 37.5–51.0)
Hemoglobin: 13.7 g/dL (ref 13.0–17.7)
Immature Grans (Abs): 0 10*3/uL (ref 0.0–0.1)
Immature Granulocytes: 0 %
Lymphocytes Absolute: 2.8 10*3/uL (ref 0.7–3.1)
Lymphs: 30 %
MCH: 30 pg (ref 26.6–33.0)
MCHC: 33.2 g/dL (ref 31.5–35.7)
MCV: 91 fL (ref 79–97)
Monocytes Absolute: 0.7 10*3/uL (ref 0.1–0.9)
Monocytes: 7 %
Neutrophils Absolute: 5.8 10*3/uL (ref 1.4–7.0)
Neutrophils: 60 %
Platelets: 328 10*3/uL (ref 150–450)
RBC: 4.56 x10E6/uL (ref 4.14–5.80)
RDW: 12.5 % (ref 11.6–15.4)
WBC: 9.5 10*3/uL (ref 3.4–10.8)

## 2021-10-26 LAB — CMP14+EGFR
ALT: 19 IU/L (ref 0–44)
AST: 23 IU/L (ref 0–40)
Albumin/Globulin Ratio: 2.2 (ref 1.2–2.2)
Albumin: 4.6 g/dL (ref 3.9–4.9)
Alkaline Phosphatase: 84 IU/L (ref 44–121)
BUN/Creatinine Ratio: 15 (ref 10–24)
BUN: 17 mg/dL (ref 8–27)
Bilirubin Total: 0.3 mg/dL (ref 0.0–1.2)
CO2: 24 mmol/L (ref 20–29)
Calcium: 9.3 mg/dL (ref 8.6–10.2)
Chloride: 98 mmol/L (ref 96–106)
Creatinine, Ser: 1.16 mg/dL (ref 0.76–1.27)
Globulin, Total: 2.1 g/dL (ref 1.5–4.5)
Glucose: 93 mg/dL (ref 70–99)
Potassium: 4 mmol/L (ref 3.5–5.2)
Sodium: 139 mmol/L (ref 134–144)
Total Protein: 6.7 g/dL (ref 6.0–8.5)
eGFR: 70 mL/min/{1.73_m2} (ref >59)

## 2021-10-26 LAB — HEMOGLOBIN A1C
Est. average glucose Bld gHb Est-mCnc: 111 mg/dL
Hgb A1c MFr Bld: 5.5 % (ref 4.8–5.6)

## 2021-11-01 ENCOUNTER — Telehealth: Payer: Self-pay | Admitting: Pharmacy Technician

## 2021-11-01 ENCOUNTER — Other Ambulatory Visit: Payer: Self-pay

## 2021-11-01 ENCOUNTER — Other Ambulatory Visit (HOSPITAL_COMMUNITY): Payer: Self-pay

## 2021-11-01 ENCOUNTER — Inpatient Hospital Stay: Payer: Self-pay | Attending: Oncology | Admitting: Oncology

## 2021-11-01 VITALS — BP 135/70 | HR 89 | Temp 97.7°F | Resp 17 | Ht 70.0 in | Wt 182.5 lb

## 2021-11-01 DIAGNOSIS — F17210 Nicotine dependence, cigarettes, uncomplicated: Secondary | ICD-10-CM

## 2021-11-01 DIAGNOSIS — Z79899 Other long term (current) drug therapy: Secondary | ICD-10-CM

## 2021-11-01 DIAGNOSIS — I10 Essential (primary) hypertension: Secondary | ICD-10-CM

## 2021-11-01 DIAGNOSIS — C61 Malignant neoplasm of prostate: Secondary | ICD-10-CM

## 2021-11-01 MED ORDER — XTANDI 40 MG PO CAPS
160.0000 mg | ORAL_CAPSULE | Freq: Every day | ORAL | 1 refills | Status: DC
  Filled 2021-11-01: qty 120, 30d supply, fill #0

## 2021-11-01 MED ORDER — ORGOVYX 120 MG PO TABS: 1.0000 | ORAL_TABLET | Freq: Every day | ORAL | 11 refills | Status: DC

## 2021-11-01 NOTE — Telephone Encounter (Signed)
Oral Oncology Patient Advocate Encounter   Submitted application for assistance for Xtandi through [employer].   [employer] number [phone].   I will continue to check the status until final determination.   Lady Deutscher, CPhT-Adv Pharmacy Patient Advocate Specialist [place] Patient Advocate Team Direct Number: [phone]  Fax: [phone]

## 2021-11-01 NOTE — Progress Notes (Signed)
Reason for the request:    Prostate cancer  HPI: I was asked by Dr. Alyson Ingles to evaluate Tyler Pratt for the evaluation of prostate cancer.  He is a 66 year old with history of hypertension and hyperlipidemia was found to have an elevated PSA in May 2022.  At that time his PSA was 621 and did not have a full evaluation due to insurance coverage issues establish care with Dr. Alyson Ingles in May 2023.  At that time his PSA was 1523 and digital rectal examination showed no abnormalities in his prostate.  Prostate biopsy obtained on Sep 06, 2021 which showed a Gleason score 4+5 = 9 in all 12 cores.  Staging work-up at that time including CT scan abdomen and pelvis showed enlarged prostate with abdominal pelvic adenopathy consistent with metastatic disease.  Bone scan was also showed sclerotic lesion at T12 that is nonspecific.  There are some areas in the cervical spine as well that are suspicious.  He was evaluated by Dr. Tammi Klippel and felt radiation is not recommended at this time.  He was started on androgen deprivation therapy in the form of Orgovyx.  Clinically, he reports few urinary symptoms including urinary incontinence hesitancy and frequency.  He denies any bone pain or pathological fractures.  He denies any hospitalizations or illnesses.  He does not report any headaches, blurry vision, syncope or seizures. Does not report any fevers, chills or sweats.  Does not report any cough, wheezing or hemoptysis.  Does not report any chest pain, palpitation, orthopnea or leg edema.  Does not report any nausea, vomiting or abdominal pain.  Does not report any constipation or diarrhea.  Does not report any skeletal complaints.    Does not report frequency, urgency or hematuria.  Does not report any skin rashes or lesions. Does not report any heat or cold intolerance.  Does not report any lymphadenopathy or petechiae.  Does not report any anxiety or depression.  Remaining review of systems is negative.     Past  Medical History:  Diagnosis Date   High cholesterol    Hypertension    Prostate CA ([place])   :  No past surgical history on file.:   Current Outpatient Medications:    amLODipine (NORVASC) 10 MG tablet, Take 1 tablet (10 mg total) by mouth daily., Disp: 90 tablet, Rfl: 1   ezetimibe (ZETIA) 10 MG tablet, Take 1 tablet (10 mg total) by mouth daily., Disp: 90 tablet, Rfl: 3   losartan-hydrochlorothiazide (HYZAAR) 50-12.5 MG tablet, Take 1 tablet by mouth daily., Disp: 30 tablet, Rfl: 1   Relugolix (ORGOVYX) 120 MG TABS, Take 1 tablet by mouth daily., Disp: 30 tablet, Rfl: 11   traZODone (DESYREL) 150 MG tablet, Take 1 tablet (150 mg total) by mouth at bedtime., Disp: 90 tablet, Rfl: 1:  No Known Allergies:   Family History  Problem Relation Age of Onset   Hypertension Mother    Cancer Mother    Hypertension Father    Cancer Father    Diabetes Maternal Grandmother    Heart disease Paternal Grandfather   :   Social History   Socioeconomic History   Marital status: Single    Spouse name: Not on file   Number of children: Not on file   Years of education: Not on file   Highest education level: Not on file  Occupational History   Not on file  Tobacco Use   Smoking status: Every Day    Packs/day: 1.00    Years: 40.00  Total pack years: 40.00    Types: Cigarettes   Smokeless tobacco: Never  Vaping Use   Vaping Use: Never used  Substance and Sexual Activity   Alcohol use: Yes    Comment: weekly    Drug use: Yes    Types: Cocaine    Comment: crack   Sexual activity: Not on file  Other Topics Concern   Not on file  Social History Narrative   Not on file   Social Determinants of Health   Financial Resource Strain: Not on file  Food Insecurity: No Food Insecurity (10/16/2021)   Hunger Vital Sign    Worried About Running Out of Food in the Last Year: Never true    Ran Out of Food in the Last Year: Never true  Transportation Needs: No Transportation Needs (10/16/2021)    PRAPARE - Hydrologist (Medical): No    Lack of Transportation (Non-Medical): No  Physical Activity: Not on file  Stress: Not on file  Social Connections: Not on file  Intimate Partner Violence: Not on file  :  Pertinent items are noted in HPI.  Exam: Blood pressure 135/70, pulse 89, temperature 97.7 F (36.5 C), temperature source Temporal, resp. rate 17, height '5\' 10"'$  (1.778 m), weight 182 lb 8 oz (82.8 kg), SpO2 98 %. ECOG 0 General appearance: alert and cooperative appeared without distress. Head: atraumatic without any abnormalities. Eyes: conjunctivae/corneas clear. PERRL.  Sclera anicteric. Throat: lips, mucosa, and tongue normal; without oral thrush or ulcers. Resp: clear to auscultation bilaterally without rhonchi, wheezes or dullness to percussion. Cardio: regular rate and rhythm, S1, S2 normal, no murmur, click, rub or gallop GI: soft, non-tender; bowel sounds normal; no masses,  no organomegaly Skin: Skin color, texture, turgor normal. No rashes or lesions Lymph nodes: Cervical, supraclavicular, and axillary nodes normal. Neurologic: Grossly normal without any motor, sensory or deep tendon reflexes. Musculoskeletal: No joint deformity or effusion.    Assessment and Plan:    66 year old with:  1.  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  He has Gleason score 4+5 = 9 and a PSA of 1523.  The natural course of this disease was reviewed at this time and treatment options were discussed.  Androgen deprivation therapy remains the cornerstone of treating advanced prostate cancer.  Therapy escalation is also recommended in this setting.  These options include Taxotere chemotherapy, androgen receptor pathway inhibitors or combination of the above.  Complication associated with all these treatments were reviewed.  Complications associated with systemic  chemotherapy were discussed.  These include nausea, vomiting neutropenia and possible sepsis.  After discussion today, I recommended proceeding with Xtandi daily dosing.  Complications that include nausea, fatigue, hypertension, edema, hematuria and rarely seizures.  Alternative options such as Zytiga could be also considered if coverage issues are encountered.   After discussion he is agreeable to proceed.  2.  Androgen deprivation: I recommended continuing this indefinitely.  He is receiving that under the care of Dr. Alyson Ingles.  Alternative options were discussed with the patient today including surgical ectomy.  3.  Bone directed therapy: Recommended calcium and vitamin D supplements  4.  Hypertension: We will continue to monitor on Xtandi.  His blood pressure currently under excellent control.  5.  Follow-up: We will be in the next 2 months for repeat evaluation.  60  minutes were dedicated to this visit. The time was spent on  reviewing laboratory data, imaging studies, discussing treatment options, and answering questions regarding future plan.     A copy of this consult has been forwarded to the requesting physician.

## 2021-11-02 ENCOUNTER — Telehealth: Payer: Self-pay

## 2021-11-02 ENCOUNTER — Other Ambulatory Visit (HOSPITAL_COMMUNITY): Payer: Self-pay

## 2021-11-02 DIAGNOSIS — C61 Malignant neoplasm of prostate: Secondary | ICD-10-CM

## 2021-11-02 LAB — PSA: Prostate Specific Ag, Serum: 53.9 ng/mL — ABNORMAL HIGH (ref 0.0–4.0)

## 2021-11-02 LAB — TESTOSTERONE: Testosterone: 5 ng/dL — ABNORMAL LOW (ref 264–916)

## 2021-11-02 MED ORDER — ENZALUTAMIDE 40 MG PO TABS: 160.0000 mg | ORAL_TABLET | Freq: Every day | ORAL | 1 refills | Status: DC

## 2021-11-02 MED ORDER — ENZALUTAMIDE 40 MG PO TABS
160.0000 mg | ORAL_TABLET | Freq: Every day | ORAL | 1 refills | Status: DC
  Filled 2021-11-02: qty 120, 30d supply, fill #0

## 2021-11-02 NOTE — Telephone Encounter (Signed)
Oral Oncology Patient Advocate Encounter   Submitted Asset Screening Questionaire to [employer] via online portal.    [employer] number [phone].   I will continue to check the status until final determination.   Lady Deutscher, CPhT-Adv Pharmacy Patient Advocate Specialist [place] Patient Advocate Team Direct Number: [phone]  Fax: [phone]

## 2021-11-02 NOTE — Telephone Encounter (Signed)
Oral Oncology Pharmacist Encounter  Received new prescription for enzalutamide [name]) for the treatment of advanced, castration sensitive prostate cancer, planned duration until disease progression or unacceptable toxicity. Per MD, okay to switch capsules to tablets.  Labs from 10/25/21 assessed, no interventions needed. Prescription dose and frequency assessed.   Current medication list in Epic reviewed, DDIs with Xtandi identified: none  Evaluated chart and no patient barriers to medication adherence noted.   Patient agreement for treatment documented in MD note on 11/01/21.  Prescription has been e-scribed to the [facility] for benefits analysis and approval.  Oral Oncology Clinic will continue to follow for insurance authorization, copayment issues, initial counseling and start date.  Drema Halon, PharmD Hematology/Oncology Clinical Pharmacist [facility] [phone] 11/02/2021 10:16 AM

## 2021-11-02 NOTE — Progress Notes (Signed)
RN followed up with patient after recent MedOnc consult on 7/19.   Patient aware that staff is working on obtaining authorization for medication, Xtandi.    Denies any needs at this time, RN encouraged patient to reach out with any questions or concerns.  Pt agreeable.

## 2021-11-02 NOTE — Telephone Encounter (Signed)
Oral Oncology Patient Advocate Encounter   Received notification that the application for assistance for Xtandi through [employer] has been approved.   [employer] number [phone].   Effective dates: 11/02/2021 through 04/15/2022  I have spoken to the patient.   Lady Deutscher, CPhT-Adv Pharmacy Patient Advocate Specialist [place] Patient Advocate Team Direct Number: [phone]  Fax: [phone]

## 2021-11-03 ENCOUNTER — Telehealth: Payer: Self-pay | Admitting: Oncology

## 2021-11-03 NOTE — Telephone Encounter (Signed)
Scheduled per 07/19 los, patient has been called and notified. 

## 2021-11-08 ENCOUNTER — Encounter: Payer: Self-pay | Admitting: Urology

## 2021-11-08 ENCOUNTER — Ambulatory Visit (INDEPENDENT_AMBULATORY_CARE_PROVIDER_SITE_OTHER): Payer: Self-pay | Admitting: Urology

## 2021-11-08 VITALS — BP 117/70 | HR 78

## 2021-11-08 DIAGNOSIS — C61 Malignant neoplasm of prostate: Secondary | ICD-10-CM

## 2021-11-08 LAB — URINALYSIS, ROUTINE W REFLEX MICROSCOPIC
Bilirubin, UA: NEGATIVE
Glucose, UA: NEGATIVE
Ketones, UA: NEGATIVE
Leukocytes,UA: NEGATIVE
Nitrite, UA: NEGATIVE
Protein,UA: NEGATIVE
RBC, UA: NEGATIVE
Specific Gravity, UA: 1.01 (ref 1.005–1.030)
Urobilinogen, Ur: 0.2 mg/dL (ref 0.2–1.0)
pH, UA: 5.5 (ref 5.0–7.5)

## 2021-11-08 NOTE — Patient Instructions (Signed)

## 2021-11-08 NOTE — Progress Notes (Signed)
11/08/2021 11:46 AM   [name] 04-27-1955 [id number]  Referring provider: No referring provider defined for this encounter.  Followup prostate cancer   HPI: Tyler Pratt is a 66yo here for followup for metastatic prostate cancer. He is on orgovyx. He started xtandi last night after meeting with Dr. Alen Blew. He IPSS 17 QOl 3 after starting ADT. Mild hot flashes   PMH: Past Medical History:  Diagnosis Date   High cholesterol    Hypertension    Prostate CA [facility])     Surgical History: No past surgical history on file.  Home Medications:  Allergies as of 11/08/2021   No Known Allergies      Medication List        Accurate as of November 08, 2021 11:46 AM. If you have any questions, ask your nurse or doctor.          amLODipine 10 MG tablet Commonly known as: NORVASC Take 1 tablet (10 mg total) by mouth daily.   enzalutamide 40 MG tablet Commonly known as: XTANDI Take 4 tablets (160 mg total) by mouth daily.   ezetimibe 10 MG tablet Commonly known as: Zetia Take 1 tablet (10 mg total) by mouth daily.   losartan-hydrochlorothiazide 50-12.5 MG tablet Commonly known as: HYZAAR Take 1 tablet by mouth daily.   Orgovyx 120 MG Tabs Generic drug: Relugolix Take 1 tablet by mouth daily.   traZODone 150 MG tablet Commonly known as: DESYREL Take 1 tablet (150 mg total) by mouth at bedtime.        Allergies: No Known Allergies  Family History: Family History  Problem Relation Age of Onset   Hypertension Mother    Cancer Mother    Hypertension Father    Cancer Father    Diabetes Maternal Grandmother    Heart disease Paternal Grandfather     Social History:  reports that he has been smoking cigarettes. He has a 40.00 pack-year smoking history. He has never used smokeless tobacco. He reports current alcohol use. He reports current drug use. Drug: Cocaine.  ROS: All other review of systems were reviewed and are negative except what is noted above in  HPI  Physical Exam: BP 117/70   Pulse 78   Constitutional:  Alert and oriented, No acute distress. HEENT: [place] AT, moist mucus membranes.  Trachea midline, no masses. Cardiovascular: No clubbing, cyanosis, or edema. Respiratory: Normal respiratory effort, no increased work of breathing. GI: Abdomen is soft, nontender, nondistended, no abdominal masses GU: No CVA tenderness.  Lymph: No cervical or inguinal lymphadenopathy. Skin: No rashes, bruises or suspicious lesions. Neurologic: Grossly intact, no focal deficits, moving all 4 extremities. Psychiatric: Normal mood and affect.  Laboratory Data: Lab Results  Component Value Date   WBC 9.5 10/25/2021   HGB 13.7 10/25/2021   HCT 41.3 10/25/2021   MCV 91 10/25/2021   PLT 328 10/25/2021    Lab Results  Component Value Date   CREATININE 1.16 10/25/2021    No results found for: "PSA"  Lab Results  Component Value Date   TESTOSTERONE 5 (L) 11/01/2021    Lab Results  Component Value Date   HGBA1C 5.5 10/25/2021    Urinalysis    Component Value Date/Time   COLORURINE AMBER (A) 10/25/2013 1211   APPEARANCEUR Clear 08/25/2021 1111   LABSPEC 1.034 (H) 10/25/2013 1211   PHURINE 5.5 10/25/2013 1211   GLUCOSEU Negative 08/25/2021 1111   HGBUR NEGATIVE 10/25/2013 1211   BILIRUBINUR Negative 08/25/2021 1111   KETONESUR  NEGATIVE 10/25/2013 1211   PROTEINUR Negative 08/25/2021 1111   PROTEINUR NEGATIVE 10/25/2013 1211   UROBILINOGEN 1.0 10/25/2013 1211   NITRITE Negative 08/25/2021 1111   NITRITE NEGATIVE 10/25/2013 1211   LEUKOCYTESUR Negative 08/25/2021 1111    Lab Results  Component Value Date   LABMICR Comment 08/25/2021   BACTERIA FEW (A) 10/25/2013    Pertinent Imaging:  No results found for this or any previous visit.  No results found for this or any previous visit.  No results found for this or any previous visit.  No results found for this or any previous visit.  No results found for this or any  previous visit.  No results found for this or any previous visit.  No results found for this or any previous visit.  No results found for this or any previous visit.   Assessment & Plan:    1. Prostate cancer ([place]) Continue orgovyx '120mg'$  daily -RTC 3 months with PSA - Urinalysis, Routine w reflex microscopic   No follow-ups on file.  Nicolette Bang, MD  [facility] [place]

## 2021-11-08 NOTE — Telephone Encounter (Signed)
Oral Chemotherapy Pharmacist Encounter  I spoke with patient for overview of: Xtandi for the treatment of advanced, castration-sensitive prostate cancer in conjunction with ***, planned duration until disease progression or unacceptable toxicity.   Counseled patient on administration, dosing, side effects, monitoring, drug-food interactions, safe handling, storage, and disposal.  Patient will take Xtandi '40mg'$  capsules, 4 capsules ('160mg'$ ) by mouth once daily without regard to food.  Xtandi start date: ***  Adverse effects include but are not limited to: peripheral edema, GI upset, hypertension, hot flashes, fatigue, falls/fractures, and arthralgias.   Patient instructed about small risk of seizures with Xtandi treatment.  Reviewed with patient importance of keeping a medication schedule and plan for any missed doses. No barriers to medication adherence identified.  Medication reconciliation performed and medication/allergy list updated.  Insurance authorization for [name] has been obtained. Patient is receiving medication through patient assistance.  Patient informed the pharmacy will reach out 5-7 days prior to needing next fill of Xtandi to coordinate continued medication acquisition to prevent break in therapy.  All questions answered.  Mr. Driggers voiced understanding and appreciation.   Medication education handout placed in mail for patient. Patient knows to call the office with questions or concerns. Oral Chemotherapy Clinic phone number provided to patient.   Drema Halon, PharmD Hematology/Oncology Clinical Pharmacist [facility] [phone] 11/08/2021   11:27 AM

## 2021-11-13 NOTE — Telephone Encounter (Signed)
Oral Chemotherapy Pharmacist Encounter  I spoke with patient for overview of: Xtandi for the treatment of prostate cancer, castration-resistant prostate cancer, planned duration until disease progression or unacceptable toxicity.   Counseled patient on administration, dosing, side effects, monitoring, drug-food interactions, safe handling, storage, and disposal.  Patient will take Xtandi '40mg'$  tablets, 4 tablets ('160mg'$ ) by mouth once daily without regard to food.  Xtandi start date: 11/10/2021  Adverse effects include but are not limited to: peripheral edema, GI upset, hypertension, hot flashes, fatigue, falls/fractures, and arthralgias.   Patient instructed about small risk of seizures with Xtandi treatment.  Reviewed with patient importance of keeping a medication schedule and plan for any missed doses. No barriers to medication adherence identified.  Medication reconciliation performed and medication/allergy list updated.  Insurance authorization for [name] has been obtained. Patient receives medication through [employer].   Patient informed the pharmacy will reach out 5-7 days prior to needing next fill of Xtandi to coordinate continued medication acquisition to prevent break in therapy.  All questions answered.  Tyler Pratt voiced understanding and appreciation.   Medication education handout placed in mail for patient. Patient knows to call the office with questions or concerns. Oral Chemotherapy Clinic phone number provided to patient.   Drema Halon, PharmD Hematology/Oncology Clinical Pharmacist [facility] [phone] 11/13/2021   9:38 AM

## 2021-11-16 NOTE — Progress Notes (Signed)
RN spoke with patient to confirm he started medication, Xtandi.  Pt did start on 7/28.  Denies any unusual side effects, and has no barriers to identify at this time.  Encouraged patient to contact me if new concerns come up.  Verbalized understanding and agreement.

## 2021-11-22 ENCOUNTER — Other Ambulatory Visit: Payer: Self-pay

## 2021-11-24 ENCOUNTER — Other Ambulatory Visit: Payer: Self-pay

## 2021-11-28 ENCOUNTER — Other Ambulatory Visit: Payer: Self-pay

## 2021-11-30 ENCOUNTER — Ambulatory Visit: Payer: Self-pay | Admitting: Pharmacist

## 2021-12-07 ENCOUNTER — Other Ambulatory Visit: Payer: Self-pay

## 2021-12-21 ENCOUNTER — Other Ambulatory Visit: Payer: Self-pay

## 2021-12-22 ENCOUNTER — Other Ambulatory Visit: Payer: Self-pay | Admitting: Oncology

## 2021-12-22 DIAGNOSIS — C61 Malignant neoplasm of prostate: Secondary | ICD-10-CM

## 2021-12-28 ENCOUNTER — Inpatient Hospital Stay: Payer: Self-pay | Attending: Oncology

## 2021-12-28 ENCOUNTER — Other Ambulatory Visit: Payer: Self-pay

## 2021-12-28 ENCOUNTER — Inpatient Hospital Stay (HOSPITAL_BASED_OUTPATIENT_CLINIC_OR_DEPARTMENT_OTHER): Payer: Self-pay | Admitting: Oncology

## 2021-12-28 ENCOUNTER — Other Ambulatory Visit: Payer: Self-pay | Admitting: Nurse Practitioner

## 2021-12-28 VITALS — BP 135/68 | HR 81 | Temp 97.5°F | Resp 14 | Ht 70.0 in | Wt 185.2 lb

## 2021-12-28 DIAGNOSIS — C61 Malignant neoplasm of prostate: Secondary | ICD-10-CM | POA: Insufficient documentation

## 2021-12-28 DIAGNOSIS — Z79899 Other long term (current) drug therapy: Secondary | ICD-10-CM | POA: Insufficient documentation

## 2021-12-28 DIAGNOSIS — I10 Essential (primary) hypertension: Secondary | ICD-10-CM | POA: Insufficient documentation

## 2021-12-28 DIAGNOSIS — C779 Secondary and unspecified malignant neoplasm of lymph node, unspecified: Secondary | ICD-10-CM | POA: Insufficient documentation

## 2021-12-28 LAB — CMP (CANCER CENTER ONLY)
ALT: 13 U/L (ref 0–44)
AST: 15 U/L (ref 15–41)
Albumin: 4.2 g/dL (ref 3.5–5.0)
Alkaline Phosphatase: 66 U/L (ref 38–126)
Anion gap: 7 (ref 5–15)
BUN: 16 mg/dL (ref 8–23)
CO2: 29 mmol/L (ref 22–32)
Calcium: 9.4 mg/dL (ref 8.9–10.3)
Chloride: 102 mmol/L (ref 98–111)
Creatinine: 1.13 mg/dL (ref 0.61–1.24)
GFR, Estimated: >60 mL/min (ref >60)
Glucose, Bld: 126 mg/dL — ABNORMAL HIGH (ref 70–99)
Potassium: 3.8 mmol/L (ref 3.5–5.1)
Sodium: 138 mmol/L (ref 135–145)
Total Bilirubin: 0.3 mg/dL (ref 0.3–1.2)
Total Protein: 6.6 g/dL (ref 6.5–8.1)

## 2021-12-28 LAB — CBC WITH DIFFERENTIAL (CANCER CENTER ONLY)
Abs Immature Granulocytes: 0.04 10*3/uL (ref 0.00–0.07)
Basophils Absolute: 0 10*3/uL (ref 0.0–0.1)
Basophils Relative: 1 %
Eosinophils Absolute: 0.2 10*3/uL (ref 0.0–0.5)
Eosinophils Relative: 2 %
HCT: 35.7 % — ABNORMAL LOW (ref 39.0–52.0)
Hemoglobin: 12.2 g/dL — ABNORMAL LOW (ref 13.0–17.0)
Immature Granulocytes: 1 %
Lymphocytes Relative: 31 %
Lymphs Abs: 2.5 10*3/uL (ref 0.7–4.0)
MCH: 31 pg (ref 26.0–34.0)
MCHC: 34.2 g/dL (ref 30.0–36.0)
MCV: 90.8 fL (ref 80.0–100.0)
Monocytes Absolute: 0.6 10*3/uL (ref 0.1–1.0)
Monocytes Relative: 7 %
Neutro Abs: 4.8 10*3/uL (ref 1.7–7.7)
Neutrophils Relative %: 58 %
Platelet Count: 324 10*3/uL (ref 150–400)
RBC: 3.93 MIL/uL — ABNORMAL LOW (ref 4.22–5.81)
RDW: 12.7 % (ref 11.5–15.5)
WBC Count: 8.1 10*3/uL (ref 4.0–10.5)
nRBC: 0 % (ref 0.0–0.2)

## 2021-12-28 MED ORDER — LOSARTAN POTASSIUM-HCTZ 50-12.5 MG PO TABS
1.0000 | ORAL_TABLET | Freq: Every day | ORAL | 1 refills | Status: DC
  Filled 2021-12-28: qty 30, 30d supply, fill #0
  Filled 2022-01-31: qty 30, 30d supply, fill #1

## 2021-12-28 NOTE — Progress Notes (Signed)
Hematology and Oncology Follow Up Visit  Tyler Pratt [id number] 1955/06/15 66 y.o. 12/28/2021 3:11 PM Tyler Pratt, NPFleming, Tyler Buff, NP   Principle Diagnosis: 66 year old with castration-sensitive advanced prostate cancer with disease to lymph nodes diagnosed in May 2023.  He was found to have Gleason score of 9 and PSA 1523.   Prior Therapy:  Prostate biopsy obtained on Sep 06, 2021 which showed a Gleason score 4+5 = 9 in all 12 cores.  Staging work-up at that time including CT scan abdomen and pelvis showed enlarged prostate with abdominal pelvic adenopathy consistent with metastatic disease.   Current therapy:   Orgovyx 120 mg daily started in June 2023.  Xtandi 160 mg daily started on November 10, 2021.  Interim History: [name] returns today for a follow-up.  Since last visit, he was started on Xtandi without any major complications.  He denies any nausea, vomiting or abdominal pain.  He denies any hospitalizations or illnesses.  His performance status quality of life remains unchanged.  He has reported fatigue and hot flashes but no other complaints.    Medications: I have reviewed the patient's current medications.  Current Outpatient Medications  Medication Sig Dispense Refill   amLODipine (NORVASC) 10 MG tablet Take 1 tablet (10 mg total) by mouth daily. 90 tablet 1   ezetimibe (ZETIA) 10 MG tablet Take 1 tablet (10 mg total) by mouth daily. 90 tablet 3   losartan-hydrochlorothiazide (HYZAAR) 50-12.5 MG tablet Take 1 tablet by mouth daily. 30 tablet 1   Relugolix (ORGOVYX) 120 MG TABS Take 1 tablet by mouth daily. 30 tablet 11   traZODone (DESYREL) 150 MG tablet Take 1 tablet (150 mg total) by mouth at bedtime. 90 tablet 1   XTANDI 40 MG tablet Take 4 tablets ('160mg'$ ) by mouth once daily as directed by physician. 120 tablet 0   No current facility-administered medications for this visit.     Allergies: No Known Allergies    Physical Exam: Blood pressure  135/68, pulse 81, temperature (!) 97.5 F (36.4 C), temperature source Temporal, resp. rate 14, height '5\' 10"'$  (1.778 m), weight 185 lb 3.2 oz (84 kg), SpO2 97 %.  ECOG:    General appearance: Comfortable appearing without any discomfort Head: Normocephalic without any trauma Oropharynx: Mucous membranes are moist and pink without any thrush or ulcers. Eyes: Pupils are equal and round reactive to light. Lymph nodes: No cervical, supraclavicular, inguinal or axillary lymphadenopathy.   Heart:regular rate and rhythm.  S1 and S2 without leg edema. Lung: Clear without any rhonchi or wheezes.  No dullness to percussion. Abdomin: Soft, nontender, nondistended with good bowel sounds.  No hepatosplenomegaly. Musculoskeletal: No joint deformity or effusion.  Full range of motion noted. Neurological: No deficits noted on motor, sensory and deep tendon reflex exam. Skin: No petechial rash or dryness.  Appeared moist.     Lab Results: Lab Results  Component Value Date   WBC 9.5 10/25/2021   HGB 13.7 10/25/2021   HCT 41.3 10/25/2021   MCV 91 10/25/2021   PLT 328 10/25/2021     Chemistry      Component Value Date/Time   NA 139 10/25/2021 1641   K 4.0 10/25/2021 1641   CL 98 10/25/2021 1641   CO2 24 10/25/2021 1641   BUN 17 10/25/2021 1641   CREATININE 1.16 10/25/2021 1641   GLU 82 04/02/2013 0000      Component Value Date/Time   CALCIUM 9.3 10/25/2021 1641   ALKPHOS 84 10/25/2021  1641   AST 23 10/25/2021 1641   ALT 19 10/25/2021 1641   BILITOT 0.3 10/25/2021 1641         Impression and Plan:  66 year old with:  1.  Advanced prostate cancer with lymphadenopathy diagnosed in May 2023.  He has castration-sensitive after presenting with Gleason score 4+5 = 9 and a PSA of 1523.   He is currently on Xtandi without any major complications.  Risks and benefits of continuing this treatment were discussed at this time.  Complications including hypertension, fatigue, urinary symptoms.   Alternative treatment options including chemotherapy or a different androgen receptor pathway inhibitor.  At this time he is agreeable to continue.  His PSA has already dropped significantly from 1500 down to 50.   2.  Androgen deprivation: He is currently on daily Orgovyx managed by Dr. Alyson Pratt.   3.  Bone directed therapy: No evidence of metastatic bone disease documented at this time.  I recommended calcium and vitamin D supplements.   4.  Hypertension: His blood pressure is within normal range and continue to monitor on next MD.   5.  Follow-up: In 3 months for a follow-up.   30  minutes were spent on this encounter.  The time was dedicated to reviewing his disease status, treatment complications, future plan of care review and alternative treatment options.     Tyler Button, MD 9/14/20233:11 PM

## 2021-12-29 ENCOUNTER — Telehealth: Payer: Self-pay | Admitting: Oncology

## 2021-12-29 ENCOUNTER — Telehealth: Payer: Self-pay | Admitting: *Deleted

## 2021-12-29 ENCOUNTER — Other Ambulatory Visit: Payer: Self-pay

## 2021-12-29 LAB — PROSTATE-SPECIFIC AG, SERUM (LABCORP): Prostate Specific Ag, Serum: 0.2 ng/mL (ref 0.0–4.0)

## 2021-12-29 NOTE — Telephone Encounter (Signed)
Per 9/14 los called and spoke to pt about appointment

## 2021-12-29 NOTE — Telephone Encounter (Signed)
PC to patient, informed him his PSA is 0.2, he verbalizes understanding.

## 2021-12-29 NOTE — Telephone Encounter (Signed)
-----   Message from Wyatt Portela, MD sent at 12/29/2021  8:56 AM EDT ----- Please let him know his PSA is down

## 2022-01-04 ENCOUNTER — Other Ambulatory Visit: Payer: Self-pay

## 2022-01-04 ENCOUNTER — Ambulatory Visit: Payer: Self-pay | Attending: Nurse Practitioner | Admitting: Pharmacist

## 2022-01-04 VITALS — BP 120/71 | HR 76

## 2022-01-04 DIAGNOSIS — I10 Essential (primary) hypertension: Secondary | ICD-10-CM

## 2022-01-04 NOTE — Progress Notes (Signed)
S:     No chief complaint on file.  Tyler Pratt is a 66 y.o. male who presents for hypertension evaluation, education, and management.  PMH is significant for HTN, prediabetes, prostate cancer.  Patient was referred and last seen by Primary Care Provider, Zelda, on 10/25/2021.   At last visit, BP was 138/88, HCTZ was switched to Losartan-HCTZ 50/12.5 mg daily.   Today, patient arrives in good spirits and presents without assistance. Denies dizziness, headache, blurred vision, swelling.   Family/Social history: -Fhx: HTN, Heart disease. -Tobacco: currently smoker  Medication adherence is appropriate. Patient has taken BP medications today.   Current antihypertensives include: Amlodipine 10 mg daily, Losartan-HCTZ 50/12.5 mg daily   Reported home BP readings: [facility] but doesn't remember readings.   Patient reported dietary habits:  -Patient reports increasing fruits and veggies intake and limiting salt.  Patient-reported exercise habits:  -Patient doesn't have a current exercising regimen.  O:  Vitals:   01/04/22 1543  BP: 120/71  Pulse: 76   Last 3 Office BP readings: BP Readings from Last 3 Encounters:  01/04/22 120/71  12/28/21 135/68  11/08/21 117/70   BMET    Component Value Date/Time   NA 138 12/28/2021 1513   NA 139 10/25/2021 1641   K 3.8 12/28/2021 1513   CL 102 12/28/2021 1513   CO2 29 12/28/2021 1513   GLUCOSE 126 (H) 12/28/2021 1513   BUN 16 12/28/2021 1513   BUN 17 10/25/2021 1641   CREATININE 1.13 12/28/2021 1513   CALCIUM 9.4 12/28/2021 1513   GFRNONAA >60 12/28/2021 1513   GFRAA  02/16/2007 1209    >60        The eGFR has been calculated using the MDRD equation. This calculation has not been validated in all clinical   Renal function: Estimated Creatinine Clearance: 67.3 mL/min (by C-G formula based on SCr of 1.13 mg/dL).  Clinical ASCVD: No  The 10-year ASCVD risk score (Arnett DK, et al., 2019) is:  23.9%   Values used to calculate the score:     Age: 14 years     Sex: Male     Is Non-Hispanic African American: Yes     Diabetic: No     Tobacco smoker: Yes     Systolic Blood Pressure: 458 mmHg     Is BP treated: Yes     HDL Cholesterol: 56 mg/dL     Total Cholesterol: 237 mg/dL  A/P: Hypertension longstanding currently controlled on current medications. BP goal < 130/80 mmHg. Medication adherence appears appropriate.  -Continued Amlodipine 10 mg daily. -Continued Losartan-HCTZ 50/12.5 mg daily.   -F/u labs ordered - none -Counseled on lifestyle modifications for blood pressure control including reduced dietary sodium, increased exercise, adequate sleep. -Encouraged patient to check BP at home and bring log of readings to next visit. Counseled on proper use of home BP cuff.    Results reviewed and written information provided.    Written patient instructions provided. Patient verbalized understanding of treatment plan.  Total time in face to face counseling 30 minutes.    Follow-up:  -Follow up with PCP in October.  Patient seen with: Deirdre Evener, PharmD Candidate [employer]  Class of 2025    Pharmacist:  Benard Halsted, PharmD, [place], [place] [phone]

## 2022-01-25 ENCOUNTER — Other Ambulatory Visit: Payer: Self-pay

## 2022-01-26 ENCOUNTER — Ambulatory Visit: Payer: Self-pay | Admitting: Nurse Practitioner

## 2022-01-30 ENCOUNTER — Other Ambulatory Visit: Payer: Medicare Other

## 2022-01-30 DIAGNOSIS — C61 Malignant neoplasm of prostate: Secondary | ICD-10-CM

## 2022-01-31 ENCOUNTER — Other Ambulatory Visit: Payer: Self-pay

## 2022-01-31 ENCOUNTER — Other Ambulatory Visit: Payer: Self-pay | Admitting: Oncology

## 2022-01-31 DIAGNOSIS — C61 Malignant neoplasm of prostate: Secondary | ICD-10-CM

## 2022-01-31 LAB — PSA: Prostate Specific Ag, Serum: <0.1 ng/mL (ref 0.0–4.0)

## 2022-02-05 ENCOUNTER — Other Ambulatory Visit: Payer: Self-pay

## 2022-02-06 ENCOUNTER — Encounter: Payer: Self-pay | Admitting: Urology

## 2022-02-06 ENCOUNTER — Ambulatory Visit (INDEPENDENT_AMBULATORY_CARE_PROVIDER_SITE_OTHER): Payer: Medicare Other | Admitting: Urology

## 2022-02-06 VITALS — BP 141/79 | HR 78

## 2022-02-06 DIAGNOSIS — C61 Malignant neoplasm of prostate: Secondary | ICD-10-CM

## 2022-02-06 DIAGNOSIS — C775 Secondary and unspecified malignant neoplasm of intrapelvic lymph nodes: Secondary | ICD-10-CM

## 2022-02-06 LAB — URINALYSIS, ROUTINE W REFLEX MICROSCOPIC
Bilirubin, UA: NEGATIVE
Glucose, UA: NEGATIVE
Ketones, UA: NEGATIVE
Leukocytes,UA: NEGATIVE
Nitrite, UA: NEGATIVE
Protein,UA: NEGATIVE
RBC, UA: NEGATIVE
Specific Gravity, UA: 1.01 (ref 1.005–1.030)
Urobilinogen, Ur: 0.2 mg/dL (ref 0.2–1.0)
pH, UA: 7 (ref 5.0–7.5)

## 2022-02-06 MED ORDER — ORGOVYX 120 MG PO TABS: 1.0000 | ORAL_TABLET | Freq: Every day | ORAL | 11 refills | Status: DC

## 2022-02-06 NOTE — Progress Notes (Signed)
02/06/2022 11:30 AM   [name] [birth year] [id number]  Referring provider: Gildardo Pounds, NP [place],  [place] [address]  Followup metastatic prostate cancer   HPI: Mr Tyler Pratt is a 66yo here for followup for metastatic prostate cancer. He remains on orgovyx and xtandi. PSA undetectable. No bone pain. No significant LUTS. He has mild hot flashes. He has intermittent issues with sleep.    PMH: Past Medical History:  Diagnosis Date   High cholesterol    Hypertension    Prostate CA [facility])     Surgical History: No past surgical history on file.  Home Medications:  Allergies as of 02/06/2022   No Known Allergies      Medication List        Accurate as of February 06, 2022 11:30 AM. If you have any questions, ask your nurse or doctor.          amLODipine 10 MG tablet Commonly known as: NORVASC Take 1 tablet (10 mg total) by mouth daily.   ezetimibe 10 MG tablet Commonly known as: Zetia Take 1 tablet (10 mg total) by mouth daily.   losartan-hydrochlorothiazide 50-12.5 MG tablet Commonly known as: HYZAAR Take 1 tablet by mouth daily.   Orgovyx 120 MG Tabs Generic drug: Relugolix Take 1 tablet by mouth daily.   traZODone 150 MG tablet Commonly known as: DESYREL Take 1 tablet (150 mg total) by mouth at bedtime.   Xtandi 40 MG tablet Generic drug: enzalutamide Take 4 tablets ('160mg'$ ) by mouth once daily as directed by physician.        Allergies: No Known Allergies  Family History: Family History  Problem Relation Age of Onset   Hypertension Mother    Cancer Mother    Hypertension Father    Cancer Father    Diabetes Maternal Grandmother    Heart disease Paternal Grandfather     Social History:  reports that he has been smoking cigarettes. He has a 40.00 pack-year smoking history. He has never used smokeless tobacco. He reports current alcohol use. He reports current drug use. Drug: Cocaine.  ROS: All other  review of systems were reviewed and are negative except what is noted above in HPI  Physical Exam: BP (!) 141/79   Pulse 78   Constitutional:  Alert and oriented, No acute distress. HEENT: [place] AT, moist mucus membranes.  Trachea midline, no masses. Cardiovascular: No clubbing, cyanosis, or edema. Respiratory: Normal respiratory effort, no increased work of breathing. GI: Abdomen is soft, nontender, nondistended, no abdominal masses GU: No CVA tenderness.  Lymph: No cervical or inguinal lymphadenopathy. Skin: No rashes, bruises or suspicious lesions. Neurologic: Grossly intact, no focal deficits, moving all 4 extremities. Psychiatric: Normal mood and affect.  Laboratory Data: Lab Results  Component Value Date   WBC 8.1 12/28/2021   HGB 12.2 (L) 12/28/2021   HCT 35.7 (L) 12/28/2021   MCV 90.8 12/28/2021   PLT 324 12/28/2021    Lab Results  Component Value Date   CREATININE 1.13 12/28/2021    No results found for: "PSA"  Lab Results  Component Value Date   TESTOSTERONE 5 (L) 11/01/2021    Lab Results  Component Value Date   HGBA1C 5.5 10/25/2021    Urinalysis    Component Value Date/Time   COLORURINE AMBER (A) 10/25/2013 1211   APPEARANCEUR Clear 11/08/2021 1133   LABSPEC 1.034 (H) 10/25/2013 1211   PHURINE 5.5 10/25/2013 1211   GLUCOSEU Negative 11/08/2021 1133  HGBUR NEGATIVE 10/25/2013 1211   BILIRUBINUR Negative 11/08/2021 1133   KETONESUR NEGATIVE 10/25/2013 1211   PROTEINUR Negative 11/08/2021 1133   PROTEINUR NEGATIVE 10/25/2013 1211   UROBILINOGEN 1.0 10/25/2013 1211   NITRITE Negative 11/08/2021 1133   NITRITE NEGATIVE 10/25/2013 1211   LEUKOCYTESUR Negative 11/08/2021 1133    Lab Results  Component Value Date   LABMICR Comment 11/08/2021   BACTERIA FEW (A) 10/25/2013    Pertinent Imaging:  No results found for this or any previous visit.  No results found for this or any previous visit.  No results found for this or any previous  visit.  No results found for this or any previous visit.  No results found for this or any previous visit.  No valid procedures specified. No results found for this or any previous visit.  No results found for this or any previous visit.   Assessment & Plan:    1. Prostate cancer metastatic to intrapelvic lymph node ([place]) -Continue orgovyx and xtandi. -RTC 3 months with PSA   No follow-ups on file.  Nicolette Bang, MD  [facility] [place]

## 2022-02-06 NOTE — Patient Instructions (Signed)

## 2022-02-06 NOTE — Addendum Note (Signed)
Addended by: Iris Pert on: 02/06/2022 12:26 PM   Modules accepted: Orders

## 2022-02-16 ENCOUNTER — Other Ambulatory Visit: Payer: Self-pay | Admitting: Oncology

## 2022-02-16 DIAGNOSIS — C61 Malignant neoplasm of prostate: Secondary | ICD-10-CM

## 2022-02-26 ENCOUNTER — Other Ambulatory Visit: Payer: Self-pay

## 2022-02-26 ENCOUNTER — Other Ambulatory Visit: Payer: Self-pay | Admitting: Nurse Practitioner

## 2022-02-26 ENCOUNTER — Other Ambulatory Visit: Payer: Self-pay | Admitting: Physician Assistant

## 2022-02-26 DIAGNOSIS — E7841 Elevated Lipoprotein(a): Secondary | ICD-10-CM

## 2022-02-26 DIAGNOSIS — I10 Essential (primary) hypertension: Secondary | ICD-10-CM

## 2022-02-26 MED ORDER — AMLODIPINE BESYLATE 10 MG PO TABS
10.0000 mg | ORAL_TABLET | Freq: Every day | ORAL | 0 refills | Status: DC
  Filled 2022-02-26: qty 90, 90d supply, fill #0

## 2022-02-26 MED ORDER — EZETIMIBE 10 MG PO TABS
10.0000 mg | ORAL_TABLET | Freq: Every day | ORAL | 3 refills | Status: DC
  Filled 2022-02-26: qty 90, 90d supply, fill #0
  Filled 2022-06-03: qty 90, 90d supply, fill #1
  Filled 2022-09-12 – 2022-09-20 (×2): qty 90, 90d supply, fill #2
  Filled 2023-01-02 (×2): qty 90, 90d supply, fill #3

## 2022-02-27 ENCOUNTER — Other Ambulatory Visit: Payer: Self-pay

## 2022-03-02 ENCOUNTER — Other Ambulatory Visit: Payer: Self-pay

## 2022-03-02 ENCOUNTER — Other Ambulatory Visit: Payer: Self-pay | Admitting: Family Medicine

## 2022-03-02 DIAGNOSIS — I10 Essential (primary) hypertension: Secondary | ICD-10-CM

## 2022-03-02 MED ORDER — LOSARTAN POTASSIUM-HCTZ 50-12.5 MG PO TABS
1.0000 | ORAL_TABLET | Freq: Every day | ORAL | 0 refills | Status: DC
  Filled 2022-03-02: qty 90, 90d supply, fill #0

## 2022-03-02 NOTE — Telephone Encounter (Signed)
Requested Prescriptions  Pending Prescriptions Disp Refills   losartan-hydrochlorothiazide (HYZAAR) 50-12.5 MG tablet 90 tablet 0    Sig: Take 1 tablet by mouth daily.     Cardiovascular: ARB + Diuretic Combos Failed - 03/02/2022  5:51 AM      Failed - Last BP in normal range    BP Readings from Last 1 Encounters:  02/06/22 (!) 141/79         Passed - K in normal range and within 180 days    Potassium  Date Value Ref Range Status  12/28/2021 3.8 3.5 - 5.1 mmol/L Final         Passed - Na in normal range and within 180 days    Sodium  Date Value Ref Range Status  12/28/2021 138 135 - 145 mmol/L Final  10/25/2021 139 134 - 144 mmol/L Final         Passed - Cr in normal range and within 180 days    Creatinine  Date Value Ref Range Status  12/28/2021 1.13 0.61 - 1.24 mg/dL Final         Passed - eGFR is 10 or above and within 180 days    GFR calc Af Amer  Date Value Ref Range Status  02/16/2007   Final   >60        The eGFR has been calculated using the MDRD equation. This calculation has not been validated in all clinical   GFR, Estimated  Date Value Ref Range Status  12/28/2021 >60 >60 mL/min Final    Comment:    (NOTE) Calculated using the CKD-EPI Creatinine Equation (2021)    eGFR  Date Value Ref Range Status  10/25/2021 70 >59 mL/min/1.73 Final         Passed - Patient is not pregnant      Passed - Valid encounter within last 6 months    Recent Outpatient Visits           1 month ago Essential hypertension   [place], RPH-CPP   4 months ago Essential hypertension   [place], Vernia Buff, NP   7 months ago Essential hypertension   [place], Vernia Buff, NP   1 year ago Essential hypertension   [place], [place]   1 year ago Primary hypertension   [place], Vernia Buff, NP       Future Appointments             In 3 days Gildardo Pounds, NP [place]   In 2 months McKenzie, Candee Furbish, MD [facility] [place]

## 2022-03-05 ENCOUNTER — Ambulatory Visit: Payer: Self-pay | Admitting: Nurse Practitioner

## 2022-03-28 ENCOUNTER — Other Ambulatory Visit: Payer: Self-pay

## 2022-03-29 ENCOUNTER — Other Ambulatory Visit: Payer: Self-pay

## 2022-04-03 ENCOUNTER — Other Ambulatory Visit: Payer: Self-pay | Admitting: Oncology

## 2022-04-03 DIAGNOSIS — C61 Malignant neoplasm of prostate: Secondary | ICD-10-CM

## 2022-04-04 ENCOUNTER — Other Ambulatory Visit: Payer: Self-pay

## 2022-04-04 ENCOUNTER — Inpatient Hospital Stay (HOSPITAL_BASED_OUTPATIENT_CLINIC_OR_DEPARTMENT_OTHER): Payer: Medicare Other | Admitting: Oncology

## 2022-04-04 ENCOUNTER — Inpatient Hospital Stay: Payer: Medicare Other | Attending: Oncology

## 2022-04-04 VITALS — BP 143/74 | HR 95 | Temp 97.7°F | Resp 15 | Wt 188.1 lb

## 2022-04-04 DIAGNOSIS — I10 Essential (primary) hypertension: Secondary | ICD-10-CM | POA: Insufficient documentation

## 2022-04-04 DIAGNOSIS — C61 Malignant neoplasm of prostate: Secondary | ICD-10-CM | POA: Insufficient documentation

## 2022-04-04 DIAGNOSIS — Z79899 Other long term (current) drug therapy: Secondary | ICD-10-CM | POA: Insufficient documentation

## 2022-04-04 DIAGNOSIS — C775 Secondary and unspecified malignant neoplasm of intrapelvic lymph nodes: Secondary | ICD-10-CM | POA: Insufficient documentation

## 2022-04-04 LAB — CBC WITH DIFFERENTIAL (CANCER CENTER ONLY)
Abs Immature Granulocytes: 0.03 10*3/uL (ref 0.00–0.07)
Basophils Absolute: 0.1 10*3/uL (ref 0.0–0.1)
Basophils Relative: 1 %
Eosinophils Absolute: 0.2 10*3/uL (ref 0.0–0.5)
Eosinophils Relative: 2 %
HCT: 35.3 % — ABNORMAL LOW (ref 39.0–52.0)
Hemoglobin: 12 g/dL — ABNORMAL LOW (ref 13.0–17.0)
Immature Granulocytes: 0 %
Lymphocytes Relative: 28 %
Lymphs Abs: 2.7 10*3/uL (ref 0.7–4.0)
MCH: 30.9 pg (ref 26.0–34.0)
MCHC: 34 g/dL (ref 30.0–36.0)
MCV: 91 fL (ref 80.0–100.0)
Monocytes Absolute: 0.8 10*3/uL (ref 0.1–1.0)
Monocytes Relative: 8 %
Neutro Abs: 6 10*3/uL (ref 1.7–7.7)
Neutrophils Relative %: 61 %
Platelet Count: 363 10*3/uL (ref 150–400)
RBC: 3.88 MIL/uL — ABNORMAL LOW (ref 4.22–5.81)
RDW: 12.1 % (ref 11.5–15.5)
WBC Count: 9.7 10*3/uL (ref 4.0–10.5)
nRBC: 0 % (ref 0.0–0.2)

## 2022-04-04 NOTE — Progress Notes (Signed)
Hematology and Oncology Follow Up Visit  Tyler Pratt [id number] 06/04/1955 66 y.o. 04/04/2022 2:52 PM Tyler Pratt, NPFleming, Tyler Buff, NP   Principle Diagnosis: 66 year old man with advanced prostate cancer with lymphadenopathy diagnosed in May 2023.  He was found to have castration-sensitive, Gleason score of 9 and PSA 1523.   Prior Therapy:  Prostate biopsy obtained on Sep 06, 2021 which showed a Gleason score 4+5 = 9 in all 12 cores.  Staging work-up at that time including CT scan abdomen and pelvis showed enlarged prostate with abdominal pelvic adenopathy consistent with metastatic disease.   Current therapy:   Orgovyx 120 mg daily started in June 2023.  Xtandi 160 mg daily started on November 10, 2021.  Interim History: Tyler Pratt presents today for a repeat evaluation.  Since the last visit, he reports no major changes in his health.  He continues to tolerate Orgovyx and Xtandi without any issues.  He denies any nausea, vomiting or abdominal pain.  He has reported some hot flashes and weight gain other complaints.  He denies any bone pain or pathological fractures.  He denies excessive fatigue or tiredness.    Medications: Updated on review. Current Outpatient Medications  Medication Sig Dispense Refill   amLODipine (NORVASC) 10 MG tablet Take 1 tablet (10 mg total) by mouth daily. 90 tablet 0   ezetimibe (ZETIA) 10 MG tablet Take 1 tablet (10 mg total) by mouth daily. 90 tablet 3   losartan-hydrochlorothiazide (HYZAAR) 50-12.5 MG tablet Take 1 tablet by mouth daily. 90 tablet 0   Relugolix (ORGOVYX) 120 MG TABS Take 1 tablet by mouth daily. 30 tablet 11   traZODone (DESYREL) 150 MG tablet Take 1 tablet (150 mg total) by mouth at bedtime. 90 tablet 1   XTANDI 40 MG tablet Take 4 tablets ('160mg'$ ) by mouth once daily as directed by physician. 120 tablet 0   No current facility-administered medications for this visit.     Allergies: No Known Allergies    Physical  Exam:  Blood pressure (!) 143/74, pulse 95, temperature 97.7 F (36.5 C), temperature source Temporal, resp. rate 15, weight 188 lb 1.6 oz (85.3 kg), SpO2 99 %.  ECOG:    General appearance: Alert, awake without any distress. Head: Atraumatic without abnormalities Oropharynx: Without any thrush or ulcers. Eyes: No scleral icterus. Lymph nodes: No lymphadenopathy noted in the cervical, supraclavicular, or axillary nodes Heart:regular rate and rhythm, without any murmurs or gallops.   Lung: Clear to auscultation without any rhonchi, wheezes or dullness to percussion. Abdomin: Soft, nontender without any shifting dullness or ascites. Musculoskeletal: No clubbing or cyanosis. Neurological: No motor or sensory deficits. Skin: No rashes or lesions.     Lab Results: Lab Results  Component Value Date   WBC 8.1 12/28/2021   HGB 12.2 (L) 12/28/2021   HCT 35.7 (L) 12/28/2021   MCV 90.8 12/28/2021   PLT 324 12/28/2021     Chemistry      Component Value Date/Time   NA 138 12/28/2021 1513   NA 139 10/25/2021 1641   K 3.8 12/28/2021 1513   CL 102 12/28/2021 1513   CO2 29 12/28/2021 1513   BUN 16 12/28/2021 1513   BUN 17 10/25/2021 1641   CREATININE 1.13 12/28/2021 1513   GLU 82 04/02/2013 0000      Component Value Date/Time   CALCIUM 9.4 12/28/2021 1513   ALKPHOS 66 12/28/2021 1513   AST 15 12/28/2021 1513   ALT 13 12/28/2021 1513  BILITOT 0.3 12/28/2021 1513         Latest Reference Range & Units 07/26/21 16:45 11/01/21 12:07 12/28/21 15:13 01/30/22 10:53  Prostate Specific Ag, Serum 0.0 - 4.0 ng/mL 1,523.0 (H) 53.9 (H) 0.2 <0.1  (H): Data is abnormally high Impression and Plan:  66 year old with:  1.  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     The natural course of his disease was reviewed at this time and treatment options were discussed.  His PSA is currently undetectable after excellent response to treatment.   Risks and benefits of continuing this treatment were discussed.  Complications including fatigue, hypertension among others were reiterated.  Other options such as Taxotere chemotherapy, PARP inhibitor versus other agents will be deferred for the time being unless he is metastatic disease  After discussion today he is agreeable to continue with the same treatment.   2.  Androgen deprivation: I recommended continuing this indefinitely.  He is currently on Orgovyx under the care of Dr. Alyson Ingles.   3.  Bone directed therapy: Recommended calcium and vitamin D supplements for osteoporosis prevention.   4.  Hypertension: Will continue to monitor on Xtandi.  His blood pressure is within normal range.   5.  Follow-up: In 3 months for a follow-up.   30  minutes were s gated to this visit.  The time was spent on updating disease status, treatment choices and outlining future plan of care discussion.     Zola Button, MD 12/20/20232:52 PM

## 2022-04-05 LAB — CMP (CANCER CENTER ONLY)
ALT: 14 U/L (ref 0–44)
AST: 16 U/L (ref 15–41)
Albumin: 3.4 g/dL — ABNORMAL LOW (ref 3.5–5.0)
Alkaline Phosphatase: 71 U/L (ref 38–126)
Anion gap: 8 (ref 5–15)
BUN: 15 mg/dL (ref 8–23)
CO2: 27 mmol/L (ref 22–32)
Calcium: 9.1 mg/dL (ref 8.9–10.3)
Chloride: 104 mmol/L (ref 98–111)
Creatinine: 1.13 mg/dL (ref 0.61–1.24)
GFR, Estimated: >60 mL/min (ref >60)
Glucose, Bld: 132 mg/dL — ABNORMAL HIGH (ref 70–99)
Potassium: 4.1 mmol/L (ref 3.5–5.1)
Sodium: 139 mmol/L (ref 135–145)
Total Bilirubin: 0.4 mg/dL (ref 0.3–1.2)
Total Protein: 6.6 g/dL (ref 6.5–8.1)

## 2022-04-06 ENCOUNTER — Telehealth: Payer: Self-pay | Admitting: *Deleted

## 2022-04-06 LAB — PROSTATE-SPECIFIC AG, SERUM (LABCORP): Prostate Specific Ag, Serum: <0.1 ng/mL (ref 0.0–4.0)

## 2022-04-06 NOTE — Telephone Encounter (Signed)
-----   Message from Wyatt Portela, MD sent at 04/06/2022  8:41 AM EST ----- Please let him know his PSA is still down

## 2022-04-06 NOTE — Telephone Encounter (Signed)
PC to patient, informed him of PSA results, he verbalizes understanding. 

## 2022-04-23 ENCOUNTER — Other Ambulatory Visit: Payer: Self-pay

## 2022-04-25 ENCOUNTER — Encounter: Payer: Self-pay | Admitting: Nurse Practitioner

## 2022-04-25 ENCOUNTER — Ambulatory Visit: Payer: Medicare Other | Attending: Nurse Practitioner | Admitting: Nurse Practitioner

## 2022-04-25 VITALS — BP 135/74 | HR 93 | Ht 70.0 in | Wt 191.0 lb

## 2022-04-25 DIAGNOSIS — R232 Flushing: Secondary | ICD-10-CM | POA: Diagnosis not present

## 2022-04-25 DIAGNOSIS — C775 Secondary and unspecified malignant neoplasm of intrapelvic lymph nodes: Secondary | ICD-10-CM

## 2022-04-25 DIAGNOSIS — Z79899 Other long term (current) drug therapy: Secondary | ICD-10-CM | POA: Insufficient documentation

## 2022-04-25 DIAGNOSIS — R7303 Prediabetes: Secondary | ICD-10-CM | POA: Insufficient documentation

## 2022-04-25 DIAGNOSIS — C61 Malignant neoplasm of prostate: Secondary | ICD-10-CM

## 2022-04-25 DIAGNOSIS — E7841 Elevated Lipoprotein(a): Secondary | ICD-10-CM | POA: Diagnosis not present

## 2022-04-25 DIAGNOSIS — I10 Essential (primary) hypertension: Secondary | ICD-10-CM | POA: Insufficient documentation

## 2022-04-25 DIAGNOSIS — Z87891 Personal history of nicotine dependence: Secondary | ICD-10-CM | POA: Diagnosis not present

## 2022-04-25 NOTE — Progress Notes (Signed)
Assessment & Plan:  [name] was seen today for hypertension.  Diagnoses and all orders for this visit:  Primary hypertension Continue all antihypertensives as prescribed.  Reminded to bring in blood pressure log for follow  up appointment.  RECOMMENDATIONS: DASH/Mediterranean Diets are healthier choices for HTN.     Prediabetes -     Hemoglobin A1c Continue blood sugar control as discussed in office today, low carbohydrate diet, and regular physical exercise as tolerated, 150 minutes per week (30 min each day, 5 days per week, or 50 min 3 days per week).   Elevated lipoprotein(a) -     Lipid panel  Personal history of nicotine dependence -     CT CHEST LUNG CA SCREEN LOW DOSE W/O CM; Future  Prostate cancer metastatic to intrapelvic lymph node ([place]) Follow up with UROLOGY   Patient has been counseled on age-appropriate routine health concerns for screening and prevention. These are reviewed and up-to-date. Referrals have been placed accordingly. Immunizations are up-to-date or declined.    Subjective:   Chief Complaint  Patient presents with   Hypertension   Hypertension Pertinent negatives include no blurred vision, chest pain, headaches, malaise/fatigue, palpitations or shortness of breath.   Tyler Pratt 67 y.o. male presents to office today for follow up to HTN   PMH: prostate Ca (followed by Oncology), HTN, prediabetes, leukcytosis,   PER ONCOLOGY: 03-2022 Interim History: [name] presents today for a repeat evaluation.  Since the last visit, he reports no major changes in his health.  He continues to tolerate Orgovyx and Xtandi without any issues.  He denies any nausea, vomiting or abdominal pain.  He has reported some hot flashes and weight gain other complaints.  He denies any bone pain or pathological fractures.  He denies excessive fatigue or tiredness.   HTN Blood pressure is well controlled today. He is taking amlodipine 10 mg daily and hyzaar 50-12.5  mg daily.  BP Readings from Last 3 Encounters:  04/25/22 135/74  04/04/22 (!) 143/74  02/06/22 (!) 141/79    Prediabetes Well controlled.  Lab Results  Component Value Date   HGBA1C 5.5 10/25/2021    Review of Systems  Constitutional:  Negative for fever, malaise/fatigue and weight loss.  HENT: Negative.  Negative for nosebleeds.   Eyes: Negative.  Negative for blurred vision, double vision and photophobia.  Respiratory: Negative.  Negative for cough and shortness of breath.   Cardiovascular: Negative.  Negative for chest pain, palpitations and leg swelling.  Gastrointestinal: Negative.  Negative for heartburn, nausea and vomiting.  Musculoskeletal: Negative.  Negative for myalgias.  Neurological: Negative.  Negative for dizziness, focal weakness, seizures and headaches.  Psychiatric/Behavioral: Negative.  Negative for suicidal ideas.     Past Medical History:  Diagnosis Date   High cholesterol    Hypertension    Prostate CA [facility])     History reviewed. No pertinent surgical history.  Family History  Problem Relation Age of Onset   Hypertension Mother    Cancer Mother    Hypertension Father    Cancer Father    Diabetes Maternal Grandmother    Heart disease Paternal Grandfather     Social History Reviewed with no changes to be made today.   Outpatient Medications Prior to Visit  Medication Sig Dispense Refill   amLODipine (NORVASC) 10 MG tablet Take 1 tablet (10 mg total) by mouth daily. 90 tablet 0   ezetimibe (ZETIA) 10 MG tablet Take 1 tablet (10 mg total) by mouth  daily. 90 tablet 3   losartan-hydrochlorothiazide (HYZAAR) 50-12.5 MG tablet Take 1 tablet by mouth daily. 90 tablet 0   Relugolix (ORGOVYX) 120 MG TABS Take 1 tablet by mouth daily. 30 tablet 11   traZODone (DESYREL) 150 MG tablet Take 1 tablet (150 mg total) by mouth at bedtime. 90 tablet 1   XTANDI 40 MG tablet Take 4 tablets ('160mg'$ ) by mouth once daily as directed by physician. 120 tablet 0   No  facility-administered medications prior to visit.    No Known Allergies     Objective:    BP 135/74   Pulse 93   Ht '5\' 10"'$  (1.778 m)   Wt 191 lb (86.6 kg)   SpO2 98%   BMI 27.41 kg/m  Wt Readings from Last 3 Encounters:  04/25/22 191 lb (86.6 kg)  04/04/22 188 lb 1.6 oz (85.3 kg)  12/28/21 185 lb 3.2 oz (84 kg)    Physical Exam Vitals and nursing note reviewed.  Constitutional:      Appearance: He is well-developed.  HENT:     Head: Normocephalic and atraumatic.  Cardiovascular:     Rate and Rhythm: Normal rate and regular rhythm.     Heart sounds: Normal heart sounds. No murmur heard.    No friction rub. No gallop.  Pulmonary:     Effort: Pulmonary effort is normal. No tachypnea or respiratory distress.     Breath sounds: Normal breath sounds. No decreased breath sounds, wheezing, rhonchi or rales.  Chest:     Chest wall: No tenderness.  Abdominal:     General: Bowel sounds are normal.     Palpations: Abdomen is soft.  Musculoskeletal:        General: Normal range of motion.     Cervical back: Normal range of motion.  Skin:    General: Skin is warm and dry.  Neurological:     Mental Status: He is alert and oriented to person, place, and time.     Coordination: Coordination normal.  Psychiatric:        Behavior: Behavior normal. Behavior is cooperative.        Thought Content: Thought content normal.        Judgment: Judgment normal.          Patient has been counseled extensively about nutrition and exercise as well as the importance of adherence with medications and regular follow-up. The patient was given clear instructions to go to ER or return to medical center if symptoms don't improve, worsen or new problems develop. The patient verbalized understanding.   Follow-up: Return in about 3 months (around 07/25/2022).   Gildardo Pounds, FNP-BC [facility], [place]   04/25/2022, 3:12 PM

## 2022-04-25 NOTE — Progress Notes (Signed)
No concerns. 

## 2022-04-26 ENCOUNTER — Other Ambulatory Visit: Payer: Self-pay | Admitting: Nurse Practitioner

## 2022-04-26 DIAGNOSIS — E782 Mixed hyperlipidemia: Secondary | ICD-10-CM

## 2022-04-26 LAB — LIPID PANEL
Chol/HDL Ratio: 5.5 ratio — ABNORMAL HIGH (ref 0.0–5.0)
Cholesterol, Total: 280 mg/dL — ABNORMAL HIGH (ref 100–199)
HDL: 51 mg/dL (ref >39)
LDL Chol Calc (NIH): 157 mg/dL — ABNORMAL HIGH (ref 0–99)
Triglycerides: 382 mg/dL — ABNORMAL HIGH (ref 0–149)
VLDL Cholesterol Cal: 72 mg/dL — ABNORMAL HIGH (ref 5–40)

## 2022-04-26 LAB — HEMOGLOBIN A1C
Est. average glucose Bld gHb Est-mCnc: 114 mg/dL
Hgb A1c MFr Bld: 5.6 % (ref 4.8–5.6)

## 2022-04-26 MED ORDER — ROSUVASTATIN CALCIUM 20 MG PO TABS
20.0000 mg | ORAL_TABLET | Freq: Every day | ORAL | 3 refills | Status: DC
  Filled 2022-04-26: qty 90, 90d supply, fill #0
  Filled 2022-08-06: qty 90, 90d supply, fill #1
  Filled 2022-11-19 – 2023-01-02 (×3): qty 90, 90d supply, fill #2

## 2022-04-27 ENCOUNTER — Other Ambulatory Visit: Payer: Self-pay

## 2022-05-02 ENCOUNTER — Other Ambulatory Visit: Payer: Medicare Other

## 2022-05-02 ENCOUNTER — Other Ambulatory Visit: Payer: Self-pay

## 2022-05-02 DIAGNOSIS — C61 Malignant neoplasm of prostate: Secondary | ICD-10-CM

## 2022-05-03 LAB — PSA: Prostate Specific Ag, Serum: <0.1 ng/mL (ref 0.0–4.0)

## 2022-05-07 ENCOUNTER — Other Ambulatory Visit: Payer: Self-pay

## 2022-05-07 ENCOUNTER — Other Ambulatory Visit: Payer: Self-pay | Admitting: Hematology

## 2022-05-07 DIAGNOSIS — C61 Malignant neoplasm of prostate: Secondary | ICD-10-CM

## 2022-05-07 MED ORDER — ENZALUTAMIDE 40 MG PO TABS: ORAL_TABLET | ORAL | 0 refills | Status: DC

## 2022-05-08 ENCOUNTER — Other Ambulatory Visit: Payer: Self-pay

## 2022-05-08 NOTE — Progress Notes (Signed)
Per Secure Chat message from Dr. Burr Medico, Lady Deutscher PharmD, and this RN, sent scheduling message to reschedule pt's appts to 3 wks from 05/08/2022.  Pt is currently scheduled to see Dr. Burr Medico in March 2024.  Pt was a pt of Dr. Alen Blew who is now under Dr. Ernestina Penna care.

## 2022-05-09 ENCOUNTER — Other Ambulatory Visit: Payer: Self-pay

## 2022-05-09 ENCOUNTER — Ambulatory Visit (INDEPENDENT_AMBULATORY_CARE_PROVIDER_SITE_OTHER): Payer: Medicare Other | Admitting: Urology

## 2022-05-09 VITALS — BP 107/67 | HR 98

## 2022-05-09 DIAGNOSIS — C775 Secondary and unspecified malignant neoplasm of intrapelvic lymph nodes: Secondary | ICD-10-CM | POA: Diagnosis not present

## 2022-05-09 DIAGNOSIS — R351 Nocturia: Secondary | ICD-10-CM | POA: Diagnosis not present

## 2022-05-09 DIAGNOSIS — N401 Enlarged prostate with lower urinary tract symptoms: Secondary | ICD-10-CM

## 2022-05-09 DIAGNOSIS — C61 Malignant neoplasm of prostate: Secondary | ICD-10-CM | POA: Diagnosis not present

## 2022-05-09 DIAGNOSIS — N138 Other obstructive and reflux uropathy: Secondary | ICD-10-CM

## 2022-05-09 MED ORDER — ORGOVYX 120 MG PO TABS: 1.0000 | ORAL_TABLET | Freq: Every day | ORAL | 11 refills | Status: DC

## 2022-05-09 NOTE — Progress Notes (Signed)
05/09/2022 3:11 PM   [name] 08/21/1955 [id number]  Referring provider: Gildardo Pounds, NP [place],  [place] [address]  Followup metastatic prostate cancer   HPI: Tyler Pratt is a 67yo here for followup for metastatic prostate cancer. He remains on orgovyx and xtandi. PSA undetectable. IPSS 26 QOL 3 on no BPH therapy. He is not interested in therapy. Urine stream weak but it is improving. He has nocturia 2-4x. No straining to urinate.    PMH: Past Medical History:  Diagnosis Date   High cholesterol    Hypertension    Prostate CA [facility])     Surgical History: No past surgical history on file.  Home Medications:  Allergies as of 05/09/2022   No Known Allergies      Medication List        Accurate as of May 09, 2022  3:11 PM. If you have any questions, ask your nurse or doctor.          amLODipine 10 MG tablet Commonly known as: NORVASC Take 1 tablet (10 mg total) by mouth daily.   enzalutamide 40 MG tablet Commonly known as: Xtandi Take 4 tablets ('160mg'$ ) by mouth once daily as directed by physician.   ezetimibe 10 MG tablet Commonly known as: Zetia Take 1 tablet (10 mg total) by mouth daily.   losartan-hydrochlorothiazide 50-12.5 MG tablet Commonly known as: HYZAAR Take 1 tablet by mouth daily.   Orgovyx 120 MG Tabs Generic drug: Relugolix Take 1 tablet by mouth daily.   rosuvastatin 20 MG tablet Commonly known as: Crestor Take 1 tablet (20 mg total) by mouth daily.   traZODone 150 MG tablet Commonly known as: DESYREL Take 1 tablet (150 mg total) by mouth at bedtime.        Allergies: No Known Allergies  Family History: Family History  Problem Relation Age of Onset   Hypertension Mother    Cancer Mother    Hypertension Father    Cancer Father    Diabetes Maternal Grandmother    Heart disease Paternal Grandfather     Social History:  reports that he has been smoking cigarettes. He has a 40.00  pack-year smoking history. He has never used smokeless tobacco. He reports current alcohol use. He reports current drug use. Drug: Cocaine.  ROS: All other review of systems were reviewed and are negative except what is noted above in HPI  Physical Exam: BP 107/67   Pulse 98   Constitutional:  Alert and oriented, No acute distress. HEENT: [place] AT, moist mucus membranes.  Trachea midline, no masses. Cardiovascular: No clubbing, cyanosis, or edema. Respiratory: Normal respiratory effort, no increased work of breathing. GI: Abdomen is soft, nontender, nondistended, no abdominal masses GU: No CVA tenderness.  Lymph: No cervical or inguinal lymphadenopathy. Skin: No rashes, bruises or suspicious lesions. Neurologic: Grossly intact, no focal deficits, moving all 4 extremities. Psychiatric: Normal mood and affect.  Laboratory Data: Lab Results  Component Value Date   WBC 9.7 04/04/2022   HGB 12.0 (L) 04/04/2022   HCT 35.3 (L) 04/04/2022   MCV 91.0 04/04/2022   PLT 363 04/04/2022    Lab Results  Component Value Date   CREATININE 1.13 04/04/2022    No results found for: "PSA"  Lab Results  Component Value Date   TESTOSTERONE 5 (L) 11/01/2021    Lab Results  Component Value Date   HGBA1C 5.6 04/25/2022    Urinalysis    Component Value Date/Time  COLORURINE AMBER (A) 10/25/2013 1211   APPEARANCEUR Clear 02/06/2022 1251   LABSPEC 1.034 (H) 10/25/2013 1211   PHURINE 5.5 10/25/2013 1211   GLUCOSEU Negative 02/06/2022 1251   HGBUR NEGATIVE 10/25/2013 1211   BILIRUBINUR Negative 02/06/2022 1251   KETONESUR NEGATIVE 10/25/2013 1211   PROTEINUR Negative 02/06/2022 1251   PROTEINUR NEGATIVE 10/25/2013 1211   UROBILINOGEN 1.0 10/25/2013 1211   NITRITE Negative 02/06/2022 1251   NITRITE NEGATIVE 10/25/2013 1211   LEUKOCYTESUR Negative 02/06/2022 1251    Lab Results  Component Value Date   LABMICR Comment 11/08/2021   BACTERIA FEW (A) 10/25/2013    Pertinent  Imaging:  No results found for this or any previous visit.  No results found for this or any previous visit.  No results found for this or any previous visit.  No results found for this or any previous visit.  No results found for this or any previous visit.  No valid procedures specified. No results found for this or any previous visit.  No results found for this or any previous visit.   Assessment & Plan:    1. Prostate cancer metastatic to intrapelvic lymph node ([place]) Continue orgovyx and xtandi   No follow-ups on file.  Nicolette Bang, MD  [facility] [place]

## 2022-05-14 ENCOUNTER — Telehealth: Payer: Self-pay

## 2022-05-14 NOTE — Telephone Encounter (Signed)
Pt LVM stating that he still have not gotten his Xtandi (Enzalutamide) which he gets free from the manufacturer assistance program.  Pt would like to speak with someone regarding his refill.  Notified Dr. Burr Medico and [facility] Pharmacists of pt's call.

## 2022-05-15 ENCOUNTER — Encounter: Payer: Self-pay | Admitting: Urology

## 2022-05-15 NOTE — Patient Instructions (Signed)

## 2022-05-17 ENCOUNTER — Other Ambulatory Visit: Payer: Self-pay | Admitting: Nurse Practitioner

## 2022-05-17 ENCOUNTER — Other Ambulatory Visit: Payer: Self-pay

## 2022-05-17 DIAGNOSIS — G4700 Insomnia, unspecified: Secondary | ICD-10-CM

## 2022-05-17 MED ORDER — TRAZODONE HCL 150 MG PO TABS
150.0000 mg | ORAL_TABLET | Freq: Every day | ORAL | 1 refills | Status: DC
  Filled 2022-05-17: qty 90, 90d supply, fill #0
  Filled 2022-07-20: qty 90, 90d supply, fill #1

## 2022-05-18 ENCOUNTER — Other Ambulatory Visit: Payer: Self-pay

## 2022-05-27 NOTE — Assessment & Plan Note (Addendum)
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  -staging CT showed enlarged abdominal adenopathy, consistent with metastatic disease. -He is currently on Orgovyx 120 mg daily and Xtandi 160 mg daily started in June 2023 -He has had excellent response to treatment, his PSA level has been undetectable since October 2023

## 2022-05-28 ENCOUNTER — Other Ambulatory Visit: Payer: Self-pay

## 2022-05-28 ENCOUNTER — Encounter: Payer: Self-pay | Admitting: Hematology

## 2022-05-28 ENCOUNTER — Inpatient Hospital Stay (HOSPITAL_BASED_OUTPATIENT_CLINIC_OR_DEPARTMENT_OTHER): Payer: Medicare Other | Admitting: Hematology

## 2022-05-28 ENCOUNTER — Inpatient Hospital Stay: Payer: Medicare Other | Attending: Oncology

## 2022-05-28 VITALS — BP 125/73 | HR 84 | Resp 15 | Ht 70.0 in | Wt 189.8 lb

## 2022-05-28 DIAGNOSIS — M545 Low back pain, unspecified: Secondary | ICD-10-CM | POA: Insufficient documentation

## 2022-05-28 DIAGNOSIS — C61 Malignant neoplasm of prostate: Secondary | ICD-10-CM | POA: Insufficient documentation

## 2022-05-28 DIAGNOSIS — Z79899 Other long term (current) drug therapy: Secondary | ICD-10-CM | POA: Insufficient documentation

## 2022-05-28 DIAGNOSIS — C775 Secondary and unspecified malignant neoplasm of intrapelvic lymph nodes: Secondary | ICD-10-CM | POA: Insufficient documentation

## 2022-05-28 DIAGNOSIS — R232 Flushing: Secondary | ICD-10-CM | POA: Diagnosis not present

## 2022-05-28 DIAGNOSIS — I10 Essential (primary) hypertension: Secondary | ICD-10-CM | POA: Insufficient documentation

## 2022-05-28 DIAGNOSIS — G8929 Other chronic pain: Secondary | ICD-10-CM | POA: Diagnosis not present

## 2022-05-28 LAB — CBC WITH DIFFERENTIAL (CANCER CENTER ONLY)
Abs Immature Granulocytes: 0.03 10*3/uL (ref 0.00–0.07)
Basophils Absolute: 0.1 10*3/uL (ref 0.0–0.1)
Basophils Relative: 1 %
Eosinophils Absolute: 0.2 10*3/uL (ref 0.0–0.5)
Eosinophils Relative: 2 %
HCT: 35.5 % — ABNORMAL LOW (ref 39.0–52.0)
Hemoglobin: 12.1 g/dL — ABNORMAL LOW (ref 13.0–17.0)
Immature Granulocytes: 0 %
Lymphocytes Relative: 27 %
Lymphs Abs: 2.5 10*3/uL (ref 0.7–4.0)
MCH: 30.9 pg (ref 26.0–34.0)
MCHC: 34.1 g/dL (ref 30.0–36.0)
MCV: 90.8 fL (ref 80.0–100.0)
Monocytes Absolute: 0.7 10*3/uL (ref 0.1–1.0)
Monocytes Relative: 7 %
Neutro Abs: 6 10*3/uL (ref 1.7–7.7)
Neutrophils Relative %: 63 %
Platelet Count: 328 10*3/uL (ref 150–400)
RBC: 3.91 MIL/uL — ABNORMAL LOW (ref 4.22–5.81)
RDW: 12 % (ref 11.5–15.5)
WBC Count: 9.5 10*3/uL (ref 4.0–10.5)
nRBC: 0 % (ref 0.0–0.2)

## 2022-05-28 LAB — CMP (CANCER CENTER ONLY)
ALT: 14 U/L (ref 0–44)
AST: 13 U/L — ABNORMAL LOW (ref 15–41)
Albumin: 4.3 g/dL (ref 3.5–5.0)
Alkaline Phosphatase: 76 U/L (ref 38–126)
Anion gap: 6 (ref 5–15)
BUN: 15 mg/dL (ref 8–23)
CO2: 29 mmol/L (ref 22–32)
Calcium: 9.5 mg/dL (ref 8.9–10.3)
Chloride: 104 mmol/L (ref 98–111)
Creatinine: 0.98 mg/dL (ref 0.61–1.24)
GFR, Estimated: >60 mL/min (ref >60)
Glucose, Bld: 118 mg/dL — ABNORMAL HIGH (ref 70–99)
Potassium: 4 mmol/L (ref 3.5–5.1)
Sodium: 139 mmol/L (ref 135–145)
Total Bilirubin: 0.4 mg/dL (ref 0.3–1.2)
Total Protein: 6.9 g/dL (ref 6.5–8.1)

## 2022-05-28 MED ORDER — ENZALUTAMIDE 40 MG PO TABS: ORAL_TABLET | ORAL | 2 refills | Status: DC

## 2022-05-28 MED ORDER — GABAPENTIN 100 MG PO CAPS
100.0000 mg | ORAL_CAPSULE | Freq: Every day | ORAL | 0 refills | Status: DC
  Filled 2022-05-28: qty 60, 20d supply, fill #0

## 2022-05-28 NOTE — Progress Notes (Signed)
[place]   Telephone:(336) [phone] Fax:(336) [phone]   Clinic Follow up Note   Patient Care Team: Gildardo Pounds, NP as PCP - General (Nurse Practitioner) Katheren Puller, RN as Oncology Nurse Navigator Duffy, Rodman Pickle, LCSW as Social Worker (Licensed Clinical Social Worker) Truitt Merle, MD as Attending Physician (Hematology and Oncology)  Date of Service:  05/28/2022  CHIEF COMPLAINT: f/u of  Prostate Cancer  CURRENT THERAPY:   Orgovyx 120 mg daily started in June 2023.   Xtandi 160 mg daily started on November 10, 2021.  ASSESSMENT:  Tyler Pratt is a 67 y.o. 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  -staging CT showed enlarged abdominal adenopathy, consistent with metastatic disease. -He is currently on Orgovyx 120 mg daily and Xtandi 160 mg daily started in June 2023 -He has had excellent response to treatment, his PSA level has been undetectable since October 2023 -He is clinically doing very well, no complaints except mild chronic low back pain.  He is tolerating treatment well. -He will continue follow-up with his urologist Dr. Alyson Ingles every 6 months who prescribes Orgovyx for him    PLAN: -lab reviewed - I prescribe Gabapentin 100 mg -I refill Xtandi.  He is on drug replacement program. - Encourage pt to exercise -f/u in 3 months   INTERVAL HISTORY:  Tyler Pratt is here for a follow up of Prostate Cancer He was last seen by  Dr.Shadad on 04/05/2023 He presents to the clinic alone. Pt states he was with out the [facility] for 2 weeks. Pt states he have a half a bottle at home. Prior to diagnoses he state that he had urinary problems. 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 Pt states that he has family but  they are not supportive. Pt states he is dealing hot flashes and it interrupts his sleep. Pt states he has been trying to walk to stay fit. Pt denies pain other than his back pain prior to be diagnose.      All other systems were reviewed with the patient and are negative.  MEDICAL HISTORY:  Past Medical History:  Diagnosis Date   High cholesterol    Hypertension    Prostate CA [facility])     SURGICAL HISTORY: History reviewed. No pertinent surgical history.  I have reviewed the social history and family history with the patient and they are unchanged from previous note.  ALLERGIES:  has No Known Allergies.  MEDICATIONS:  Current Outpatient Medications  Medication Sig Dispense Refill   amLODipine (NORVASC) 10 MG tablet Take 1 tablet (10 mg total) by mouth daily. 90 tablet 0   enzalutamide (XTANDI) 40 MG tablet Take 4 tablets (177m) by mouth once daily as directed by physician. 120 tablet 2   ezetimibe (ZETIA) 10 MG tablet Take 1 tablet (10 mg total) by mouth daily. 90 tablet 3   losartan-hydrochlorothiazide (HYZAAR) 50-12.5 MG tablet Take 1 tablet by mouth daily. 90 tablet 0   Relugolix (ORGOVYX) 120 MG TABS Take 1 tablet (120 mg total) by mouth daily. 30 tablet 11   rosuvastatin (CRESTOR) 20 MG tablet Take 1 tablet (20 mg total) by mouth daily. 90 tablet 3   traZODone (DESYREL) 150 MG tablet Take 1 tablet (150  mg total) by mouth at bedtime. 90 tablet 1   No current facility-administered medications for this visit.    PHYSICAL EXAMINATION: ECOG PERFORMANCE STATUS: 0 - Asymptomatic  Vitals:   05/28/22 1025  BP: 125/73  Pulse: 84  Resp: 15  SpO2: 100%   Wt Readings from Last 3 Encounters:  05/28/22 189 lb 12.8 oz (86.1 kg)  04/25/22 191 lb (86.6 kg)  04/04/22 188 lb 1.6 oz (85.3 kg)     NECK: (-)supple, thyroid normal size, non-tender, without nodularity LYMPH: (-)  no palpable lymphadenopathy in the cervical, axillary  LUNGS: (-) clear to auscultation and percussion  with normal breathing effort HEART:(-)  regular rate & rhythm and no murmurs and no lower extremity edema ABDOMEN:(-) abdomen soft, non-tender and normal bowel sounds  LABORATORY DATA:  I have reviewed the data as listed    Latest Ref Rng & Units 05/28/2022   10:07 AM 04/04/2022    3:09 PM 12/28/2021    3:13 PM  CBC  WBC 4.0 - 10.5 K/uL 9.5  9.7  8.1   Hemoglobin 13.0 - 17.0 g/dL 12.1  12.0  12.2   Hematocrit 39.0 - 52.0 % 35.5  35.3  35.7   Platelets 150 - 400 K/uL 328  363  324         Latest Ref Rng & Units 04/04/2022    3:09 PM 12/28/2021    3:13 PM 10/25/2021    4:41 PM  CMP  Glucose 70 - 99 mg/dL 132  126  93   BUN 8 - 23 mg/dL 15  16  17   $ Creatinine 0.61 - 1.24 mg/dL 1.13  1.13  1.16   Sodium 135 - 145 mmol/L 139  138  139   Potassium 3.5 - 5.1 mmol/L 4.1  3.8  4.0   Chloride 98 - 111 mmol/L 104  102  98   CO2 22 - 32 mmol/L 27  29  24   $ Calcium 8.9 - 10.3 mg/dL 9.1  9.4  9.3   Total Protein 6.5 - 8.1 g/dL 6.6  6.6  6.7   Total Bilirubin 0.3 - 1.2 mg/dL 0.4  0.3  0.3   Alkaline Phos 38 - 126 U/L 71  66  84   AST 15 - 41 U/L 16  15  23   $ ALT 0 - 44 U/L 14  13  19       $ RADIOGRAPHIC STUDIES: I have personally reviewed the radiological images as listed and agreed with the findings in the report. No results found.    No orders of the defined types were placed in this encounter.  All questions were answered. The patient knows to call the clinic with any problems, questions or concerns. No barriers to learning was detected. The total time spent in the appointment was 30 minutes.     Truitt Merle, MD 05/28/2022   Felicity Coyer, CMA, am acting as scribe for Truitt Merle, MD.   I have reviewed the above documentation for accuracy and completeness, and I agree with the above.

## 2022-05-28 NOTE — Addendum Note (Signed)
Addended by: Truitt Merle on: 05/28/2022 01:49 PM   Modules accepted: Orders

## 2022-05-30 LAB — PROSTATE-SPECIFIC AG, SERUM (LABCORP): Prostate Specific Ag, Serum: <0.1 ng/mL (ref 0.0–4.0)

## 2022-06-03 ENCOUNTER — Other Ambulatory Visit: Payer: Self-pay | Admitting: Family Medicine

## 2022-06-03 DIAGNOSIS — I10 Essential (primary) hypertension: Secondary | ICD-10-CM

## 2022-06-04 ENCOUNTER — Other Ambulatory Visit: Payer: Self-pay | Admitting: Nurse Practitioner

## 2022-06-04 ENCOUNTER — Other Ambulatory Visit: Payer: Medicare Other

## 2022-06-04 ENCOUNTER — Other Ambulatory Visit: Payer: Self-pay

## 2022-06-04 DIAGNOSIS — I10 Essential (primary) hypertension: Secondary | ICD-10-CM

## 2022-06-04 MED ORDER — AMLODIPINE BESYLATE 10 MG PO TABS
10.0000 mg | ORAL_TABLET | Freq: Every day | ORAL | 1 refills | Status: DC
  Filled 2022-06-04: qty 90, 90d supply, fill #0
  Filled 2022-09-12 – 2022-09-20 (×2): qty 90, 90d supply, fill #1

## 2022-06-04 NOTE — Telephone Encounter (Signed)
Requested Prescriptions  Pending Prescriptions Disp Refills   amLODipine (NORVASC) 10 MG tablet 90 tablet 1    Sig: Take 1 tablet (10 mg total) by mouth daily.     Cardiovascular: Calcium Channel Blockers 2 Passed - 06/03/2022  7:55 AM      Passed - Last BP in normal range    BP Readings from Last 1 Encounters:  05/28/22 125/73         Passed - Last Heart Rate in normal range    Pulse Readings from Last 1 Encounters:  05/28/22 84         Passed - Valid encounter within last 6 months    Recent Outpatient Visits           1 month ago Primary hypertension   [place], NP   5 months ago Essential hypertension   [place], Jarome Matin, RPH-CPP   7 months ago Essential hypertension   [place], NP   10 months ago Essential hypertension   [place], Vernia Buff, NP   1 year ago Essential hypertension   [place], [place], [place]       Future Appointments             In 1 month Gildardo Pounds, NP [place]   In 5 months McKenzie, Candee Furbish, MD [facility] [place]

## 2022-06-05 ENCOUNTER — Other Ambulatory Visit: Payer: Self-pay

## 2022-06-05 MED ORDER — LOSARTAN POTASSIUM-HCTZ 50-12.5 MG PO TABS
1.0000 | ORAL_TABLET | Freq: Every day | ORAL | 0 refills | Status: DC
  Filled 2022-06-05: qty 90, 90d supply, fill #0

## 2022-06-13 ENCOUNTER — Telehealth: Payer: Self-pay

## 2022-06-13 NOTE — Telephone Encounter (Signed)
Pt called stating that he's having 8/10 pain in his LUA under his rib area.  Pt stated the pain has been going on for 4 to 5 days.  Pt stated he's taking Excedrin '1000mg'$  (2ea '500mg'$  tabs) for his pain.  Pt stated the pain is sharp and constant.  Pt stated he also has 7 to 8 blisters on his legs and chest.  Pt stated his wife had a heart attack and he's unable to come in today but if Dr. Burr Medico could call him he would be available to speak with him.  Notified Dr. Burr Medico of the pt's call.

## 2022-06-15 ENCOUNTER — Inpatient Hospital Stay: Payer: Medicare Other | Attending: Hematology | Admitting: Hematology

## 2022-06-15 ENCOUNTER — Encounter: Payer: Self-pay | Admitting: Hematology

## 2022-06-15 ENCOUNTER — Telehealth: Payer: Self-pay

## 2022-06-15 ENCOUNTER — Other Ambulatory Visit: Payer: Self-pay

## 2022-06-15 VITALS — BP 133/79 | HR 79 | Temp 97.8°F | Resp 17 | Ht 70.0 in | Wt 191.3 lb

## 2022-06-15 DIAGNOSIS — I10 Essential (primary) hypertension: Secondary | ICD-10-CM | POA: Insufficient documentation

## 2022-06-15 DIAGNOSIS — Z79899 Other long term (current) drug therapy: Secondary | ICD-10-CM | POA: Diagnosis not present

## 2022-06-15 DIAGNOSIS — C775 Secondary and unspecified malignant neoplasm of intrapelvic lymph nodes: Secondary | ICD-10-CM | POA: Diagnosis present

## 2022-06-15 DIAGNOSIS — C61 Malignant neoplasm of prostate: Secondary | ICD-10-CM | POA: Diagnosis not present

## 2022-06-15 NOTE — Telephone Encounter (Addendum)
Called patient made him aware of CT appointment Monday 3-4 at 730 am and set his phone appointment for Wednesday at 8 am 3-6 to go over CT Results   ----- Message from Truitt Merle, MD sent at 06/15/2022  9:34 AM EST ----- Baxter Flattery, please get his stat CT scan approved  [place], please schedule his CT ASAP and schedule a phone visit after CT  Thanks  Krista Blue

## 2022-06-15 NOTE — Progress Notes (Signed)
[place]   Telephone:([phone] Fax:(336) [phone]   Clinic Follow up Note   Patient Care Team: Gildardo Pounds, NP as PCP - General (Nurse Practitioner) Katheren Puller, RN as Oncology Nurse Navigator Duffy, Rodman Pickle, LCSW as Social Worker (Licensed Clinical Social Worker) Truitt Merle, MD as Attending Physician (Hematology and Oncology)  Date of Service:  06/15/2022  CHIEF COMPLAINT: f/u of  Prostate Cancer   CURRENT THERAPY:   Orgovyx 120 mg daily started in June 2023.   Xtandi 160 mg daily started on November 10, 2021.    ASSESSMENT:  Tyler Pratt is a 67 y.o. male with   Left side low chest/upper abdominal pain -Started about 3 to 4 weeks ago, persistent, worse with position -Etiology is unclear, exam was unremarkable, he is not hypoxic on 6 minutes walking -Due to his underlying metastatic prostate cancer, I will get a CT chest, abdomen pelvis with contrast, to rule out PE and other etiology -I will call him with the CT scan results -His pain is tolerable, he has been using Tylenol as needed.  2. 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  -staging CT showed enlarged abdominal adenopathy, consistent with metastatic disease. -He is currently on Orgovyx 120 mg daily and Xtandi 160 mg daily started in June 2023 -He has had excellent response to treatment, his PSA level has been undetectable since October 2023 -He is clinically doing very well, no complaints except mild chronic low back pain.  He is tolerating treatment well. -He will continue follow-up with his urologist Dr. Alyson Ingles every 6 months who prescribes Orgovyx for him     PLAN: -CT chest, abdomen pelvis with contrast, rule out PE and other etiology for his left side  chest/abdomen pain  -phone visit after CT    INTERVAL HISTORY:  Tyler Pratt is here for a follow up of  Prostate Cancer  He was last seen by me on 05/28/2022 He presents to the clinic alone. Pt stated he is having some pain on the left side of his abdomen and it started a month ago an he also explain he is having blisters pop up on his body. The pain is stabbing and constant pain. Pt denies having SOB. Pt has no change of appetite and energy level. Pt denies having fever. Pt state the pain is below the rib cage on the left side. Denies having diarrhea and nausea and vomiting      All other systems were reviewed with the patient and are negative.  MEDICAL HISTORY:  Past Medical History:  Diagnosis Date   High cholesterol    Hypertension    Prostate CA [facility])     SURGICAL HISTORY: History reviewed. No pertinent surgical history.  I have reviewed the social history and family history with the patient and they are unchanged from previous note.  ALLERGIES:  has No Known Allergies.  MEDICATIONS:  Current Outpatient Medications  Medication Sig Dispense Refill   amLODipine (NORVASC) 10 MG tablet Take 1 tablet (10 mg total) by mouth daily. 90 tablet 1   enzalutamide (XTANDI) 40 MG tablet Take 4 tablets ('160mg'$ ) by mouth once daily as directed by physician. 120 tablet 2   ezetimibe (ZETIA) 10 MG tablet Take 1 tablet (10 mg total) by mouth  daily. 90 tablet 3   gabapentin (NEURONTIN) 100 MG capsule Take 1-3 capsules (100-300 mg total) by mouth at bedtime. Start a 1 cap at night for hot flushes, ok to increase to 2 caps then 3 caps in second and third week if tolerates well 60 capsule 0   losartan-hydrochlorothiazide (HYZAAR) 50-12.5 MG tablet Take 1 tablet by mouth daily. 90 tablet 0   Relugolix (ORGOVYX) 120 MG TABS Take 1 tablet (120 mg total) by mouth daily. 30 tablet 11   rosuvastatin (CRESTOR) 20 MG tablet Take 1 tablet (20 mg total) by mouth daily. 90 tablet 3   traZODone (DESYREL) 150  MG tablet Take 1 tablet (150 mg total) by mouth at bedtime. 90 tablet 1   No current facility-administered medications for this visit.    PHYSICAL EXAMINATION: ECOG PERFORMANCE STATUS: 1 - Symptomatic but completely ambulatory  Vitals:   06/15/22 0902  BP: 133/79  Pulse: 79  Resp: 17  Temp: 97.8 F (36.6 C)  SpO2: 100%   Wt Readings from Last 3 Encounters:  06/15/22 191 lb 4.8 oz (86.8 kg)  05/28/22 189 lb 12.8 oz (86.1 kg)  04/25/22 191 lb (86.6 kg)     SKIN: skin color, texture, turgor are normal, (+) rashes or   ABDOMEN:(-) abdomen soft, (-) non-tender and normal bowel sounds  LABORATORY DATA:  I have reviewed the data as listed    Latest Ref Rng & Units 05/28/2022   10:07 AM 04/04/2022    3:09 PM 12/28/2021    3:13 PM  CBC  WBC 4.0 - 10.5 K/uL 9.5  9.7  8.1   Hemoglobin 13.0 - 17.0 g/dL 12.1  12.0  12.2   Hematocrit 39.0 - 52.0 % 35.5  35.3  35.7   Platelets 150 - 400 K/uL 328  363  324         Latest Ref Rng & Units 05/28/2022   10:07 AM 04/04/2022    3:09 PM 12/28/2021    3:13 PM  CMP  Glucose 70 - 99 mg/dL 118  132  126   BUN 8 - 23 mg/dL '15  15  16   '$ Creatinine 0.61 - 1.24 mg/dL 0.98  1.13  1.13   Sodium 135 - 145 mmol/L 139  139  138   Potassium 3.5 - 5.1 mmol/L 4.0  4.1  3.8   Chloride 98 - 111 mmol/L 104  104  102   CO2 22 - 32 mmol/L '29  27  29   '$ Calcium 8.9 - 10.3 mg/dL 9.5  9.1  9.4   Total Protein 6.5 - 8.1 g/dL 6.9  6.6  6.6   Total Bilirubin 0.3 - 1.2 mg/dL 0.4  0.4  0.3   Alkaline Phos 38 - 126 U/L 76  71  66   AST 15 - 41 U/L '13  16  15   '$ ALT 0 - 44 U/L '14  14  13       '$ RADIOGRAPHIC STUDIES: I have personally reviewed the radiological images as listed and agreed with the findings in the report. No results found.    Orders Placed This Encounter  Procedures   CT CHEST ABDOMEN PELVIS W CONTRAST    CTA protocol to rule out PE    Standing Status:   Future    Standing Expiration Date:   06/15/2023    Order Specific Question:   If  indicated for the ordered procedure, I authorize the administration of contrast media per Radiology protocol  Answer:   Yes    Order Specific Question:   Does the patient have a contrast media/X-ray dye allergy?    Answer:   Yes    Order Specific Question:   Preferred imaging location?    Answer:   [facility]    Order Specific Question:   Release to patient    Answer:   Immediate    Order Specific Question:   Is Oral Contrast requested for this exam?    Answer:   Yes, Per Radiology protocol   All questions were answered. The patient knows to call the clinic with any problems, questions or concerns. No barriers to learning was detected. The total time spent in the appointment was 30 minutes.     Truitt Merle, MD 06/15/2022   Felicity Coyer, CMA, am acting as scribe for Truitt Merle, MD.   I have reviewed the above documentation for accuracy and completeness, and I agree with the above.

## 2022-06-18 ENCOUNTER — Ambulatory Visit (HOSPITAL_COMMUNITY): Admission: RE | Admit: 2022-06-18 | Payer: Medicare Other | Source: Ambulatory Visit

## 2022-06-18 ENCOUNTER — Other Ambulatory Visit: Payer: Self-pay | Admitting: Hematology

## 2022-06-18 DIAGNOSIS — C61 Malignant neoplasm of prostate: Secondary | ICD-10-CM

## 2022-06-19 NOTE — Progress Notes (Deleted)
[place]   Telephone:(336) [phone] Fax:(336) [phone]   Clinic Follow up Note   Patient Care Team: Gildardo Pounds, NP as PCP - General (Nurse Practitioner) Katheren Puller, RN as Oncology Nurse Navigator Duffy, Rodman Pickle, LCSW as Social Worker (Licensed Clinical Social Worker) Truitt Merle, MD as Attending Physician (Hematology and Oncology)  Date of Service:  06/19/2022  I connected with Tyler Pratt on 06/19/2022 at  8:00 AM EST by {Blank single:19197::"video enabled telemedicine visit","telephone visit"} and verified that I am speaking with the correct person using two identifiers.  I discussed the limitations, risks, security and privacy concerns of performing an evaluation and management service by telephone and the availability of in person appointments. I also discussed with the patient that there may be a patient responsible charge related to this service. The patient expressed understanding and agreed to proceed.   Other persons participating in the visit and their role in the encounter:  ***  Patient's location:  *** Provider's location:  ***  CHIEF COMPLAINT: f/u of Prostate Cancer    CURRENT THERAPY:  Orgovyx 120 mg daily started in June 2023.   Xtandi 160 mg daily started on November 10, 2021.   ASSESSMENT & PLAN: *** Tyler Pratt is a 67 y.o. male with   ***   ***  No problem-specific Assessment & Plan notes found for this encounter.    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 lymph node [facility]) Staging form: Prostate, AJCC 8th Edition - Clinical stage from 10/16/2021: Stage IVB (cT1c, cN1, cM1b, PSA: 1523, Grade Group: 5) - Unsigned Histopathologic type: Adenocarcinoma, NOS Stage prefix: Initial diagnosis Prostate specific antigen (PSA) range: 20 or greater Gleason primary pattern: 4 Gleason secondary pattern: 5 Gleason score: 9 Histologic grading system: 5 grade  system Number of biopsy cores examined: 12 Number of biopsy cores positive: 12 Location of positive needle core biopsies: Both sides     Prostate cancer metastatic to intrapelvic lymph node ([place])  10/15/2021 Initial Diagnosis   Prostate cancer metastatic to intrapelvic lymph node ([facility])      INTERVAL HISTORY: *** Tyler Pratt was contacted for a follow up of Prostate Cancer. He was last seen by me on 06/15/2022.    All other systems were reviewed with the patient and are negative.  MEDICAL HISTORY:  Past Medical History:  Diagnosis Date   High cholesterol    Hypertension    Prostate CA Loc [facility])     SURGICAL HISTORY: No past surgical history on file.  I have reviewed the social history and family history with the patient and they are unchanged from previous note.  ALLERGIES:  has No Known Allergies.  MEDICATIONS:  Current Outpatient Medications  Medication Sig Dispense Refill   amLODipine (NORVASC) 10 MG tablet Take 1 tablet (10 mg total) by mouth daily. 90 tablet 1   enzalutamide (XTANDI) 40 MG tablet Take 4 tablets ('160mg'$ ) by mouth once daily as directed by physician. 120 tablet 2   ezetimibe (ZETIA) 10 MG tablet Take 1 tablet (10 mg total) by mouth daily. 90 tablet 3   gabapentin (NEURONTIN) 100 MG capsule Take 1-3 capsules (100-300 mg total) by mouth at bedtime. Start a 1 cap at night for hot flushes, ok to increase to 2 caps then 3 caps in second and third week if tolerates well 60 capsule 0   losartan-hydrochlorothiazide (HYZAAR) 50-12.5 MG tablet Take 1 tablet by mouth  daily. 90 tablet 0   Relugolix (ORGOVYX) 120 MG TABS Take 1 tablet (120 mg total) by mouth daily. 30 tablet 11   rosuvastatin (CRESTOR) 20 MG tablet Take 1 tablet (20 mg total) by mouth daily. 90 tablet 3   traZODone (DESYREL) 150 MG tablet Take 1 tablet (150 mg total) by mouth at bedtime. 90 tablet 1   No current facility-administered medications for this visit.    PHYSICAL EXAMINATION: ECOG  PERFORMANCE STATUS: {CHL ONC ECOG [phone]}  There were no vitals filed for this visit. Wt Readings from Last 3 Encounters:  06/15/22 191 lb 4.8 oz (86.8 kg)  05/28/22 189 lb 12.8 oz (86.1 kg)  04/25/22 191 lb (86.6 kg)    *** No vitals taken today, Exam not performed today  LABORATORY DATA:  I have reviewed the data as listed    Latest Ref Rng & Units 05/28/2022   10:07 AM 04/04/2022    3:09 PM 12/28/2021    3:13 PM  CBC  WBC 4.0 - 10.5 K/uL 9.5  9.7  8.1   Hemoglobin 13.0 - 17.0 g/dL 12.1  12.0  12.2   Hematocrit 39.0 - 52.0 % 35.5  35.3  35.7   Platelets 150 - 400 K/uL 328  363  324         Latest Ref Rng & Units 05/28/2022   10:07 AM 04/04/2022    3:09 PM 12/28/2021    3:13 PM  CMP  Glucose 70 - 99 mg/dL 118  132  126   BUN 8 - 23 mg/dL '15  15  16   '$ Creatinine 0.61 - 1.24 mg/dL 0.98  1.13  1.13   Sodium 135 - 145 mmol/L 139  139  138   Potassium 3.5 - 5.1 mmol/L 4.0  4.1  3.8   Chloride 98 - 111 mmol/L 104  104  102   CO2 22 - 32 mmol/L '29  27  29   '$ Calcium 8.9 - 10.3 mg/dL 9.5  9.1  9.4   Total Protein 6.5 - 8.1 g/dL 6.9  6.6  6.6   Total Bilirubin 0.3 - 1.2 mg/dL 0.4  0.4  0.3   Alkaline Phos 38 - 126 U/L 76  71  66   AST 15 - 41 U/L '13  16  15   '$ ALT 0 - 44 U/L '14  14  13       '$ RADIOGRAPHIC STUDIES: I have personally reviewed the radiological images as listed and agreed with the findings in the report. No results found.    No orders of the defined types were placed in this encounter.  All questions were answered. The patient knows to call the clinic with any problems, questions or concerns. No barriers to learning was detected. The total time spent in the appointment was {CHL ONC TIME VISIT - [id number].     Baldemar Friday, CMA 06/19/2022   Felicity Coyer am acting as scribe for Truitt Merle, MD.   {Add scribe attestation statement}

## 2022-06-20 ENCOUNTER — Other Ambulatory Visit: Payer: Self-pay

## 2022-06-20 ENCOUNTER — Inpatient Hospital Stay: Payer: Medicare Other | Admitting: Hematology

## 2022-06-21 ENCOUNTER — Ambulatory Visit (HOSPITAL_COMMUNITY): Payer: Medicare Other

## 2022-06-25 ENCOUNTER — Inpatient Hospital Stay: Payer: Medicare Other | Admitting: Hematology

## 2022-06-25 ENCOUNTER — Telehealth: Payer: Self-pay | Admitting: Hematology

## 2022-06-25 DIAGNOSIS — C61 Malignant neoplasm of prostate: Secondary | ICD-10-CM

## 2022-06-25 NOTE — Telephone Encounter (Signed)
Contacted patient to scheduled appointments. Patient is aware of appointments that are scheduled.   

## 2022-06-27 ENCOUNTER — Other Ambulatory Visit: Payer: Medicare Other

## 2022-06-28 ENCOUNTER — Other Ambulatory Visit: Payer: Self-pay

## 2022-06-28 DIAGNOSIS — C61 Malignant neoplasm of prostate: Secondary | ICD-10-CM

## 2022-06-28 DIAGNOSIS — C775 Secondary and unspecified malignant neoplasm of intrapelvic lymph nodes: Secondary | ICD-10-CM

## 2022-07-04 ENCOUNTER — Ambulatory Visit: Payer: Medicare Other | Admitting: Hematology

## 2022-07-04 ENCOUNTER — Other Ambulatory Visit: Payer: Medicare Other

## 2022-07-05 ENCOUNTER — Ambulatory Visit (HOSPITAL_COMMUNITY)
Admission: RE | Admit: 2022-07-05 | Discharge: 2022-07-05 | Disposition: A | Discharge status: Home / Self Care | Payer: Medicare Other | Source: Ambulatory Visit | Attending: Hematology | Admitting: Hematology

## 2022-07-05 DIAGNOSIS — C61 Malignant neoplasm of prostate: Secondary | ICD-10-CM | POA: Insufficient documentation

## 2022-07-05 MED ORDER — IOHEXOL 350 MG/ML SOLN
100.0000 mL | Freq: Once | INTRAVENOUS | Status: AC | PRN
  Administered 2022-07-05: 100 mL via INTRAVENOUS

## 2022-07-17 NOTE — Progress Notes (Unsigned)
Tyler Pratt   Telephone:(336) [phone] Fax:(336) [phone]   Clinic Follow up Note   Patient Care Team: Gildardo Pounds, NP as PCP - General (Nurse Practitioner) Katheren Puller, RN as Oncology Nurse Navigator Duffy, Rodman Pickle, LCSW as Social Worker (Licensed Clinical Social Worker) Truitt Merle, MD as Attending Physician (Hematology and Oncology)  Date of Service:  07/18/2022  I connected with Tyler Pratt on 07/18/2022 at  1:00 PM EDT by telephone visit and verified that I am speaking with the correct person using two identifiers.  I discussed the limitations, risks, security and privacy concerns of performing an evaluation and management service by telephone and the availability of in person appointments. I also discussed with the patient that there may be a patient responsible charge related to this service. The patient expressed understanding and agreed to proceed.   Other persons participating in the visit and their role in the encounter:  None   Patient's location:  Home  Provider's location:  Office   CHIEF COMPLAINT: f/u of  Prostate Cancer   CURRENT THERAPY:   Orgovyx 120 mg daily started in June 2023.   Xtandi 160 mg daily started on November 10, 2021.  ASSESSMENT & PLAN:  Tyler Pratt is a 67 y.o. 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  -staging CT showed enlarged abdominal adenopathy, consistent with metastatic disease. -He is currently on Orgovyx 120 mg daily and Xtandi 160 mg daily started in June 2023 -He has had excellent response to treatment, his PSA level has been undetectable since October 2023 -He is clinically doing very well,  and tolerates treatment well. -He will continue follow-up with his urologist Dr.  Alyson Ingles every 6 months who prescribes Orgovyx for him -I personally reviewed his recent CT CAP w contrast which was negative for PE or other acute findings, his previous adenopathy has resolved, indicating good response to therapy   Left low chest pain -He has persistent left lower chest pain, no skin lesions.  His CT scan from July 05, 2022 was unrevealing, no PE or other acute process. -He has appointment with his PCP soon, he will discuss with his PCP. -We discussed referral to neurology if needed. -I previously prescribed low-dose gabapentin 100 mg, he tried it once but could not sleep so he has not taking it.  I encouraged him to try again for his pain control.  Plan -CT scan reviewed -He will follow-up with PCP for his left-sided chest pain, he will call me back if he needs a referral to neurology. -I will see him back in May as scheduled.  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 lymph node [facility]) Staging form: Prostate, AJCC 8th Edition - Clinical stage from 10/16/2021: Stage IVB (cT1c, cN1, cM1b, PSA: 1523, Grade Group: 5) - Unsigned Histopathologic type: Adenocarcinoma, NOS Stage prefix: Initial diagnosis Prostate specific antigen (PSA) range: 20 or greater Gleason primary pattern: 4 Gleason secondary pattern: 5 Gleason score: 9 Histologic grading system: 5 grade system Number of biopsy cores examined: 12 Number of biopsy cores positive: 12 Location of positive needle core biopsies: Both sides     Prostate cancer metastatic to intrapelvic lymph node  10/15/2021 Initial  Diagnosis   Prostate cancer metastatic to intrapelvic lymph node ([place])      INTERVAL HISTORY:  Tyler Pratt was contacted for a follow up of his recent CT scan.He was last seen by me on 06/15/2022.  He has persistent left lower chest pain, he tried gabapentin once but could not sleep at night, and has not been taking it  since then.  He denies any other new symptoms.   All other systems were reviewed with the patient and are negative.  MEDICAL HISTORY:  Past Medical History:  Diagnosis Date   High cholesterol    Hypertension    Prostate CA     SURGICAL HISTORY: History reviewed. No pertinent surgical history.  I have reviewed the social history and family history with the patient and they are unchanged from previous note.  ALLERGIES:  has No Known Allergies.  MEDICATIONS:  Current Outpatient Medications  Medication Sig Dispense Refill   amLODipine (NORVASC) 10 MG tablet Take 1 tablet (10 mg total) by mouth daily. 90 tablet 1   enzalutamide (XTANDI) 40 MG tablet Take 4 tablets (160mg ) by mouth once daily as directed by physician. 120 tablet 2   ezetimibe (ZETIA) 10 MG tablet Take 1 tablet (10 mg total) by mouth daily. 90 tablet 3   gabapentin (NEURONTIN) 100 MG capsule Take 1-3 capsules (100-300 mg total) by mouth at bedtime. Start a 1 cap at night for hot flushes, ok to increase to 2 caps then 3 caps in second and third week if tolerates well 60 capsule 0   losartan-hydrochlorothiazide (HYZAAR) 50-12.5 MG tablet Take 1 tablet by mouth daily. 90 tablet 0   Relugolix (ORGOVYX) 120 MG TABS Take 1 tablet (120 mg total) by mouth daily. 30 tablet 11   rosuvastatin (CRESTOR) 20 MG tablet Take 1 tablet (20 mg total) by mouth daily. 90 tablet 3   traZODone (DESYREL) 150 MG tablet Take 1 tablet (150 mg total) by mouth at bedtime. 90 tablet 1   No current facility-administered medications for this visit.    PHYSICAL EXAMINATION: ECOG PERFORMANCE STATUS: 1 - Symptomatic but completely ambulatory  There were no vitals filed for this visit. Wt Readings from Last 3 Encounters:  06/15/22 191 lb 4.8 oz (86.8 kg)  05/28/22 189 lb 12.8 oz (86.1 kg)  04/25/22 191 lb (86.6 kg)     No vitals taken today, Exam not performed today  LABORATORY DATA:  I have reviewed the data as listed    Latest Ref Rng &  Units 05/28/2022   10:07 AM 04/04/2022    3:09 PM 12/28/2021    3:13 PM  CBC  WBC 4.0 - 10.5 K/uL 9.5  9.7  8.1   Hemoglobin 13.0 - 17.0 g/dL 12.1  12.0  12.2   Hematocrit 39.0 - 52.0 % 35.5  35.3  35.7   Platelets 150 - 400 K/uL 328  363  324         Latest Ref Rng & Units 05/28/2022   10:07 AM 04/04/2022    3:09 PM 12/28/2021    3:13 PM  CMP  Glucose 70 - 99 mg/dL 118  132  126   BUN 8 - 23 mg/dL 15  15  16    Creatinine 0.61 - 1.24 mg/dL 0.98  1.13  1.13   Sodium 135 - 145 mmol/L 139  139  138   Potassium 3.5 - 5.1 mmol/L 4.0  4.1  3.8   Chloride 98 - 111 mmol/L 104  104  102   CO2 22 - 32 mmol/L 29  27  29    Calcium 8.9 - 10.3 mg/dL 9.5  9.1  9.4   Total Protein 6.5 - 8.1 g/dL 6.9  6.6  6.6   Total Bilirubin 0.3 - 1.2 mg/dL 0.4  0.4  0.3   Alkaline Phos 38 - 126 U/L 76  71  66   AST 15 - 41 U/L 13  16  15    ALT 0 - 44 U/L 14  14  13        RADIOGRAPHIC STUDIES: I have personally reviewed the radiological images as listed and agreed with the findings in the report. No results found.    No orders of the defined types were placed in this encounter.  All questions were answered. The patient knows to call the clinic with any problems, questions or concerns. No barriers to learning was detected. The total time spent in the appointment was 15 minutes.     Truitt Merle, MD 07/18/2022   Felicity Coyer am acting as scribe for Truitt Merle, MD.   I have reviewed the above documentation for accuracy and completeness, and I agree with the above.

## 2022-07-18 ENCOUNTER — Inpatient Hospital Stay: Payer: Medicare Other | Attending: Oncology | Admitting: Hematology

## 2022-07-18 ENCOUNTER — Encounter: Payer: Self-pay | Admitting: Hematology

## 2022-07-18 DIAGNOSIS — C61 Malignant neoplasm of prostate: Secondary | ICD-10-CM | POA: Diagnosis not present

## 2022-07-18 DIAGNOSIS — C775 Secondary and unspecified malignant neoplasm of intrapelvic lymph nodes: Secondary | ICD-10-CM

## 2022-07-18 NOTE — Assessment & Plan Note (Addendum)
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  -staging CT showed enlarged abdominal adenopathy, consistent with metastatic disease. -He is currently on Orgovyx 120 mg daily and Xtandi 160 mg daily started in June 2023 -He has had excellent response to treatment, his PSA level has been undetectable since October 2023 -He is clinically doing very well,  and tolerates treatment well. -He will continue follow-up with his urologist Dr. Alyson Ingles every 6 months who prescribes Orgovyx for him -I personally reviewed his recent CT CAP w contrast which was negative for PE or other acute findings, his previous adenopathy has resolved, indicating good response to therapy

## 2022-07-20 ENCOUNTER — Other Ambulatory Visit: Payer: Self-pay

## 2022-07-20 ENCOUNTER — Telehealth: Payer: Self-pay | Admitting: Nurse Practitioner

## 2022-07-20 NOTE — Telephone Encounter (Signed)
Contacted [facility] to schedule their annual wellness visit. Welcome to Medicare visit Due by 12/16/22.  Zella Ball [facility] direct phone # [phone]    WTM before 12/16/22 per [employer]

## 2022-07-25 ENCOUNTER — Ambulatory Visit: Payer: Medicare Other | Attending: Nurse Practitioner | Admitting: Nurse Practitioner

## 2022-07-25 ENCOUNTER — Other Ambulatory Visit: Payer: Self-pay

## 2022-07-25 ENCOUNTER — Encounter: Payer: Self-pay | Admitting: Nurse Practitioner

## 2022-07-25 VITALS — BP 129/71 | HR 89 | Ht 70.0 in | Wt 185.8 lb

## 2022-07-25 DIAGNOSIS — R109 Unspecified abdominal pain: Secondary | ICD-10-CM

## 2022-07-25 DIAGNOSIS — I10 Essential (primary) hypertension: Secondary | ICD-10-CM | POA: Diagnosis not present

## 2022-07-25 DIAGNOSIS — Z1211 Encounter for screening for malignant neoplasm of colon: Secondary | ICD-10-CM

## 2022-07-25 DIAGNOSIS — Z79899 Other long term (current) drug therapy: Secondary | ICD-10-CM | POA: Insufficient documentation

## 2022-07-25 DIAGNOSIS — Z713 Dietary counseling and surveillance: Secondary | ICD-10-CM | POA: Insufficient documentation

## 2022-07-25 MED ORDER — DICLOFENAC SODIUM 1 % EX GEL
4.0000 g | Freq: Four times a day (QID) | CUTANEOUS | 6 refills | Status: DC
  Filled 2022-07-25: qty 200, 13d supply, fill #0

## 2022-07-25 NOTE — Progress Notes (Signed)
Assessment & Plan:  Tyler Pratt was seen today for hypertension.  Diagnoses and all orders for this visit:  Primary hypertension Continue amlodipine and hyzaar as prescribed.  Reminded to bring in blood pressure log for follow  up appointment.  RECOMMENDATIONS: DASH/Mediterranean Diets are healthier choices for HTN.    Colon cancer screening -     Ambulatory referral to Gastroenterology  Left sided abdominal pain -     Ambulatory referral to Gastroenterology -     diclofenac Sodium (VOLTAREN) 1 % GEL; Apply 4 g topically 4 (four) times daily. -     Urinalysis, Complete    Patient has been counseled on age-appropriate routine health concerns for screening and prevention. These are reviewed and up-to-date. Referrals have been placed accordingly. Immunizations are up-to-date or declined.    Subjective:   Chief Complaint  Patient presents with   Hypertension   Hypertension Pertinent negatives include no blurred vision, chest pain, headaches, malaise/fatigue, palpitations or shortness of breath.   Tyler Pratt 67 y.o. male presents to office today for follow up to HTN   PMH: prostate Ca (followed by Oncology), HTN, prediabetes, leukcytosis  Currently being followed by Oncology and Urolgy for malignant neoplasm of prostate.    HTN Blood pressure is well controlled today. He is taking amlodipine 10 mg daily and hyzaar 50-12.5 mg daily.  BP Readings from Last 3 Encounters:  07/25/22 129/71  06/15/22 133/79  05/28/22 125/73      Left sided chest pain Ongoing for several months. CT of abd/pelvis negative. Pain is constant but intensity of pain varies throughout the day. Pain described as mild, sharp, dull. Taking up to 1500 mg of tylenol 3 times per day to help ease pain. Denies any melena, N/V or hematochezia. Pain is not associated with any activity or intake of food. He denies diarrhea or constipation.  He was prescribed gabapentin previously for hot flashes but reports he  did not like the way it made him feel so this would not be a medication we could use at this time for his pain. He states he was told it could be nerve related. May need to see Neurology however we will rule out GI source first.       Review of Systems  Constitutional:  Negative for fever, malaise/fatigue and weight loss.  HENT: Negative.  Negative for nosebleeds.   Eyes: Negative.  Negative for blurred vision, double vision and photophobia.  Respiratory: Negative.  Negative for cough and shortness of breath.   Cardiovascular: Negative.  Negative for chest pain, palpitations and leg swelling.  Gastrointestinal:  Positive for abdominal pain. Negative for heartburn, nausea and vomiting.  Musculoskeletal: Negative.  Negative for myalgias.  Neurological: Negative.  Negative for dizziness, focal weakness, seizures and headaches.  Psychiatric/Behavioral: Negative.  Negative for suicidal ideas.     Past Medical History:  Diagnosis Date   High cholesterol    Hypertension    Prostate CA     History reviewed. No pertinent surgical history.  Family History  Problem Relation Age of Onset   Hypertension Mother    Cancer Mother    Hypertension Father    Cancer Father    Diabetes Maternal Grandmother    Heart disease Paternal Grandfather     Social History Reviewed with no changes to be made today.   Outpatient Medications Prior to Visit  Medication Sig Dispense Refill   amLODipine (NORVASC) 10 MG tablet Take 1 tablet (10 mg total) by mouth daily. 90  tablet 1   enzalutamide (XTANDI) 40 MG tablet Take 4 tablets (160mg ) by mouth once daily as directed by physician. 120 tablet 2   ezetimibe (ZETIA) 10 MG tablet Take 1 tablet (10 mg total) by mouth daily. 90 tablet 3   gabapentin (NEURONTIN) 100 MG capsule Take 1-3 capsules (100-300 mg total) by mouth at bedtime. Start a 1 cap at night for hot flushes, ok to increase to 2 caps then 3 caps in second and third week if tolerates well 60 capsule  0   losartan-hydrochlorothiazide (HYZAAR) 50-12.5 MG tablet Take 1 tablet by mouth daily. 90 tablet 0   Relugolix (ORGOVYX) 120 MG TABS Take 1 tablet (120 mg total) by mouth daily. 30 tablet 11   rosuvastatin (CRESTOR) 20 MG tablet Take 1 tablet (20 mg total) by mouth daily. 90 tablet 3   traZODone (DESYREL) 150 MG tablet Take 1 tablet (150 mg total) by mouth at bedtime. 90 tablet 1   No facility-administered medications prior to visit.    No Known Allergies     Objective:    BP 129/71   Pulse 89   Ht 5\' 10"  (1.778 m)   Wt 185 lb 12.8 oz (84.3 kg)   SpO2 98%   BMI 26.66 kg/m  Wt Readings from Last 3 Encounters:  07/25/22 185 lb 12.8 oz (84.3 kg)  06/15/22 191 lb 4.8 oz (86.8 kg)  05/28/22 189 lb 12.8 oz (86.1 kg)    Physical Exam Vitals and nursing note reviewed.  Constitutional:      Appearance: He is well-developed.  HENT:     Head: Normocephalic and atraumatic.  Cardiovascular:     Rate and Rhythm: Normal rate and regular rhythm.     Heart sounds: Normal heart sounds. No murmur heard.    No friction rub. No gallop.  Pulmonary:     Effort: Pulmonary effort is normal. No tachypnea or respiratory distress.     Breath sounds: Normal breath sounds. No decreased breath sounds, wheezing, rhonchi or rales.  Chest:     Chest wall: No tenderness.  Abdominal:     General: Bowel sounds are normal.     Palpations: Abdomen is soft.  Musculoskeletal:        General: Normal range of motion.     Cervical back: Normal range of motion.  Skin:    General: Skin is warm and dry.  Neurological:     Mental Status: He is alert and oriented to person, place, and time.     Coordination: Coordination normal.  Psychiatric:        Behavior: Behavior normal. Behavior is cooperative.        Thought Content: Thought content normal.        Judgment: Judgment normal.          Patient has been counseled extensively about nutrition and exercise as well as the importance of adherence with  medications and regular follow-up. The patient was given clear instructions to go to ER or return to medical center if symptoms don't improve, worsen or new problems develop. The patient verbalized understanding.   Follow-up: Return in about 3 months (around 10/24/2022).   Claiborne RiggZelda W Donis Pinder, FNP-BC [facility] [place], [place] [phone]   07/25/2022, 2:41 PM

## 2022-07-26 LAB — URINALYSIS, COMPLETE
Bilirubin, UA: NEGATIVE
Glucose, UA: NEGATIVE
Leukocytes,UA: NEGATIVE
Nitrite, UA: NEGATIVE
Protein,UA: NEGATIVE
RBC, UA: NEGATIVE
Specific Gravity, UA: 1.023 (ref 1.005–1.030)
Urobilinogen, Ur: 0.2 mg/dL (ref 0.2–1.0)
pH, UA: 6.5 (ref 5.0–7.5)

## 2022-07-26 LAB — MICROSCOPIC EXAMINATION
Bacteria, UA: NONE SEEN
Casts: NONE SEEN /lpf
Epithelial Cells (non renal): NONE SEEN /hpf (ref 0–10)
WBC, UA: NONE SEEN /hpf (ref 0–5)

## 2022-08-06 ENCOUNTER — Other Ambulatory Visit: Payer: Self-pay

## 2022-08-20 ENCOUNTER — Other Ambulatory Visit: Payer: Self-pay

## 2022-08-20 ENCOUNTER — Other Ambulatory Visit: Payer: Self-pay | Admitting: *Deleted

## 2022-08-20 ENCOUNTER — Other Ambulatory Visit: Payer: Self-pay | Admitting: Hematology

## 2022-08-20 DIAGNOSIS — C61 Malignant neoplasm of prostate: Secondary | ICD-10-CM

## 2022-08-20 MED ORDER — ENZALUTAMIDE 40 MG PO TABS
ORAL_TABLET | ORAL | 2 refills | Status: DC
Start: 2022-08-20 — End: 2022-12-28

## 2022-08-24 IMAGING — CT CT ABD-PEL WO/W CM
3 of 9 series · 11 of 46 positions shown, 17 images · IV contrast (agent unspecified)
Comparison: None Available.

CLINICAL DATA: Newly diagnosed high-risk prostate carcinoma.
Staging.

* Tracking Code: BO *
EXAM:
CT ABDOMEN AND PELVIS WITHOUT AND WITH CONTRAST
TECHNIQUE: Multidetector CT imaging of the abdomen and pelvis was performed
following the standard protocol before and following the bolus
administration of intravenous contrast.

[Series 2: axial st · axial · 0.78mm/px · z∈[-544,-259]mm · 4 of 96 slices shown, 9 images (1 of 2)]
[im 20/96  soft-tissue]
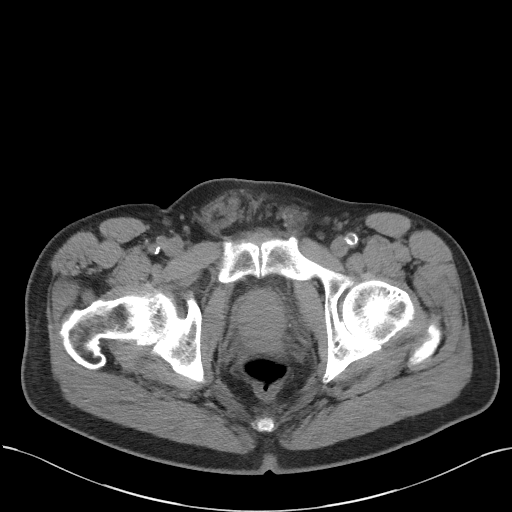
[im 20/96  lung]
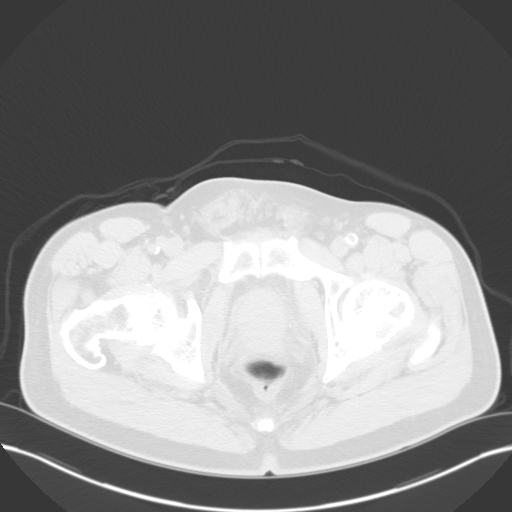
[im 20/96  bone]
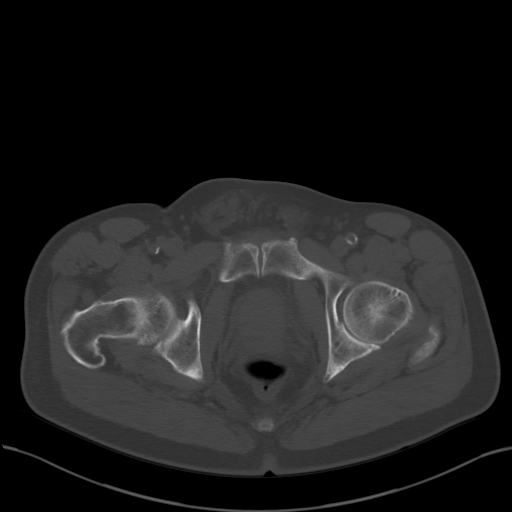
[im 39/96  soft-tissue]
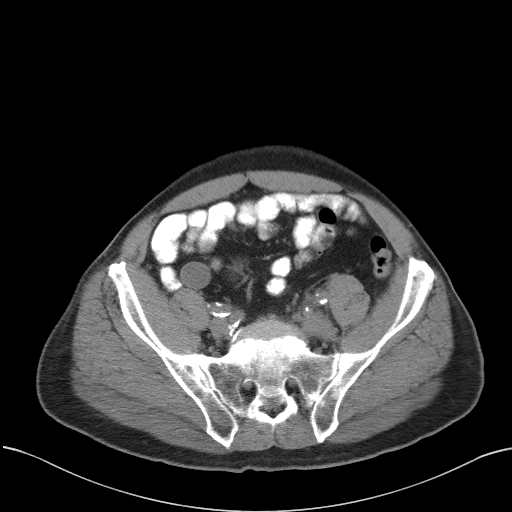
[im 39/96  lung]
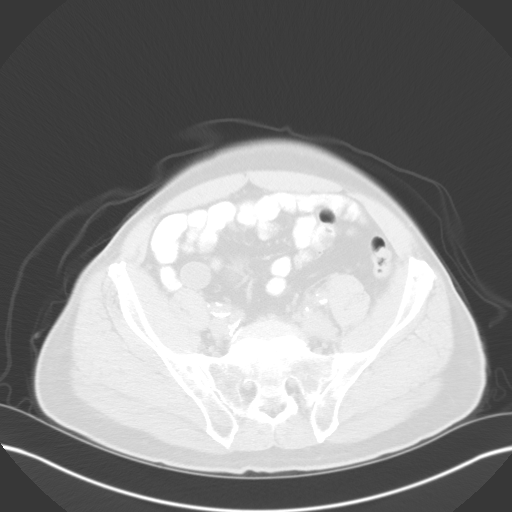
[im 58/96  soft-tissue]
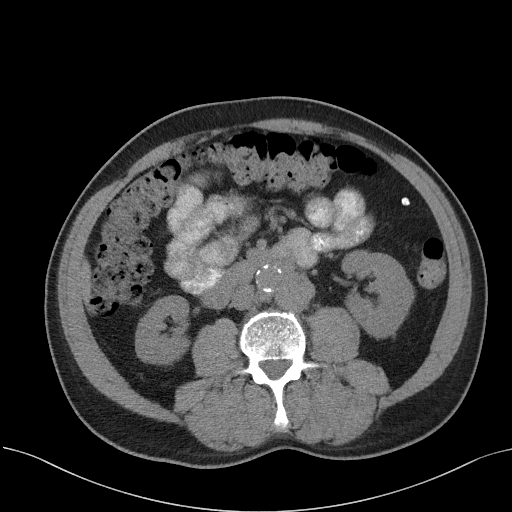
[im 58/96  lung]
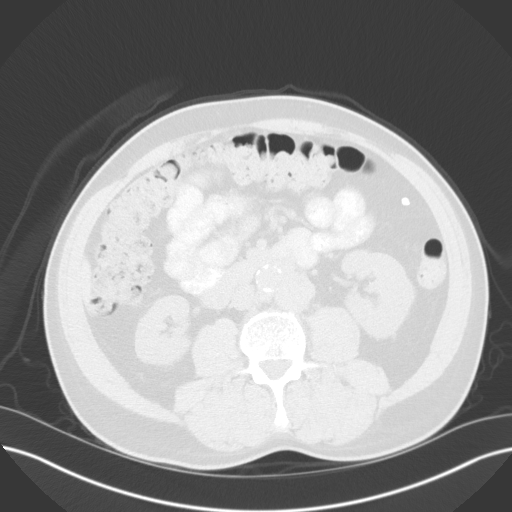
[im 77/96  soft-tissue]
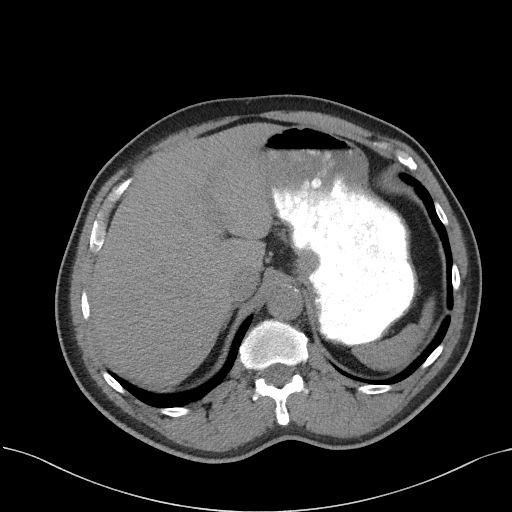
[im 77/96  lung]
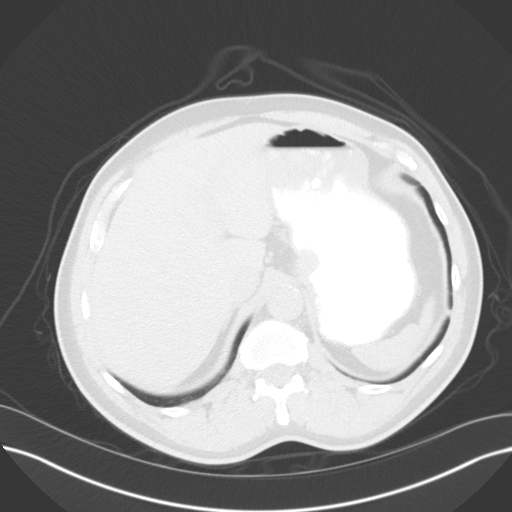

[Series 5: coronal st · coronal · 0.71mm/px · 3 of 108 slices shown, 4 images]
[im 27/108  soft-tissue]
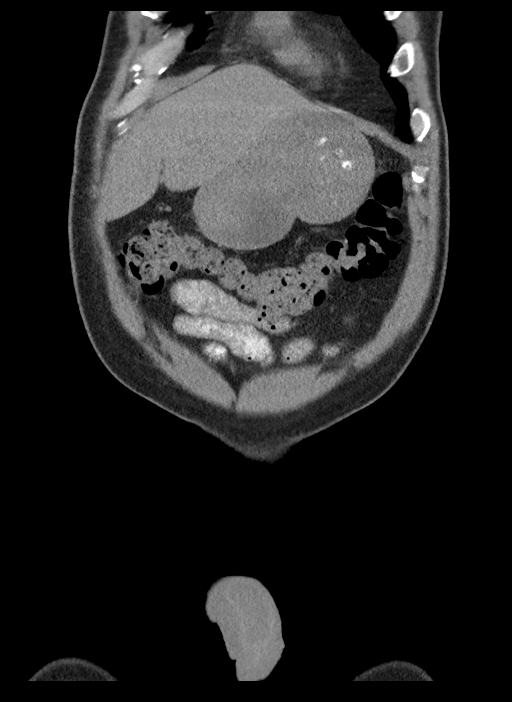
[im 54/108  soft-tissue]
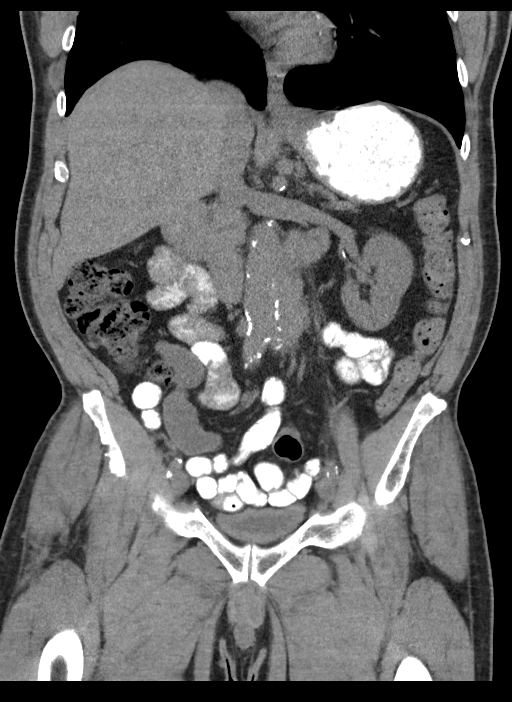
[im 54/108  bone]
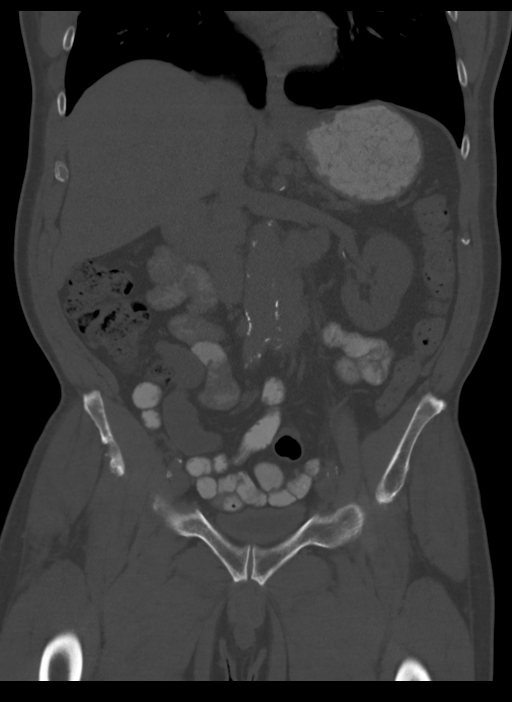
[im 81/108  soft-tissue]
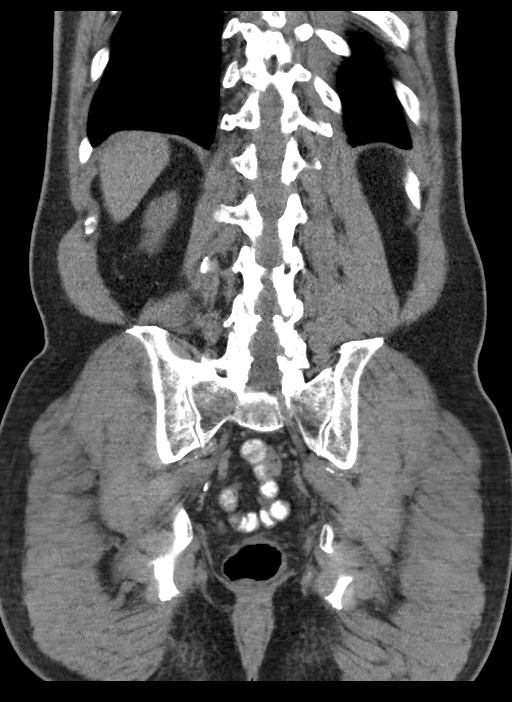

[Series 7: axial st · axial · 0.78mm/px · z∈[-544,-259]mm · 4 of 96 slices shown (2 of 2)]
[im 20/96  soft-tissue]
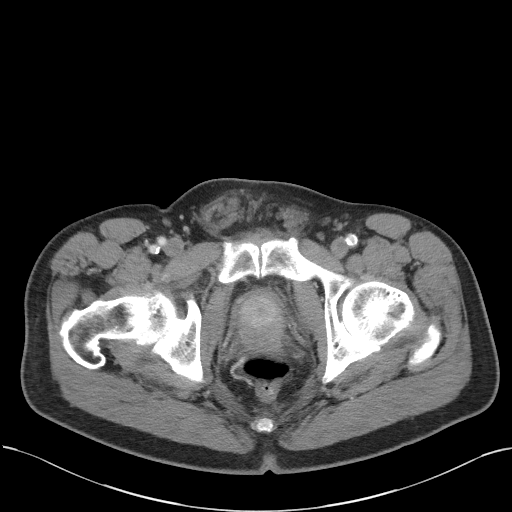
[im 39/96  soft-tissue]
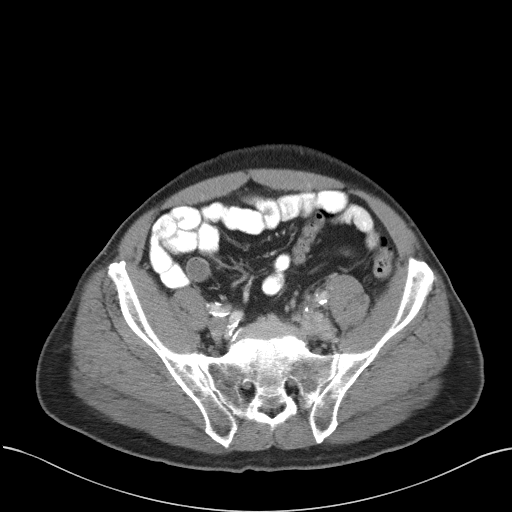
[im 58/96  soft-tissue]
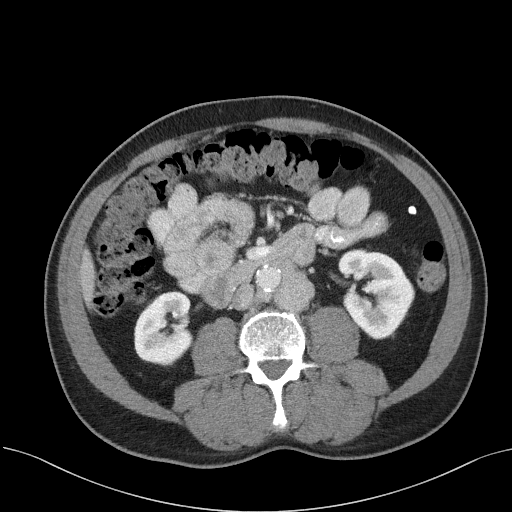
[im 77/96  soft-tissue]
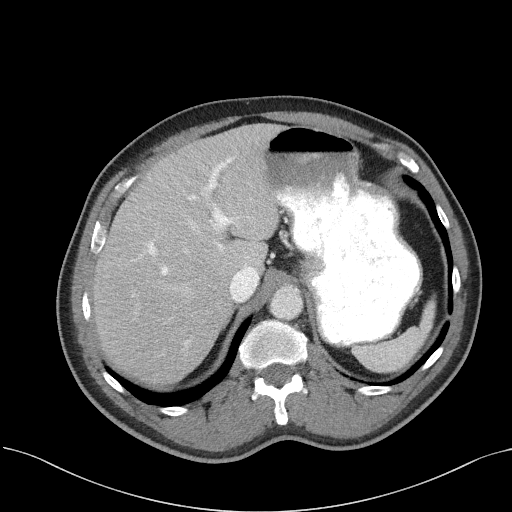

[11 of 46 positions shown; findings below may reference images not displayed]

RADIATION DOSE REDUCTION: This exam was performed according to the
departmental dose-optimization program which includes automated
exposure control, adjustment of the mA and/or kV according to
patient size and/or use of iterative reconstruction technique.

CONTRAST:  100mL OMNIPAQUE IOHEXOL 300 MG/ML  SOLN
FINDINGS: Lower Chest: No acute findings.

Hepatobiliary: No hepatic masses identified. Gallbladder is
unremarkable. No evidence of biliary ductal dilatation.

Pancreas:  No mass or inflammatory changes.

Spleen: Within normal limits in size and appearance.

Adrenals/Urinary Tract: No masses identified. No evidence of
ureteral calculi or hydronephrosis.

Stomach/Bowel: No evidence of obstruction, inflammatory process or
abnormal fluid collections. Normal appendix visualized.

Vascular/Lymphatic: No acute vascular findings. Aortic
atherosclerotic calcification incidentally noted. Pelvic
lymphadenopathy is seen throughout the left iliac chains, with
largest lymph node measuring 2.2 cm short axis on image 55/7.
Retroperitoneal lymphadenopathy is seen throughout the left
paraaortic region, with largest lymph node measuring 3.4 cm on image
34/7. Mild aortocaval lymph node measures 1.2 cm on image 37/7. Mild
left retrocrural lymphadenopathy is seen, measuring 1.2 cm on image
[DATE].

Reproductive:  Mildly enlarged prostate gland.

Other:  None.

Musculoskeletal: 1 cm sclerotic bone lesion is seen in the T12
vertebral body. A small sclerotic bone metastasis cannot be
excluded.
IMPRESSION: Mildly enlarged prostate gland.

Abdominal and pelvic lymphadenopathy, consistent with metastatic
disease.

1 cm sclerotic bone lesion in the T12 vertebral body, nonspecific.
Sclerotic bone metastasis cannot be excluded.

Aortic Atherosclerosis ([id number]-MXE.E).

## 2022-08-29 ENCOUNTER — Ambulatory Visit: Payer: Medicare Other | Admitting: Hematology

## 2022-08-29 ENCOUNTER — Other Ambulatory Visit: Payer: Medicare Other

## 2022-08-30 ENCOUNTER — Inpatient Hospital Stay: Payer: Medicare Other | Attending: Oncology

## 2022-08-30 ENCOUNTER — Inpatient Hospital Stay: Payer: Medicare Other | Admitting: Hematology

## 2022-08-30 NOTE — Progress Notes (Deleted)
[facility]   Telephone:([phone] Fax:(336) [phone]   Clinic Follow up Note   Patient Care Team: Claiborne Rigg, NP as PCP - General (Nurse Practitioner) Cherlyn Cushing, RN as Oncology Nurse Navigator Duffy, Pascal Lux, LCSW as Social Worker (Licensed Clinical Social Worker) Malachy Mood, MD as Attending Physician (Hematology and Oncology)  Date of Service:  08/30/2022  CHIEF COMPLAINT: f/u of   Prostate Cancer   CURRENT THERAPY:   Orgovyx 120 mg daily started in June 2023.   Xtandi 160 mg daily started on November 10, 2021.    ASSESSMENT: *** Tyler Pratt is a 67 y.o. male with   No problem-specific Assessment & Plan notes found for this encounter.  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) Staging form: Prostate, AJCC 8th Edition - Clinical stage from 10/16/2021: Stage IVB (cT1c, cN1, cM1b, PSA: 1523, Grade Group: 5) - Unsigned Histopathologic type: Adenocarcinoma, NOS Stage prefix: Initial diagnosis Prostate specific antigen (PSA) range: 20 or greater Gleason primary pattern: 4 Gleason secondary pattern: 5 Gleason score: 9 Histologic grading system: 5 grade system Number of biopsy cores examined: 12 Number of biopsy cores positive: 12 Location of positive needle core biopsies: Both sides     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 He was last seen by me on 07/18/2022. He presents to the clinic        All other systems were reviewed with the patient and are negative.  MEDICAL HISTORY:  Past Medical History:  Diagnosis Date   High cholesterol    Hypertension    Prostate CA [facility])     SURGICAL HISTORY: No past surgical history on  file.  I have reviewed the social history and family history with the patient and they are unchanged from previous note.  ALLERGIES:  has No Known Allergies.  MEDICATIONS:  Current Outpatient Medications  Medication Sig Dispense Refill   amLODipine (NORVASC) 10 MG tablet Take 1 tablet (10 mg total) by mouth daily. 90 tablet 1   diclofenac Sodium (VOLTAREN) 1 % GEL Apply 4 g topically 4 (four) times daily. 200 g 6   enzalutamide (XTANDI) 40 MG tablet Take 4 tablets (160mg ) by mouth once daily as directed by physician. 120 tablet 2   ezetimibe (ZETIA) 10 MG tablet Take 1 tablet (10 mg total) by mouth daily. 90 tablet 3   gabapentin (NEURONTIN) 100 MG capsule Take 1-3 capsules (100-300 mg total) by mouth at bedtime. Start a 1 cap at night for hot flushes, ok to increase to 2 caps then 3 caps in second and third week if tolerates well 60 capsule 0   losartan-hydrochlorothiazide (HYZAAR) 50-12.5 MG tablet Take 1 tablet by mouth daily. 90 tablet 0   Relugolix (ORGOVYX) 120 MG TABS Take 1 tablet (120 mg total) by mouth daily. 30 tablet 11   rosuvastatin (CRESTOR) 20 MG tablet Take 1 tablet (20 mg total) by mouth daily. 90 tablet 3   traZODone (DESYREL) 150 MG tablet Take 1 tablet (150 mg total) by mouth at bedtime. 90 tablet 1   No current facility-administered medications for this visit.    PHYSICAL EXAMINATION: ECOG PERFORMANCE STATUS: {CHL  ONC ECOG FI:[id number]}  There were no vitals filed for this visit. Wt Readings from Last 3 Encounters:  07/25/22 185 lb 12.8 oz (84.3 kg)  06/15/22 191 lb 4.8 oz (86.8 kg)  05/28/22 189 lb 12.8 oz (86.1 kg)    {Only keep what was examined. If exam not performed, can use .CEXAM } GENERAL:alert, no distress and comfortable SKIN: skin color, texture, turgor are normal, no rashes or significant lesions EYES: normal, Conjunctiva are pink and non-injected, sclera clear {OROPHARYNX:no exudate, no erythema and lips, buccal mucosa, and tongue normal}  NECK:  supple, thyroid normal size, non-tender, without nodularity LYMPH:  no palpable lymphadenopathy in the cervical, axillary {or inguinal} LUNGS: clear to auscultation and percussion with normal breathing effort HEART: regular rate & rhythm and no murmurs and no lower extremity edema ABDOMEN:abdomen soft, non-tender and normal bowel sounds Musculoskeletal:no cyanosis of digits and no clubbing  NEURO: alert & oriented x 3 with fluent speech, no focal motor/sensory deficits  LABORATORY DATA:  I have reviewed the data as listed    Latest Ref Rng & Units 05/28/2022   10:07 AM 04/04/2022    3:09 PM 12/28/2021    3:13 PM  CBC  WBC 4.0 - 10.5 K/uL 9.5  9.7  8.1   Hemoglobin 13.0 - 17.0 g/dL 16.6  06.3  01.6   Hematocrit 39.0 - 52.0 % 35.5  35.3  35.7   Platelets 150 - 400 K/uL 328  363  324         Latest Ref Rng & Units 05/28/2022   10:07 AM 04/04/2022    3:09 PM 12/28/2021    3:13 PM  CMP  Glucose 70 - 99 mg/dL 010  932  355   BUN 8 - 23 mg/dL 15  15  16    Creatinine 0.61 - 1.24 mg/dL 7.32  2.02  5.42   Sodium 135 - 145 mmol/L 139  139  138   Potassium 3.5 - 5.1 mmol/L 4.0  4.1  3.8   Chloride 98 - 111 mmol/L 104  104  102   CO2 22 - 32 mmol/L 29  27  29    Calcium 8.9 - 10.3 mg/dL 9.5  9.1  9.4   Total Protein 6.5 - 8.1 g/dL 6.9  6.6  6.6   Total Bilirubin 0.3 - 1.2 mg/dL 0.4  0.4  0.3   Alkaline Phos 38 - 126 U/L 76  71  66   AST 15 - 41 U/L 13  16  15    ALT 0 - 44 U/L 14  14  13        RADIOGRAPHIC STUDIES: I have personally reviewed the radiological images as listed and agreed with the findings in the report. No results found.    No orders of the defined types were placed in this encounter.  All questions were answered. The patient knows to call the clinic with any problems, questions or concerns. No barriers to learning was detected. The total time spent in the appointment was {CHL ONC TIME VISIT - HCWCB:[id number]}.     Salome Holmes, CMA 08/30/2022   I,  Monica Martinez, CMA, am acting as scribe for Malachy Mood, MD.   {Add scribe attestation statement}

## 2022-08-30 NOTE — Assessment & Plan Note (Deleted)
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  -staging CT showed enlarged abdominal adenopathy, consistent with metastatic disease. -He is currently on Orgovyx 120 mg daily and Xtandi 160 mg daily started in June 2023 -He has had excellent response to treatment, his PSA level has been undetectable since October 2023 -He is clinically doing very well,  and tolerates treatment well. -He will continue follow-up with his urologist Dr. McKenzie every 6 months who prescribes Orgovyx for him -I personally reviewed his recent CT CAP w contrast which was negative for PE or other acute findings, his previous adenopathy has resolved, indicating good response to therapy  

## 2022-09-03 ENCOUNTER — Telehealth: Payer: Self-pay | Admitting: Hematology

## 2022-09-12 ENCOUNTER — Other Ambulatory Visit: Payer: Self-pay

## 2022-09-17 ENCOUNTER — Other Ambulatory Visit: Payer: Self-pay | Admitting: Family Medicine

## 2022-09-17 DIAGNOSIS — I10 Essential (primary) hypertension: Secondary | ICD-10-CM

## 2022-09-18 ENCOUNTER — Other Ambulatory Visit: Payer: Self-pay

## 2022-09-18 MED ORDER — LOSARTAN POTASSIUM-HCTZ 50-12.5 MG PO TABS
1.0000 | ORAL_TABLET | Freq: Every day | ORAL | 0 refills | Status: DC
Start: 2022-09-18 — End: 2022-10-24
  Filled 2022-09-18: qty 90, 90d supply, fill #0

## 2022-09-18 NOTE — Telephone Encounter (Signed)
Requested Prescriptions  Pending Prescriptions Disp Refills   losartan-hydrochlorothiazide (HYZAAR) 50-12.5 MG tablet 90 tablet 0    Sig: Take 1 tablet by mouth daily.     Cardiovascular: ARB + Diuretic Combos Passed - 09/17/2022  8:29 AM      Passed - K in normal range and within 180 days    Potassium  Date Value Ref Range Status  05/28/2022 4.0 3.5 - 5.1 mmol/L Final         Passed - Na in normal range and within 180 days    Sodium  Date Value Ref Range Status  05/28/2022 139 135 - 145 mmol/L Final  10/25/2021 139 134 - 144 mmol/L Final         Passed - Cr in normal range and within 180 days    Creatinine  Date Value Ref Range Status  05/28/2022 0.98 0.61 - 1.24 mg/dL Final         Passed - eGFR is 10 or above and within 180 days    GFR calc Af Amer  Date Value Ref Range Status  02/16/2007   Final   >60        The eGFR has been calculated using the MDRD equation. This calculation has not been validated in all clinical   GFR, Estimated  Date Value Ref Range Status  05/28/2022 >60 >60 mL/min Final    Comment:    (NOTE) Calculated using the CKD-EPI Creatinine Equation (2021)    eGFR  Date Value Ref Range Status  10/25/2021 70 >59 mL/min/1.73 Final         Passed - Patient is not pregnant      Passed - Last BP in normal range    BP Readings from Last 1 Encounters:  07/25/22 129/71         Passed - Valid encounter within last 6 months    Recent Outpatient Visits           1 month ago Primary hypertension   [place] [facility] [place], Shea Stakes, NP   4 months ago Primary hypertension   [place] [facility] [place], Shea Stakes, NP   8 months ago Essential hypertension   [facility] [place], Cornelius Moras, RPH-CPP   10 months ago Essential hypertension   [place] [facility] [place], Shea Stakes, NP   1 year ago Essential hypertension   [employer]  [facility] [place], Shea Stakes, NP       Future Appointments             In 1 month Claiborne Rigg, NP [facility]   In 1 month McKenzie, Mardene Celeste, MD [facility] [place]

## 2022-09-20 ENCOUNTER — Inpatient Hospital Stay: Payer: Medicare Other | Attending: Hematology

## 2022-09-20 ENCOUNTER — Other Ambulatory Visit: Payer: Self-pay

## 2022-09-20 ENCOUNTER — Inpatient Hospital Stay (HOSPITAL_BASED_OUTPATIENT_CLINIC_OR_DEPARTMENT_OTHER): Payer: Medicare Other | Admitting: Hematology

## 2022-09-20 ENCOUNTER — Encounter: Payer: Self-pay | Admitting: Hematology

## 2022-09-20 VITALS — BP 145/70 | HR 81 | Temp 98.6°F | Resp 18 | Ht 70.0 in | Wt 185.3 lb

## 2022-09-20 DIAGNOSIS — C775 Secondary and unspecified malignant neoplasm of intrapelvic lymph nodes: Secondary | ICD-10-CM | POA: Insufficient documentation

## 2022-09-20 DIAGNOSIS — C61 Malignant neoplasm of prostate: Secondary | ICD-10-CM

## 2022-09-20 LAB — CMP (CANCER CENTER ONLY)
ALT: 9 U/L (ref 0–44)
AST: 11 U/L — ABNORMAL LOW (ref 15–41)
Albumin: 4.2 g/dL (ref 3.5–5.0)
Alkaline Phosphatase: 82 U/L (ref 38–126)
Anion gap: 7 (ref 5–15)
BUN: 11 mg/dL (ref 8–23)
CO2: 29 mmol/L (ref 22–32)
Calcium: 9.5 mg/dL (ref 8.9–10.3)
Chloride: 104 mmol/L (ref 98–111)
Creatinine: 1.01 mg/dL (ref 0.61–1.24)
GFR, Estimated: >60 mL/min (ref >60)
Glucose, Bld: 158 mg/dL — ABNORMAL HIGH (ref 70–99)
Potassium: 4.4 mmol/L (ref 3.5–5.1)
Sodium: 140 mmol/L (ref 135–145)
Total Bilirubin: 0.4 mg/dL (ref 0.3–1.2)
Total Protein: 6.9 g/dL (ref 6.5–8.1)

## 2022-09-20 LAB — CBC WITH DIFFERENTIAL (CANCER CENTER ONLY)
Abs Immature Granulocytes: 0.03 10*3/uL (ref 0.00–0.07)
Basophils Absolute: 0.1 10*3/uL (ref 0.0–0.1)
Basophils Relative: 1 %
Eosinophils Absolute: 0.2 10*3/uL (ref 0.0–0.5)
Eosinophils Relative: 2 %
HCT: 35.1 % — ABNORMAL LOW (ref 39.0–52.0)
Hemoglobin: 11.8 g/dL — ABNORMAL LOW (ref 13.0–17.0)
Immature Granulocytes: 0 %
Lymphocytes Relative: 29 %
Lymphs Abs: 2.7 10*3/uL (ref 0.7–4.0)
MCH: 30.6 pg (ref 26.0–34.0)
MCHC: 33.6 g/dL (ref 30.0–36.0)
MCV: 91.2 fL (ref 80.0–100.0)
Monocytes Absolute: 0.4 10*3/uL (ref 0.1–1.0)
Monocytes Relative: 4 %
Neutro Abs: 5.9 10*3/uL (ref 1.7–7.7)
Neutrophils Relative %: 64 %
Platelet Count: 338 10*3/uL (ref 150–400)
RBC: 3.85 MIL/uL — ABNORMAL LOW (ref 4.22–5.81)
RDW: 12.3 % (ref 11.5–15.5)
WBC Count: 9.3 10*3/uL (ref 4.0–10.5)
nRBC: 0 % (ref 0.0–0.2)

## 2022-09-20 NOTE — Assessment & Plan Note (Signed)
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  -staging CT showed enlarged abdominal adenopathy, consistent with metastatic disease. -He is currently on Orgovyx 120 mg daily and Xtandi 160 mg daily started in June 2023 -He has had excellent response to treatment, his PSA level has been undetectable since October 2023 -He is clinically doing very well,  and tolerates treatment well. -He will continue follow-up with his urologist Dr. Ronne Binning every 6 months who prescribes Orgovyx for him -his CT CAP w contrast on 07/05/2022 was negative for PE or other acute findings, his previous adenopathy has resolved, indicating good response to therapy

## 2022-09-20 NOTE — Progress Notes (Signed)
[facility]   Telephone:(336) [phone] Fax:(336) [phone]   Clinic Follow up Note   Patient Care Team: Claiborne Rigg, NP as PCP - General (Nurse Practitioner) Cherlyn Cushing, RN as Oncology Nurse Navigator Duffy, Pascal Lux, LCSW as Social Worker (Licensed Clinical Social Worker) Malachy Mood, MD as Attending Physician (Hematology and Oncology)  Date of Service:  09/20/2022  CHIEF COMPLAINT: f/u of  Prostate Cancer   CURRENT THERAPY:  Orgovyx 120 mg daily started in June 2023.   Xtandi 160 mg daily started on November 10, 2021.    ASSESSMENT:  Tyler Pratt is a 67 y.o. 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  -staging CT showed enlarged abdominal adenopathy, consistent with metastatic disease. -He is currently on Orgovyx 120 mg daily and Xtandi 160 mg daily started in June 2023 -He has had excellent response to treatment, his PSA level has been undetectable since October 2023 -He is clinically doing very well,  and tolerates treatment well. -He will continue follow-up with his urologist Dr. Ronne Binning every 6 months who prescribes Orgovyx for him -his CT CAP w contrast on 07/05/2022 was negative for PE or other acute findings, his previous adenopathy has resolved, indicating good response to therapy  -His left-sided chest and upper abdominal pain has resolved.  Clinically doing well except hot flashes, will continue current treatment.   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) Staging form: Prostate, AJCC 8th Edition - Clinical  stage from 10/16/2021: Stage IVB (cT1c, cN1, cM1b, PSA: 1523, Grade Group: 5) - Unsigned Histopathologic type: Adenocarcinoma, NOS Stage prefix: Initial diagnosis Prostate specific antigen (PSA) range: 20 or greater Gleason primary pattern: 4 Gleason secondary pattern: 5 Gleason score: 9 Histologic grading system: 5 grade system Number of biopsy cores examined: 12 Number of biopsy cores positive: 12 Location of positive needle core biopsies: Both sides     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 He was last seen by me on 07/18/2022. He presents to the clinic alone. Pt state that he is having hot flashes and it effecting his sleep. Pt state that he has intermittent pain that was on  the side of his abdomen.    All other systems were reviewed with the patient and are negative.  MEDICAL HISTORY:  Past Medical History:  Diagnosis Date   High cholesterol    Hypertension    Prostate CA [facility])     SURGICAL HISTORY: History reviewed. No pertinent surgical history.  I have reviewed the social history and family history with the patient and they are unchanged from previous note.  ALLERGIES:  has No Known Allergies.  MEDICATIONS:  Current Outpatient Medications  Medication Sig Dispense Refill   amLODipine (NORVASC) 10 MG tablet Take 1 tablet (10 mg total) by mouth daily. 90 tablet 1   diclofenac Sodium (VOLTAREN) 1 % GEL Apply 4 g  topically 4 (four) times daily. 200 g 6   enzalutamide (XTANDI) 40 MG tablet Take 4 tablets (160mg ) by mouth once daily as directed by physician. 120 tablet 2   ezetimibe (ZETIA) 10 MG tablet Take 1 tablet (10 mg total) by mouth daily. 90 tablet 3   losartan-hydrochlorothiazide (HYZAAR) 50-12.5 MG tablet Take 1 tablet by mouth daily. 90 tablet 0   Relugolix (ORGOVYX)  120 MG TABS Take 1 tablet (120 mg total) by mouth daily. 30 tablet 11   rosuvastatin (CRESTOR) 20 MG tablet Take 1 tablet (20 mg total) by mouth daily. 90 tablet 3   traZODone (DESYREL) 150 MG tablet Take 1 tablet (150 mg total) by mouth at bedtime. 90 tablet 1   No current facility-administered medications for this visit.    PHYSICAL EXAMINATION: ECOG PERFORMANCE STATUS: 0 - Asymptomatic  Vitals:   09/20/22 1343  BP: (!) 145/70  Pulse: 81  Resp: 18  Temp: 98.6 F (37 C)  SpO2: 100%   Wt Readings from Last 3 Encounters:  09/20/22 185 lb 4.8 oz (84.1 kg)  07/25/22 185 lb 12.8 oz (84.3 kg)  06/15/22 191 lb 4.8 oz (86.8 kg)     GENERAL:alert, no distress and comfortable SKIN: skin color normal, no rashes or significant lesions EYES: normal, Conjunctiva are pink and non-injected, sclera clear  NEURO: alert & oriented x 3 with fluent speech   LABORATORY DATA:  I have reviewed the data as listed    Latest Ref Rng & Units 09/20/2022   12:56 PM 05/28/2022   10:07 AM 04/04/2022    3:09 PM  CBC  WBC 4.0 - 10.5 K/uL 9.3  9.5  9.7   Hemoglobin 13.0 - 17.0 g/dL 81.1  91.4  78.2   Hematocrit 39.0 - 52.0 % 35.1  35.5  35.3   Platelets 150 - 400 K/uL 338  328  363         Latest Ref Rng & Units 09/20/2022   12:56 PM 05/28/2022   10:07 AM 04/04/2022    3:09 PM  CMP  Glucose 70 - 99 mg/dL 956  213  086   BUN 8 - 23 mg/dL 11  15  15    Creatinine 0.61 - 1.24 mg/dL 5.78  4.69  6.29   Sodium 135 - 145 mmol/L 140  139  139   Potassium 3.5 - 5.1 mmol/L 4.4  4.0  4.1   Chloride 98 - 111 mmol/L 104  104  104   CO2 22 - 32 mmol/L 29  29  27    Calcium 8.9 - 10.3 mg/dL 9.5  9.5  9.1   Total Protein 6.5 - 8.1 g/dL 6.9  6.9  6.6   Total Bilirubin 0.3 - 1.2 mg/dL 0.4  0.4  0.4   Alkaline Phos 38 - 126 U/L 82  76  71   AST 15 - 41 U/L 11  13  16    ALT 0 - 44 U/L 9  14  14        RADIOGRAPHIC STUDIES: I have personally reviewed the radiological images as listed and agreed with the  findings in the report. No results found.    No orders of the defined types were placed in this encounter.  All questions were answered. The patient knows to call the clinic with any problems, questions or concerns. No barriers to learning was detected. The total time spent in the appointment was 20 minutes.     Malachy Mood, MD 09/20/2022   I,  Monica Martinez, CMA, am acting as scribe for Malachy Mood, MD.   I have reviewed the above documentation for accuracy and completeness, and I agree with the above.

## 2022-09-22 LAB — PROSTATE-SPECIFIC AG, SERUM (LABCORP): Prostate Specific Ag, Serum: <0.1 ng/mL (ref 0.0–4.0)

## 2022-10-05 ENCOUNTER — Other Ambulatory Visit: Payer: Self-pay | Admitting: Family Medicine

## 2022-10-05 ENCOUNTER — Other Ambulatory Visit: Payer: Self-pay

## 2022-10-05 DIAGNOSIS — G4700 Insomnia, unspecified: Secondary | ICD-10-CM

## 2022-10-05 MED ORDER — TRAZODONE HCL 150 MG PO TABS
150.0000 mg | ORAL_TABLET | Freq: Every day | ORAL | 0 refills | Status: DC
Start: 2022-10-05 — End: 2022-10-11
  Filled 2022-10-05: qty 90, 90d supply, fill #0

## 2022-10-05 NOTE — Telephone Encounter (Signed)
Requested Prescriptions  Pending Prescriptions Disp Refills   traZODone (DESYREL) 150 MG tablet 90 tablet 1    Sig: Take 1 tablet (150 mg total) by mouth at bedtime.     Psychiatry: Antidepressants - Serotonin Modulator Passed - 10/05/2022 11:56 AM      Passed - Valid encounter within last 6 months    Recent Outpatient Visits           2 months ago Primary hypertension   [place] [facility] [place], Shea Stakes, NP   5 months ago Primary hypertension   [place] [facility] [place], Shea Stakes, NP   9 months ago Essential hypertension   [facility] [place], Cornelius Moras, RPH-CPP   11 months ago Essential hypertension   [facility] [place], Shea Stakes, NP   1 year ago Essential hypertension   [place] Claiborne Rigg, NP       Future Appointments             In 2 weeks Claiborne Rigg, NP [facility]   In 1 month McKenzie, Mardene Celeste, MD [facility] [place]

## 2022-10-08 ENCOUNTER — Other Ambulatory Visit: Payer: Self-pay

## 2022-10-11 ENCOUNTER — Other Ambulatory Visit: Payer: Self-pay

## 2022-10-11 ENCOUNTER — Other Ambulatory Visit: Payer: Self-pay | Admitting: Nurse Practitioner

## 2022-10-11 DIAGNOSIS — G4700 Insomnia, unspecified: Secondary | ICD-10-CM

## 2022-10-11 NOTE — Telephone Encounter (Signed)
Medication Refill - Medication: traZODone (DESYREL) 150 MG tablet   Pt stated he increased his dose of medication, taking an extra half of the pill to be able to sleep. As stated by Ms. Fleming in the past, he could increase the dose because he couldn't sleep.  Initially pt stated he was told a refill was too early to pick up, and then he stated he was told a refill was not approved. Stated he is out of the medication, and he really needs it.   Unable to speak with the pharmacy for clarification no answer.   Please advise.  Has the patient contacted their pharmacy? Yes.    (Agent: If yes, when and what did the pharmacy advise?)  Preferred Pharmacy (with phone number or street name):  Has the patient been seen for an appointment in the last year OR does the patient have an upcoming appointment? Yes.    Agent: Please be advised that RX refills may take up to 3 business days. We ask that you follow-up with your pharmacy.

## 2022-10-12 ENCOUNTER — Other Ambulatory Visit: Payer: Self-pay

## 2022-10-12 MED ORDER — TRAZODONE HCL 150 MG PO TABS
150.0000 mg | ORAL_TABLET | Freq: Every day | ORAL | 0 refills | Status: DC
Start: 2022-10-12 — End: 2022-10-24
  Filled 2022-10-12: qty 90, 90d supply, fill #0

## 2022-10-12 NOTE — Telephone Encounter (Signed)
Requested medications are due for refill today.  unsure  Requested medications are on the active medications list.  yes  Last refill. 10/05/2022 #90 1OX  Future visit scheduled.   yes  Notes to clinic.  Please see note below.    Requested Prescriptions  Pending Prescriptions Disp Refills   traZODone (DESYREL) 150 MG tablet 90 tablet 0    Sig: Take 1 tablet (150 mg total) by mouth at bedtime.     Psychiatry: Antidepressants - Serotonin Modulator Passed - 10/11/2022 11:20 AM      Passed - Valid encounter within last 6 months    Recent Outpatient Visits           2 months ago Primary hypertension   [place] [facility] [place], Shea Stakes, NP   5 months ago Primary hypertension   [place] [facility] [place], Shea Stakes, NP   9 months ago Essential hypertension   [facility] [place], Cornelius Moras, RPH-CPP   11 months ago Essential hypertension   [place] [facility] [place], Shea Stakes, NP   1 year ago Essential hypertension   [place] [facility] Claiborne Rigg, NP       Future Appointments             In 1 week Claiborne Rigg, NP [facility]   In 3 weeks McKenzie, Mardene Celeste, MD [facility] [place]

## 2022-10-24 ENCOUNTER — Ambulatory Visit: Payer: Medicare Other | Attending: Nurse Practitioner | Admitting: Nurse Practitioner

## 2022-10-24 ENCOUNTER — Encounter: Payer: Self-pay | Admitting: Nurse Practitioner

## 2022-10-24 ENCOUNTER — Other Ambulatory Visit: Payer: Self-pay

## 2022-10-24 VITALS — BP 126/69 | HR 80 | Ht 70.0 in | Wt 186.0 lb

## 2022-10-24 DIAGNOSIS — Z79899 Other long term (current) drug therapy: Secondary | ICD-10-CM | POA: Diagnosis not present

## 2022-10-24 DIAGNOSIS — M65311 Trigger thumb, right thumb: Secondary | ICD-10-CM

## 2022-10-24 DIAGNOSIS — I10 Essential (primary) hypertension: Secondary | ICD-10-CM | POA: Diagnosis present

## 2022-10-24 DIAGNOSIS — M65312 Trigger thumb, left thumb: Secondary | ICD-10-CM

## 2022-10-24 DIAGNOSIS — G4700 Insomnia, unspecified: Secondary | ICD-10-CM

## 2022-10-24 MED ORDER — TRAZODONE HCL 300 MG PO TABS
300.0000 mg | ORAL_TABLET | Freq: Every day | ORAL | 1 refills | Status: DC
Start: 2022-10-24 — End: 2023-04-30
  Filled 2022-10-24 – 2022-12-20 (×2): qty 30, 30d supply, fill #0
  Filled 2023-01-15: qty 30, 30d supply, fill #1
  Filled 2023-02-20: qty 30, 30d supply, fill #2
  Filled 2023-04-03: qty 30, 30d supply, fill #3

## 2022-10-24 MED ORDER — AMLODIPINE BESYLATE 10 MG PO TABS
10.0000 mg | ORAL_TABLET | Freq: Every day | ORAL | 1 refills | Status: DC
Start: 2022-10-24 — End: 2023-04-30
  Filled 2022-10-24 – 2023-01-02 (×3): qty 90, 90d supply, fill #0

## 2022-10-24 MED ORDER — LOSARTAN POTASSIUM-HCTZ 50-12.5 MG PO TABS
1.0000 | ORAL_TABLET | Freq: Every day | ORAL | 1 refills | Status: DC
Start: 2022-10-24 — End: 2023-04-30
  Filled 2022-10-24 – 2023-01-02 (×3): qty 90, 90d supply, fill #0

## 2022-10-24 NOTE — Progress Notes (Signed)
Assessment & Plan:  Tyler Pratt was seen today for hypertension.  Diagnoses and all orders for this visit:  Essential hypertension -     amLODipine (NORVASC) 10 MG tablet; Take 1 tablet (10 mg total) by mouth daily. -     losartan-hydrochlorothiazide (HYZAAR) 50-12.5 MG tablet; Take 1 tablet by mouth daily. Continue all antihypertensives as prescribed.  Reminded to bring in blood pressure log for follow  up appointment.  RECOMMENDATIONS: DASH/Mediterranean Diets are healthier choices for HTN.    Insomnia, unspecified type -     trazodone (DESYREL) 300 MG tablet; Take 1 tablet (300 mg total) by mouth at bedtime. May take with melatonin if needed for sleep.    Bilateral trigger thumb Conservative home exercises at this time Will need hand surgery referral if no improvement   Patient has been counseled on age-appropriate routine health concerns for screening and prevention. These are reviewed and up-to-date. Referrals have been placed accordingly. Immunizations are up-to-date or declined.    Subjective:   Chief Complaint  Patient presents with   Hypertension    Tyler Pratt 67 y.o. male presents to office today for follow up to HTN  PMH: prostate Ca (followed by Oncology), HTN, prediabetes, leukcytosis  Currently being followed by Oncology and Urolgy for malignant neoplasm of prostate. PSA level has been undetectable since October 2023 on Orgovyx 120 mg daily and Xtandi 160 mg daily      HTN Blood pressure is well controlled today. He is taking amlodipine 10 mg daily and hyzaar 50-12.5 mg daily as prescribed. BP Readings from Last 3 Encounters:  10/24/22 126/69  09/20/22 (!) 145/70  07/25/22 129/71     Trigger Finger: Patient complaints of bilateral Thumb MCP  dislocation catching. This has been present several weeks however recently has become progressively more aggravating. This affects most every activity involving gripping. Originally only a catching sensation was  present, however now locking is present. He presents today for evaluation  Treatment to date has been none   Review of Systems  Constitutional:  Negative for fever, malaise/fatigue and weight loss.  HENT: Negative.  Negative for nosebleeds.   Eyes: Negative.  Negative for blurred vision, double vision and photophobia.  Respiratory: Negative.  Negative for cough and shortness of breath.   Cardiovascular: Negative.  Negative for chest pain, palpitations and leg swelling.  Gastrointestinal: Negative.  Negative for heartburn, nausea and vomiting.  Musculoskeletal:  Positive for joint pain (see hpi). Negative for myalgias.  Neurological: Negative.  Negative for dizziness, focal weakness, seizures and headaches.  Psychiatric/Behavioral:  Negative for suicidal ideas. The patient has insomnia.     Past Medical History:  Diagnosis Date   High cholesterol    Hypertension    Prostate CA (HCC)     No past surgical history on file.  Family History  Problem Relation Age of Onset   Hypertension Mother    Cancer Mother    Hypertension Father    Cancer Father    Diabetes Maternal Grandmother    Heart disease Paternal Grandfather     Social History Reviewed with no changes to be made today.   Outpatient Medications Prior to Visit  Medication Sig Dispense Refill   diclofenac Sodium (VOLTAREN) 1 % GEL Apply 4 g topically 4 (four) times daily. 200 g 6   enzalutamide (XTANDI) 40 MG tablet Take 4 tablets (160mg ) by mouth once daily as directed by physician. 120 tablet 2   ezetimibe (ZETIA) 10 MG tablet Take 1 tablet (  10 mg total) by mouth daily. 90 tablet 3   Relugolix (ORGOVYX) 120 MG TABS Take 1 tablet (120 mg total) by mouth daily. 30 tablet 11   rosuvastatin (CRESTOR) 20 MG tablet Take 1 tablet (20 mg total) by mouth daily. 90 tablet 3   amLODipine (NORVASC) 10 MG tablet Take 1 tablet (10 mg total) by mouth daily. 90 tablet 1   losartan-hydrochlorothiazide (HYZAAR) 50-12.5 MG tablet Take 1  tablet by mouth daily. 90 tablet 0   traZODone (DESYREL) 150 MG tablet Take 1 tablet (150 mg total) by mouth at bedtime. 90 tablet 0   No facility-administered medications prior to visit.    No Known Allergies     Objective:    BP 126/69 (BP Location: Left Arm, Patient Position: Sitting, Cuff Size: Normal)   Pulse 80   Ht 5\' 10"  (1.778 m)   Wt 186 lb (84.4 kg)   SpO2 99%   BMI 26.69 kg/m  Wt Readings from Last 3 Encounters:  10/24/22 186 lb (84.4 kg)  09/20/22 185 lb 4.8 oz (84.1 kg)  07/25/22 185 lb 12.8 oz (84.3 kg)    Physical Exam Vitals and nursing note reviewed.  Constitutional:      Appearance: He is well-developed.  HENT:     Head: Normocephalic and atraumatic.  Cardiovascular:     Rate and Rhythm: Normal rate and regular rhythm.     Heart sounds: Normal heart sounds. No murmur heard.    No friction rub. No gallop.  Pulmonary:     Effort: Pulmonary effort is normal. No tachypnea or respiratory distress.     Breath sounds: Normal breath sounds. No decreased breath sounds, wheezing, rhonchi or rales.  Chest:     Chest wall: No tenderness.  Abdominal:     General: Bowel sounds are normal.     Palpations: Abdomen is soft.  Musculoskeletal:     Right hand: Deformity (thumb) present. Decreased range of motion.     Left hand: Deformity (thumb) present. Decreased range of motion.     Cervical back: Normal range of motion.  Skin:    General: Skin is warm and dry.  Neurological:     Mental Status: He is alert and oriented to person, place, and time.     Coordination: Coordination normal.  Psychiatric:        Behavior: Behavior normal. Behavior is cooperative.        Thought Content: Thought content normal.        Judgment: Judgment normal.          Patient has been counseled extensively about nutrition and exercise as well as the importance of adherence with medications and regular follow-up. The patient was given clear instructions to go to ER or return to  medical center if symptoms don't improve, worsen or new problems develop. The patient verbalized understanding.   Follow-up: Return in about 3 months (around 01/24/2023) for physical.   Claiborne Rigg, FNP-BC [facility] [place], [place] [phone]   10/24/2022, 3:43 PM

## 2022-10-24 NOTE — Patient Instructions (Addendum)
Placed in [facility] [address] [place], [place] [address] PH# [phone]   You can youtube trigger thumb exercises on you tube. You will likely need a tennis ball

## 2022-10-31 ENCOUNTER — Other Ambulatory Visit: Payer: Medicare Other

## 2022-10-31 ENCOUNTER — Other Ambulatory Visit: Payer: Self-pay

## 2022-11-07 ENCOUNTER — Ambulatory Visit: Payer: Medicare Other | Admitting: Urology

## 2022-11-07 DIAGNOSIS — C61 Malignant neoplasm of prostate: Secondary | ICD-10-CM

## 2022-11-19 ENCOUNTER — Other Ambulatory Visit: Payer: Self-pay

## 2022-11-26 ENCOUNTER — Other Ambulatory Visit: Payer: Self-pay

## 2022-12-19 ENCOUNTER — Other Ambulatory Visit: Payer: Self-pay

## 2022-12-19 ENCOUNTER — Telehealth: Payer: Self-pay

## 2022-12-19 NOTE — Telephone Encounter (Signed)
Pt called stating that he current no longer have transportation to his doctor's appts and would like to be setup for hospital transportation.  Asked pt has he inquired with his insurance to see if that is a service that they offer.  Pt stated "NO".  Informed pt that this nurse will send the [facility] Transportation Coordinator an email regarding the pt's request for hospital transportation setup.  Stated Tyler Pratt will contact the pt with the transportation options available to the pt.  Pt verbalized understanding.  Sent an email to [facility] Transportation Coordinator regarding pt's request.  Awaiting [name]'s response.

## 2022-12-20 ENCOUNTER — Other Ambulatory Visit: Payer: Self-pay

## 2022-12-24 ENCOUNTER — Emergency Department (HOSPITAL_COMMUNITY)
Admission: EM | Admit: 2022-12-24 | Discharge: 2022-12-24 | Disposition: A | Discharge status: Home / Self Care | Payer: Medicare Other | Attending: Emergency Medicine | Admitting: Emergency Medicine

## 2022-12-24 ENCOUNTER — Telehealth: Payer: Self-pay

## 2022-12-24 ENCOUNTER — Other Ambulatory Visit: Payer: Self-pay

## 2022-12-24 ENCOUNTER — Encounter (HOSPITAL_COMMUNITY): Payer: Self-pay

## 2022-12-24 ENCOUNTER — Emergency Department (HOSPITAL_COMMUNITY): Payer: Medicare Other

## 2022-12-24 DIAGNOSIS — I7102 Dissection of abdominal aorta: Secondary | ICD-10-CM

## 2022-12-24 DIAGNOSIS — D72829 Elevated white blood cell count, unspecified: Secondary | ICD-10-CM | POA: Insufficient documentation

## 2022-12-24 DIAGNOSIS — N492 Inflammatory disorders of scrotum: Secondary | ICD-10-CM | POA: Diagnosis present

## 2022-12-24 DIAGNOSIS — I10 Essential (primary) hypertension: Secondary | ICD-10-CM | POA: Insufficient documentation

## 2022-12-24 DIAGNOSIS — C61 Malignant neoplasm of prostate: Secondary | ICD-10-CM | POA: Diagnosis not present

## 2022-12-24 DIAGNOSIS — Z79899 Other long term (current) drug therapy: Secondary | ICD-10-CM | POA: Insufficient documentation

## 2022-12-24 DIAGNOSIS — L0390 Cellulitis, unspecified: Secondary | ICD-10-CM

## 2022-12-24 LAB — BASIC METABOLIC PANEL
Anion gap: 11 (ref 5–15)
BUN: 16 mg/dL (ref 8–23)
CO2: 25 mmol/L (ref 22–32)
Calcium: 8.9 mg/dL (ref 8.9–10.3)
Chloride: 99 mmol/L (ref 98–111)
Creatinine, Ser: 0.86 mg/dL (ref 0.61–1.24)
GFR, Estimated: >60 mL/min (ref >60)
Glucose, Bld: 123 mg/dL — ABNORMAL HIGH (ref 70–99)
Potassium: 3.8 mmol/L (ref 3.5–5.1)
Sodium: 135 mmol/L (ref 135–145)

## 2022-12-24 LAB — CBC WITH DIFFERENTIAL/PLATELET
Abs Immature Granulocytes: 0.05 10*3/uL (ref 0.00–0.07)
Basophils Absolute: 0.1 10*3/uL (ref 0.0–0.1)
Basophils Relative: 1 %
Eosinophils Absolute: 0.1 10*3/uL (ref 0.0–0.5)
Eosinophils Relative: 1 %
HCT: 34.1 % — ABNORMAL LOW (ref 39.0–52.0)
Hemoglobin: 11.2 g/dL — ABNORMAL LOW (ref 13.0–17.0)
Immature Granulocytes: 0 %
Lymphocytes Relative: 16 %
Lymphs Abs: 2.1 10*3/uL (ref 0.7–4.0)
MCH: 30.1 pg (ref 26.0–34.0)
MCHC: 32.8 g/dL (ref 30.0–36.0)
MCV: 91.7 fL (ref 80.0–100.0)
Monocytes Absolute: 0.9 10*3/uL (ref 0.1–1.0)
Monocytes Relative: 7 %
Neutro Abs: 9.9 10*3/uL — ABNORMAL HIGH (ref 1.7–7.7)
Neutrophils Relative %: 75 %
Platelets: 378 10*3/uL (ref 150–400)
RBC: 3.72 MIL/uL — ABNORMAL LOW (ref 4.22–5.81)
RDW: 12.4 % (ref 11.5–15.5)
WBC: 13.1 10*3/uL — ABNORMAL HIGH (ref 4.0–10.5)
nRBC: 0 % (ref 0.0–0.2)

## 2022-12-24 MED ORDER — SULFAMETHOXAZOLE-TRIMETHOPRIM 800-160 MG PO TABS
1.0000 | ORAL_TABLET | Freq: Once | ORAL | Status: AC
  Administered 2022-12-24: 1 via ORAL
  Filled 2022-12-24: qty 1

## 2022-12-24 MED ORDER — SULFAMETHOXAZOLE-TRIMETHOPRIM 800-160 MG PO TABS
1.0000 | ORAL_TABLET | Freq: Two times a day (BID) | ORAL | 0 refills | Status: AC
  Filled 2022-12-24: qty 20, 10d supply, fill #0

## 2022-12-24 MED ORDER — IOHEXOL 300 MG/ML  SOLN
100.0000 mL | Freq: Once | INTRAMUSCULAR | Status: AC | PRN
  Administered 2022-12-24: 100 mL via INTRAVENOUS

## 2022-12-24 NOTE — ED Triage Notes (Signed)
Patient has had testicular swelling for a week. Has prostate cancer and is taking chemo pills. Denies burning urination or blood in urine.

## 2022-12-24 NOTE — ED Provider Notes (Signed)
  Physical Exam  BP (!) 148/80   Pulse 90   Temp 98.9 F (37.2 C) (Oral)   Resp 18   Ht 5\' 10"  (1.778 m)   Wt 83.9 kg   SpO2 100%   BMI 26.54 kg/m   Physical Exam  Procedures  Procedures  ED Course / MDM    Medical Decision Making Amount and/or Complexity of Data Reviewed Labs: ordered. Radiology: ordered.  Risk Prescription drug management.   ***

## 2022-12-24 NOTE — Telephone Encounter (Signed)
Pt called to inform Dr. Latanya Maudlin office that he was going to the ED this morning d/t he's having swelling in his scrotum x 1.5wks, bleeding x1 day, bilateral lower edema w/lft foot being the extreme w/pain, and stated his skin around the testicles is broke open & his testicles are coming through the skin.  Pt stated he's coming to [facility] ED and just want to make Dr. Mosetta Putt aware of what's going on with him.  Stated this nurse will make Dr. Mosetta Putt aware of the pt's call and current status.

## 2022-12-24 NOTE — ED Provider Notes (Signed)
[place] EMERGENCY DEPARTMENT AT [facility] Provider Note   CSN: [id number] Arrival date & time: 12/24/22  1305     History  Chief Complaint  Patient presents with   Groin Swelling    Tyler Pratt is a 67 y.o. male.  Patient is a 67 year old male with a history of prostate cancer on oral chemotherapy not ever having any surgery of the area, hypertension and hyperlipidemia who is presenting today with 1 week history of worsening scrotal pain, swelling and now drainage.  He reports it is diffusely through his scrotum does not appear to just affect 1 side.  He reports his legs have been feeling tingly him numb as well but is not aware of having any fever.  He denies any nausea or vomiting.  He denies history of similar symptoms.  He denies any radiation to the area.  He is able to urinate normally and does complain of pain in his perineum but no pain in his abdomen.  Bowel movements have been normal.  The history is provided by the patient.       Home Medications Prior to Admission medications   Medication Sig Start Date End Date Taking? Authorizing Provider  amLODipine (NORVASC) 10 MG tablet Take 1 tablet (10 mg total) by mouth daily. 10/24/22   Claiborne Rigg, NP  diclofenac Sodium (VOLTAREN) 1 % GEL Apply 4 g topically 4 (four) times daily. 07/25/22   Claiborne Rigg, NP  enzalutamide [name] 40 MG tablet Take 4 tablets (160mg ) by mouth once daily as directed by physician. 08/20/22   Malachy Mood, MD  ezetimibe (ZETIA) 10 MG tablet Take 1 tablet (10 mg total) by mouth daily. 02/26/22   Claiborne Rigg, NP  losartan-hydrochlorothiazide (HYZAAR) 50-12.5 MG tablet Take 1 tablet by mouth daily. 10/24/22   Claiborne Rigg, NP  Relugolix (ORGOVYX) 120 MG TABS Take 1 tablet (120 mg total) by mouth daily. 05/09/22   McKenzie, Mardene Celeste, MD  rosuvastatin (CRESTOR) 20 MG tablet Take 1 tablet (20 mg total) by mouth daily. 04/26/22   Claiborne Rigg, NP  trazodone (DESYREL) 300 MG  tablet Take 1 tablet (300 mg total) by mouth at bedtime. 10/24/22   Claiborne Rigg, NP      Allergies    Patient has no known allergies.    Review of Systems   Review of Systems  Physical Exam Updated Vital Signs BP (!) 148/80   Pulse 90   Temp 98.9 F (37.2 C) (Oral)   Resp 18   Ht 5\' 10"  (1.778 m)   Wt 83.9 kg   SpO2 100%   BMI 26.54 kg/m  Physical Exam Vitals and nursing note reviewed. Exam conducted with a chaperone present.  Constitutional:      General: He is not in acute distress.    Appearance: He is well-developed.  HENT:     Head: Normocephalic and atraumatic.  Eyes:     Conjunctiva/sclera: Conjunctivae normal.     Pupils: Pupils are equal, round, and reactive to light.  Cardiovascular:     Rate and Rhythm: Normal rate and regular rhythm.     Heart sounds: No murmur heard. Pulmonary:     Effort: Pulmonary effort is normal. No respiratory distress.     Breath sounds: Normal breath sounds. No wheezing or rales.  Abdominal:     General: There is no distension.     Palpations: Abdomen is soft.     Tenderness: There is no abdominal  tenderness. There is no guarding or rebound.  Genitourinary:    Penis: Circumcised.      Comments: Scrotum has multiple areas of fluctuance, induration and 3 areas with pointing and some pus drainage.  No significant perineal induration or swelling.  No evidence of Fournier's.  1 small pus filled vesicle on the right medial thigh but no streaking or other areas of cellulitis.  Testicles are nontender and mobile seems to be related to the scrotum Musculoskeletal:        General: No tenderness. Normal range of motion.     Cervical back: Normal range of motion and neck supple.  Skin:    General: Skin is warm and dry.     Findings: No erythema or rash.  Neurological:     Mental Status: He is alert and oriented to person, place, and time. Mental status is at baseline.  Psychiatric:        Behavior: Behavior normal.     ED Results /  Procedures / Treatments   Labs (all labs ordered are listed, but only abnormal results are displayed) Labs Reviewed  CBC WITH DIFFERENTIAL/PLATELET - Abnormal; Notable for the following components:      Result Value   WBC 13.1 (*)    RBC 3.72 (*)    Hemoglobin 11.2 (*)    HCT 34.1 (*)    Neutro Abs 9.9 (*)    All other components within normal limits  BASIC METABOLIC PANEL - Abnormal; Notable for the following components:   Glucose, Bld 123 (*)    All other components within normal limits    EKG None  Radiology No results found.  Procedures Procedures    Medications Ordered in ED Medications  iohexol (OMNIPAQUE) 300 MG/ML solution 100 mL (100 mLs Intravenous Contrast Given 12/24/22 1602)    ED Course/ Medical Decision Making/ A&P                                 Medical Decision Making Amount and/or Complexity of Data Reviewed Labs: ordered. Decision-making details documented in ED Course. Radiology: ordered.  Risk Prescription drug management.   Pt with multiple medical problems and comorbidities and presenting today with a complaint that caries a high risk for morbidity and mortality.  Patient presenting today with concern for multiple abscesses of his scrotum.  No evidence of Fournier's at this time.  He also has a small blister with purulence inside it on his leg as well but no other signs of cellulitis to the extremities.  He has minimal systemic symptoms.  Symptoms of been worsening over a week.  He denies any urinary symptoms but does have some perennial pain.  Urine and bowel movements have been normal.  Patient does have a history of prostate cancer and is on chemotherapy.  He denies any tightly fitting close rubbing or trauma to the area.  Patient has abscesses of this area as even with palpation able to express pus.  Concern for possible deeper area of infection or complication of his cancer.  Labs and CT pending.  Patient will need antibiotics.  Vital signs are  stable at this time and no findings concerning for sepsis.  4:40 PM I independently interpreted patient's labs and CBC today with a leukocytosis of 13 but otherwise normal and CMP within normal limits.  I have independently visualized and interpreted pt's images today.  CT did show evidence of fluid collection in the  scrotum but seems to be around the area that is already spontaneously draining.  At this time do not feel that patient needs I&D as he is currently draining and will start antibiotics given the multiple areas.  Will have him follow-up with his urologist return for worsening symptoms.          Final Clinical Impression(s) / ED Diagnoses Final diagnoses:  None    Rx / DC Orders ED Discharge Orders     None         Gwyneth Sprout, MD 12/24/22 1707

## 2022-12-25 ENCOUNTER — Other Ambulatory Visit: Payer: Self-pay

## 2022-12-25 ENCOUNTER — Telehealth: Payer: Self-pay | Admitting: Vascular Surgery

## 2022-12-25 NOTE — Telephone Encounter (Signed)
-----   Message from Cephus Shelling sent at 12/24/2022  7:04 PM EDT ----- Can you arrange new evaluation for chronic aortic dissection with next available provider?  Thanks,  Thayer Ohm

## 2022-12-27 ENCOUNTER — Inpatient Hospital Stay: Payer: Medicare Other | Attending: Hematology | Admitting: Licensed Clinical Social Worker

## 2022-12-27 DIAGNOSIS — C775 Secondary and unspecified malignant neoplasm of intrapelvic lymph nodes: Secondary | ICD-10-CM | POA: Insufficient documentation

## 2022-12-27 DIAGNOSIS — M25572 Pain in left ankle and joints of left foot: Secondary | ICD-10-CM | POA: Insufficient documentation

## 2022-12-27 DIAGNOSIS — Z79899 Other long term (current) drug therapy: Secondary | ICD-10-CM | POA: Insufficient documentation

## 2022-12-27 DIAGNOSIS — M25571 Pain in right ankle and joints of right foot: Secondary | ICD-10-CM | POA: Insufficient documentation

## 2022-12-27 DIAGNOSIS — R262 Difficulty in walking, not elsewhere classified: Secondary | ICD-10-CM | POA: Insufficient documentation

## 2022-12-27 DIAGNOSIS — C61 Malignant neoplasm of prostate: Secondary | ICD-10-CM | POA: Insufficient documentation

## 2022-12-27 DIAGNOSIS — G629 Polyneuropathy, unspecified: Secondary | ICD-10-CM | POA: Insufficient documentation

## 2022-12-27 NOTE — Progress Notes (Unsigned)
Office Note     CC:  Chronic infrarenal dissection  Requesting Provider:  Claiborne Rigg, NP  HPI: Tyler Pratt is a 67 y.o. ([birth year]) male presenting at the request of .Tyler Rigg, NP after recent incidental CT finding of chronic infrarenal aortic dissection.   On exam, Tyler Pratt was doing well.  A native of [place], he moved down to [place] 27 years ago after meeting his wife.  Now retired, he enjoys traveling, reading, and playing ping-pong.  The pt is *** on a statin for cholesterol management.  The pt is *** on a daily aspirin.   Other AC:  *** The pt is *** on medication for hypertension.   The pt is *** diabetic.  Tobacco hx:  ***  Past Medical History:  Diagnosis Date   High cholesterol    Hypertension    Prostate CA (HCC)     No past surgical history on file.  Social History   Socioeconomic History   Marital status: Single    Spouse name: Not on file   Number of children: Not on file   Years of education: Not on file   Highest education level: Not on file  Occupational History   Not on file  Tobacco Use   Smoking status: Every Day    Current packs/day: 1.00    Average packs/day: 1 pack/day for 40.0 years (40.0 ttl pk-yrs)    Types: Cigarettes   Smokeless tobacco: Never  Vaping Use   Vaping status: Never Used  Substance and Sexual Activity   Alcohol use: Not Currently    Comment: weekly    Drug use: Not Currently    Types: Cocaine    Comment: crack   Sexual activity: Not on file  Other Topics Concern   Not on file  Social History Narrative   Not on file   Social Determinants of Health   Financial Resource Strain: Not on file  Food Insecurity: No Food Insecurity (10/16/2021)   Hunger Vital Sign    Worried About Running Out of Food in the Last Year: Never true    Ran Out of Food in the Last Year: Never true  Transportation Needs: No Transportation Needs (10/16/2021)   PRAPARE - Administrator, Civil Service (Medical): No    Lack  of Transportation (Non-Medical): No  Physical Activity: Not on file  Stress: Not on file  Social Connections: Unknown (08/15/2021)   [facility] from [facility]   Social Network    Social Network: Not on file  Intimate Partner Violence: Unknown (08/15/2021)   Received from [facility]   HITS    Physically Hurt: Not on file    Insult or Talk Down To: Not on file    Threaten Physical Harm: Not on file    Scream or Curse: Not on file   *** Family History  Problem Relation Age of Onset   Hypertension Mother    Cancer Mother    Hypertension Father    Cancer Father    Diabetes Maternal Grandmother    Heart disease Paternal Grandfather     Current Outpatient Medications  Medication Sig Dispense Refill   amLODipine (NORVASC) 10 MG tablet Take 1 tablet (10 mg total) by mouth daily. 90 tablet 1   diclofenac Sodium (VOLTAREN) 1 % GEL Apply 4 g topically 4 (four) times daily. 200 g 6   enzalutamide (XTANDI) 40 MG tablet Take 4 tablets (160mg ) by mouth once daily as directed by physician. 120  tablet 2   ezetimibe (ZETIA) 10 MG tablet Take 1 tablet (10 mg total) by mouth daily. 90 tablet 3   losartan-hydrochlorothiazide (HYZAAR) 50-12.5 MG tablet Take 1 tablet by mouth daily. 90 tablet 1   Relugolix (ORGOVYX) 120 MG TABS Take 1 tablet (120 mg total) by mouth daily. 30 tablet 11   rosuvastatin (CRESTOR) 20 MG tablet Take 1 tablet (20 mg total) by mouth daily. 90 tablet 3   sulfamethoxazole-trimethoprim (BACTRIM DS) 800-160 MG tablet Take 1 tablet by mouth 2 (two) times daily for 10 days. 20 tablet 0   trazodone (DESYREL) 300 MG tablet Take 1 tablet (300 mg total) by mouth at bedtime. 90 tablet 1   No current facility-administered medications for this visit.    No Known Allergies   REVIEW OF SYSTEMS:  *** [X]  denotes positive finding, [ ]  denotes negative finding Cardiac  Comments:  Chest pain or chest pressure:    Shortness of breath upon exertion:    Short of breath when lying flat:     Irregular heart rhythm:        Vascular    Pain in calf, thigh, or hip brought on by ambulation:    Pain in feet at night that wakes you up from your sleep:     Blood clot in your veins:    Leg swelling:         Pulmonary    Oxygen at home:    Productive cough:     Wheezing:         Neurologic    Sudden weakness in arms or legs:     Sudden numbness in arms or legs:     Sudden onset of difficulty speaking or slurred speech:    Temporary loss of vision in one eye:     Problems with dizziness:         Gastrointestinal    Blood in stool:     Vomited blood:         Genitourinary    Burning when urinating:     Blood in urine:        Psychiatric    Major depression:         Hematologic    Bleeding problems:    Problems with blood clotting too easily:        Skin    Rashes or ulcers:        Constitutional    Fever or chills:      PHYSICAL EXAMINATION:  There were no vitals filed for this visit.  General:  WDWN in NAD; vital signs documented above Gait: Not observed HENT: WNL, normocephalic Pulmonary: normal non-labored breathing , without wheezing Cardiac: {Desc; regular/irreg:14544} HR Abdomen: soft, NT, no masses Skin: {With/Without:20273} rashes Vascular Exam/Pulses:  Right Left  Radial {Exam; arterial pulse strength 0-4:30167} {Exam; arterial pulse strength 0-4:30167}  Ulnar {Exam; arterial pulse strength 0-4:30167} {Exam; arterial pulse strength 0-4:30167}  Femoral {Exam; arterial pulse strength 0-4:30167} {Exam; arterial pulse strength 0-4:30167}  Popliteal {Exam; arterial pulse strength 0-4:30167} {Exam; arterial pulse strength 0-4:30167}  DP {Exam; arterial pulse strength 0-4:30167} {Exam; arterial pulse strength 0-4:30167}  PT {Exam; arterial pulse strength 0-4:30167} {Exam; arterial pulse strength 0-4:30167}   Extremities: {With/Without:20273} ischemic changes, {With/Without:20273} Gangrene , {With/Without:20273} cellulitis; {With/Without:20273}  open wounds;  Musculoskeletal: no muscle wasting or atrophy  Neurologic: A&O X 3;  No focal weakness or paresthesias are detected Psychiatric:  The pt has {Desc; normal/abnormal:11317::"Normal"} affect.   Non-Invasive Vascular Imaging:   ***  ASSESSMENT/PLAN: Tyler Pratt is a 67 y.o. male presenting with ***   ***   Victorino Sparrow, MD [facility] [phone]

## 2022-12-27 NOTE — Progress Notes (Signed)
[facility] CSW Progress Note  Clinical Child psychotherapist contacted patient by phone to provide resources for pt's financial concerns.  Pt presently undergoing hormone treatment for stage IV prostate cancer.  Pt inquiring about food resources.  CSW emailed pt a list of local food banks and informed pt he is eligible to pick up a food bag at the cancer center when he comes in for his appointments.  Pt reports his social security disability was approved and he receives $1,400 a month which he was informed disqualifies him for Medicaid or food stamps.  Pt provided w/ contact information for [facility] for additional resources.  CSW to remain available to provide support as appropriate for pt throughout duration of treatment.      Rachel Moulds, LCSW Clinical Social Worker [facility]

## 2022-12-28 ENCOUNTER — Other Ambulatory Visit: Payer: Self-pay

## 2022-12-28 ENCOUNTER — Encounter: Payer: Self-pay | Admitting: Vascular Surgery

## 2022-12-28 ENCOUNTER — Other Ambulatory Visit: Payer: Self-pay | Admitting: Hematology

## 2022-12-28 ENCOUNTER — Ambulatory Visit (INDEPENDENT_AMBULATORY_CARE_PROVIDER_SITE_OTHER): Payer: Medicare Other | Admitting: Vascular Surgery

## 2022-12-28 ENCOUNTER — Other Ambulatory Visit: Payer: Self-pay | Admitting: *Deleted

## 2022-12-28 VITALS — BP 133/74 | HR 76 | Temp 97.9°F | Resp 20 | Ht 70.0 in | Wt 172.0 lb

## 2022-12-28 DIAGNOSIS — C61 Malignant neoplasm of prostate: Secondary | ICD-10-CM

## 2022-12-28 DIAGNOSIS — I7102 Dissection of abdominal aorta: Secondary | ICD-10-CM | POA: Diagnosis not present

## 2022-12-31 ENCOUNTER — Other Ambulatory Visit (HOSPITAL_COMMUNITY): Payer: Self-pay

## 2022-12-31 ENCOUNTER — Telehealth: Payer: Self-pay | Admitting: Pharmacy Technician

## 2022-12-31 ENCOUNTER — Other Ambulatory Visit: Payer: Self-pay

## 2022-12-31 DIAGNOSIS — C61 Malignant neoplasm of prostate: Secondary | ICD-10-CM

## 2022-12-31 MED ORDER — ENZALUTAMIDE 40 MG PO TABS: 160.0000 mg | ORAL_TABLET | Freq: Every day | ORAL | 1 refills | Status: DC

## 2022-12-31 NOTE — Telephone Encounter (Signed)
Oral Oncology Patient Advocate Encounter   Received notification that patient is due for re-enrollment for assistance for Xtandi through [employer].   Re-enrollment submitted via online portal.  [place] phone number [phone].   I will continue to follow until final determination.  Jinger Neighbors, CPhT-Adv Oncology Pharmacy Patient Advocate [facility] Direct Number: [phone]  Fax: [phone]

## 2023-01-02 ENCOUNTER — Other Ambulatory Visit: Payer: Self-pay

## 2023-01-02 NOTE — Telephone Encounter (Signed)
Oral Oncology Patient Advocate Encounter   Received notification that the application for assistance for Xtandi through [employer]  has been approved.   [employer] number [phone].   Effective dates: 01/02/23 through 01/02/24  Medication will be filled at [facility].  I have spoken to the patient.  Jinger Neighbors, CPhT-Adv Oncology Pharmacy Patient Advocate [facility] Direct Number: [phone]  Fax: [phone]

## 2023-01-04 ENCOUNTER — Other Ambulatory Visit: Payer: Self-pay

## 2023-01-04 DIAGNOSIS — I7102 Dissection of abdominal aorta: Secondary | ICD-10-CM

## 2023-01-07 ENCOUNTER — Other Ambulatory Visit: Payer: Self-pay

## 2023-01-10 ENCOUNTER — Inpatient Hospital Stay: Payer: Medicare Other

## 2023-01-10 ENCOUNTER — Inpatient Hospital Stay: Payer: Medicare Other | Admitting: Hematology

## 2023-01-10 VITALS — BP 152/75 | HR 77 | Temp 98.5°F | Resp 18 | Ht 70.0 in | Wt 175.0 lb

## 2023-01-10 DIAGNOSIS — Z79899 Other long term (current) drug therapy: Secondary | ICD-10-CM | POA: Diagnosis not present

## 2023-01-10 DIAGNOSIS — C775 Secondary and unspecified malignant neoplasm of intrapelvic lymph nodes: Secondary | ICD-10-CM | POA: Diagnosis not present

## 2023-01-10 DIAGNOSIS — C61 Malignant neoplasm of prostate: Secondary | ICD-10-CM

## 2023-01-10 DIAGNOSIS — G629 Polyneuropathy, unspecified: Secondary | ICD-10-CM

## 2023-01-10 DIAGNOSIS — M25571 Pain in right ankle and joints of right foot: Secondary | ICD-10-CM | POA: Diagnosis not present

## 2023-01-10 DIAGNOSIS — M25572 Pain in left ankle and joints of left foot: Secondary | ICD-10-CM | POA: Diagnosis not present

## 2023-01-10 DIAGNOSIS — E538 Deficiency of other specified B group vitamins: Secondary | ICD-10-CM

## 2023-01-10 DIAGNOSIS — R262 Difficulty in walking, not elsewhere classified: Secondary | ICD-10-CM | POA: Diagnosis not present

## 2023-01-10 LAB — CMP (CANCER CENTER ONLY)
ALT: 6 U/L (ref 0–44)
AST: 11 U/L — ABNORMAL LOW (ref 15–41)
Albumin: 4.1 g/dL (ref 3.5–5.0)
Alkaline Phosphatase: 89 U/L (ref 38–126)
Anion gap: 6 (ref 5–15)
BUN: 12 mg/dL (ref 8–23)
CO2: 26 mmol/L (ref 22–32)
Calcium: 8.8 mg/dL — ABNORMAL LOW (ref 8.9–10.3)
Chloride: 107 mmol/L (ref 98–111)
Creatinine: 0.88 mg/dL (ref 0.61–1.24)
GFR, Estimated: >60 mL/min (ref >60)
Glucose, Bld: 140 mg/dL — ABNORMAL HIGH (ref 70–99)
Potassium: 4.1 mmol/L (ref 3.5–5.1)
Sodium: 139 mmol/L (ref 135–145)
Total Bilirubin: 0.3 mg/dL (ref 0.3–1.2)
Total Protein: 6.7 g/dL (ref 6.5–8.1)

## 2023-01-10 LAB — CBC WITH DIFFERENTIAL (CANCER CENTER ONLY)
Abs Immature Granulocytes: 0.04 10*3/uL (ref 0.00–0.07)
Basophils Absolute: 0.1 10*3/uL (ref 0.0–0.1)
Basophils Relative: 1 %
Eosinophils Absolute: 0.1 10*3/uL (ref 0.0–0.5)
Eosinophils Relative: 2 %
HCT: 32.7 % — ABNORMAL LOW (ref 39.0–52.0)
Hemoglobin: 11.3 g/dL — ABNORMAL LOW (ref 13.0–17.0)
Immature Granulocytes: 1 %
Lymphocytes Relative: 23 %
Lymphs Abs: 2 10*3/uL (ref 0.7–4.0)
MCH: 31.5 pg (ref 26.0–34.0)
MCHC: 34.6 g/dL (ref 30.0–36.0)
MCV: 91.1 fL (ref 80.0–100.0)
Monocytes Absolute: 0.4 10*3/uL (ref 0.1–1.0)
Monocytes Relative: 5 %
Neutro Abs: 6 10*3/uL (ref 1.7–7.7)
Neutrophils Relative %: 68 %
Platelet Count: 388 10*3/uL (ref 150–400)
RBC: 3.59 MIL/uL — ABNORMAL LOW (ref 4.22–5.81)
RDW: 12.5 % (ref 11.5–15.5)
WBC Count: 8.7 10*3/uL (ref 4.0–10.5)
nRBC: 0 % (ref 0.0–0.2)

## 2023-01-10 LAB — VITAMIN B12: Vitamin B-12: 133 pg/mL — ABNORMAL LOW (ref 180–914)
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
## 2023-01-10 NOTE — Assessment & Plan Note (Signed)
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  -staging CT showed enlarged abdominal adenopathy, consistent with metastatic disease. -He is currently on Orgovyx 120 mg daily and Xtandi 160 mg daily started in June 2023 -He has had excellent response to treatment, his PSA level has been undetectable since October 2023 -He is clinically doing very well,  and tolerates treatment well. -He will continue follow-up with his urologist Dr. Ronne Binning every 6 months who prescribes Orgovyx for him -his CT CAP w contrast on 07/05/2022 was negative for PE or other acute findings, his previous adenopathy has resolved, indicating good response to therapy

## 2023-01-11 ENCOUNTER — Telehealth: Payer: Self-pay | Admitting: Hematology

## 2023-01-11 ENCOUNTER — Telehealth: Payer: Self-pay

## 2023-01-11 ENCOUNTER — Other Ambulatory Visit: Payer: Self-pay

## 2023-01-11 LAB — PROSTATE-SPECIFIC AG, SERUM (LABCORP): Prostate Specific Ag, Serum: <0.1 ng/mL (ref 0.0–4.0)

## 2023-01-11 NOTE — Telephone Encounter (Signed)
Spoke with pt via telephone to inform pt that Dr. Mosetta Putt has reviewed his recent labs and pt's B12 is low; therefore, Dr. Mosetta Putt would like for him to get vitamin B12 injections weekly for 8 wks and then monthly.  Stated Dr. Mosetta Putt will continue to draw labs and monitor pt's B12.  Stated Erie Noe w/Dr. Feng's Scheduling Team will be contacting pt to get him scheduled for his B12 injections.  Pt verbalized understanding and had no further questions or concerns.

## 2023-01-15 ENCOUNTER — Encounter: Payer: Self-pay | Admitting: Hematology

## 2023-01-15 ENCOUNTER — Ambulatory Visit: Payer: Medicare Other | Attending: Nurse Practitioner

## 2023-01-15 ENCOUNTER — Other Ambulatory Visit: Payer: Self-pay

## 2023-01-15 VITALS — Ht 70.0 in | Wt 175.0 lb

## 2023-01-15 DIAGNOSIS — Z0000 Encounter for general adult medical examination without abnormal findings: Secondary | ICD-10-CM

## 2023-01-15 NOTE — Progress Notes (Signed)
Subjective:   Tyler Pratt is a 67 y.o. male who presents for an Initial Medicare Annual Wellness Visit.  Visit Complete: Virtual  I connected with  Olive Bass on 01/15/23 by a audio enabled telemedicine application and verified that I am speaking with the correct person using two identifiers.  Patient Location: Home  Provider Location: Home Office  I discussed the limitations of evaluation and management by telemedicine. The patient expressed understanding and agreed to proceed.  Because this visit was a virtual/telehealth visit, some criteria may be missing or patient reported. Any vitals not documented were not able to be obtained and vitals that have been documented are patient reported.   Cardiac Risk Factors include: advanced age (>77men, >62 women);smoking/ tobacco exposure;male gender;hypertension     Objective:    Today's Vitals   01/15/23 1942  Weight: 175 lb (79.4 kg)  Height: 5\' 10"  (1.778 m)   Body mass index is 25.11 kg/m.     01/15/2023    7:45 PM 12/24/2022    1:11 PM 11/01/2021    2:12 PM 10/16/2021    9:48 AM  Advanced Directives  Does Patient Have a Medical Advance Directive? No No No No  Would patient like information on creating a medical advance directive? Yes (MAU/Ambulatory/Procedural Areas - Information given) No - Patient declined Yes (MAU/Ambulatory/Procedural Areas - Information given)     Current Medications (verified) Outpatient Encounter Medications as of 01/15/2023  Medication Sig   amLODipine (NORVASC) 10 MG tablet Take 1 tablet (10 mg total) by mouth daily.   diclofenac Sodium (VOLTAREN) 1 % GEL Apply 4 g topically 4 (four) times daily.   enzalutamide (XTANDI) 40 MG tablet Take 4 tablets (160 mg total) by mouth daily.   ezetimibe (ZETIA) 10 MG tablet Take 1 tablet (10 mg total) by mouth daily.   losartan-hydrochlorothiazide (HYZAAR) 50-12.5 MG tablet Take 1 tablet by mouth daily.   Relugolix (ORGOVYX) 120 MG TABS Take 1 tablet (120 mg  total) by mouth daily.   rosuvastatin (CRESTOR) 20 MG tablet Take 1 tablet (20 mg total) by mouth daily.   trazodone (DESYREL) 300 MG tablet Take 1 tablet (300 mg total) by mouth at bedtime.   No facility-administered encounter medications on file as of 01/15/2023.    Allergies (verified) Patient has no known allergies.   History: Past Medical History:  Diagnosis Date   High cholesterol    Hypertension    Prostate CA [facility])    History reviewed. No pertinent surgical history. Family History  Problem Relation Age of Onset   Hypertension Mother    Cancer Mother    Hypertension Father    Cancer Father    Diabetes Maternal Grandmother    Heart disease Paternal Grandfather    Social History   Socioeconomic History   Marital status: Single    Spouse name: Not on file   Number of children: Not on file   Years of education: Not on file   Highest education level: Not on file  Occupational History   Not on file  Tobacco Use   Smoking status: Every Day    Current packs/day: 1.00    Average packs/day: 1 pack/day for 40.0 years (40.0 ttl pk-yrs)    Types: Cigarettes   Smokeless tobacco: Never  Vaping Use   Vaping status: Never Used  Substance and Sexual Activity   Alcohol use: Not Currently    Comment: weekly    Drug use: Not Currently    Types: Cocaine  Comment: crack   Sexual activity: Not on file  Other Topics Concern   Not on file  Social History Narrative   Not on file   Social Determinants of Health   Financial Resource Strain: Low Risk  (01/15/2023)   Overall Financial Resource Strain (CARDIA)    Difficulty of Paying Living Expenses: Not hard at all  Food Insecurity: No Food Insecurity (01/15/2023)   Hunger Vital Sign    Worried About Running Out of Food in the Last Year: Never true    Ran Out of Food in the Last Year: Never true  Transportation Needs: No Transportation Needs (01/15/2023)   PRAPARE - Administrator, Civil Service (Medical): No     Lack of Transportation (Non-Medical): No  Physical Activity: Inactive (01/15/2023)   Exercise Vital Sign    Days of Exercise per Week: 0 days    Minutes of Exercise per Session: 0 min  Stress: No Stress Concern Present (01/15/2023)   [name] of Occupational Health - Occupational Stress Questionnaire    Feeling of Stress : Not at all  Social Connections: Moderately Isolated (01/15/2023)   Social Connection and Isolation Panel [NHANES]    Frequency of Communication with Friends and Family: More than three times a week    Frequency of Social Gatherings with Friends and Family: Three times a week    Attends Religious Services: 1 to 4 times per year    Active Member of Clubs or Organizations: No    Attends Banker Meetings: Never    Marital Status: Never married    Tobacco Counseling Ready to quit: Not Answered Counseling given: Not Answered   Clinical Intake:  Pre-visit preparation completed: Yes  Pain : No/denies pain     Diabetes: No  How often do you need to have someone help you when you read instructions, pamphlets, or other written materials from your doctor or pharmacy?: 1 - Never  Interpreter Needed?: No  Information entered by :: Kandis Fantasia LPN   Activities of Daily Living    01/15/2023    7:42 PM  In your present state of health, do you have any difficulty performing the following activities:  Hearing? 0  Vision? 0  Difficulty concentrating or making decisions? 0  Walking or climbing stairs? 0  Dressing or bathing? 0  Doing errands, shopping? 0  Preparing Food and eating ? N  Using the Toilet? N  In the past six months, have you accidently leaked urine? N  Do you have problems with loss of bowel control? N  Managing your Medications? N  Managing your Finances? N  Housekeeping or managing your Housekeeping? N    Patient Care Team: Claiborne Rigg, NP as PCP - General (Nurse Practitioner) Cherlyn Cushing, RN as Oncology Nurse  Navigator Duffy, Pascal Lux, LCSW as Social Worker (Licensed Clinical Social Worker) Malachy Mood, MD as Attending Physician (Hematology and Oncology)  Indicate any recent Medical Services you may have received from other than [facility] providers in the past year (date may be approximate).     Assessment:   This is a routine wellness examination for [place].  Hearing/Vision screen Hearing Screening - Comments:: Denies hearing difficulties   Vision Screening - Comments:: No vision problems; will schedule routine eye exam soon     Goals Addressed             This Visit's Progress    DIET - EAT MORE FRUITS AND VEGETABLES  Depression Screen    01/15/2023    7:44 PM 10/24/2022    3:05 PM 07/25/2022    2:22 PM 04/25/2022    3:15 PM 10/25/2021    4:27 PM 07/26/2021    4:21 PM 02/22/2021    9:22 AM  PHQ 2/9 Scores  PHQ - 2 Score 0 0 2 0 2 1 2   PHQ- 9 Score  0 5 5 7 6 6     Fall Risk    01/15/2023    7:45 PM 10/24/2022    3:04 PM 07/25/2022    2:21 PM 04/25/2022    2:49 PM 10/25/2021    4:28 PM  Fall Risk   Falls in the past year? 0 0 1 0 0  Number falls in past yr: 0 0 1 0 0  Injury with Fall? 0 0 0 0 0  Risk for fall due to : No Fall Risks No Fall Risks No Fall Risks  No Fall Risks  Follow up Falls prevention discussed;Education provided;Falls evaluation completed Falls evaluation completed Falls evaluation completed Falls evaluation completed Falls evaluation completed    MEDICARE RISK AT HOME: Medicare Risk at Home Any stairs in or around the home?: No If so, are there any without handrails?: No Home free of loose throw rugs in walkways, pet beds, electrical cords, etc?: Yes Adequate lighting in your home to reduce risk of falls?: Yes Life alert?: No Use of a cane, walker or w/c?: No Grab bars in the bathroom?: No Shower chair or bench in shower?: No Elevated toilet seat or a handicapped toilet?: No  TIMED UP AND GO:  Was the test performed? No    Cognitive Function:         01/15/2023    7:46 PM  6CIT Screen  What Year? 0 points  What month? 0 points  What time? 0 points  Count back from 20 0 points  Months in reverse 2 points  Repeat phrase 2 points  Total Score 4 points    Immunizations  There is no immunization history on file for this patient.  TDAP status: Due, Education has been provided regarding the importance of this vaccine. Advised may receive this vaccine at local pharmacy or Health Dept. Aware to provide a copy of the vaccination record if obtained from local pharmacy or Health Dept. Verbalized acceptance and understanding.  Flu Vaccine status: Declined, Education has been provided regarding the importance of this vaccine but patient still declined. Advised may receive this vaccine at local pharmacy or Health Dept. Aware to provide a copy of the vaccination record if obtained from local pharmacy or Health Dept. Verbalized acceptance and understanding.  Pneumococcal vaccine status: Declined,  Education has been provided regarding the importance of this vaccine but patient still declined. Advised may receive this vaccine at local pharmacy or Health Dept. Aware to provide a copy of the vaccination record if obtained from local pharmacy or Health Dept. Verbalized acceptance and understanding.   Covid-19 vaccine status: Declined, Education has been provided regarding the importance of this vaccine but patient still declined. Advised may receive this vaccine at local pharmacy or Health Dept.or vaccine clinic. Aware to provide a copy of the vaccination record if obtained from local pharmacy or Health Dept. Verbalized acceptance and understanding.  Qualifies for Shingles Vaccine? Yes   Zostavax completed No   Shingrix Completed?: No.    Education has been provided regarding the importance of this vaccine. Patient has been advised to call insurance company to determine  out of pocket expense if they have not yet received this vaccine. Advised may  also receive vaccine at local pharmacy or Health Dept. Verbalized acceptance and understanding.  Screening Tests Health Maintenance  Topic Date Due   COVID-19 Vaccine (1) Never done   INFLUENZA VACCINE  Never done   Zoster Vaccines- Shingrix (1 of 2) 01/24/2023 (Originally 12/29/1974)   Pneumonia Vaccine 38+ Years old (1 of 2 - PCV) 04/26/2023 (Originally 12/29/1961)   Lung Cancer Screening  07/05/2023   Medicare Annual Wellness (AWV)  01/15/2024   Hepatitis C Screening  Completed   HPV VACCINES  Aged Out   DTaP/Tdap/Td  Discontinued   Colonoscopy  Discontinued    Health Maintenance  Health Maintenance Due  Topic Date Due   COVID-19 Vaccine (1) Never done   INFLUENZA VACCINE  Never done    Colorectal cancer screening:  Patient declines   Lung Cancer Screening: (Low Dose CT Chest recommended if Age 61-80 years, 20 pack-year currently smoking OR have quit w/in 15years.) does qualify.   Lung Cancer Screening Referral: last 07/05/22  Additional Screening:  Hepatitis C Screening: does qualify; Completed 08/24/20  Vision Screening: Recommended annual ophthalmology exams for early detection of glaucoma and other disorders of the eye. Is the patient up to date with their annual eye exam?  No  Who is the provider or what is the name of the office in which the patient attends annual eye exams? none If pt is not established with a provider, would they like to be referred to a provider to establish care? No .   Dental Screening: Recommended annual dental exams for proper oral hygiene  Community Resource Referral / Chronic Care Management: CRR required this visit?  No   CCM required this visit?  No    Plan:     I have personally reviewed and noted the following in the patient's chart:   Medical and social history Use of alcohol, tobacco or illicit drugs  Current medications and supplements including opioid prescriptions. Patient is not currently taking opioid  prescriptions. Functional ability and status Nutritional status Physical activity Advanced directives List of other physicians Hospitalizations, surgeries, and ER visits in previous 12 months Vitals Screenings to include cognitive, depression, and falls Referrals and appointments  In addition, I have reviewed and discussed with patient certain preventive protocols, quality metrics, and best practice recommendations. A written personalized care plan for preventive services as well as general preventive health recommendations were provided to patient.     Kandis Fantasia [place], [place]   [id number]/12/[id number]   After Visit Summary: (Mail) Due to this being a telephonic visit, the after visit summary with patients personalized plan was offered to patient via mail   Nurse Notes: No concerns at this time

## 2023-01-15 NOTE — Patient Instructions (Addendum)
Tyler Pratt , Thank you for taking time to come for your Medicare Wellness Visit. I appreciate your ongoing commitment to your health goals. Please review the following plan we discussed and let me know if I can assist you in the future.   Referrals/Orders/Follow-Ups/Clinician Recommendations: Aim for 30 minutes of exercise or brisk walking, 6-8 glasses of water, and 5 servings of fruits and vegetables each day.  The number for Dr. Ronne Binning (urology) is [phone]  This is a list of the screening recommended for you and due dates:  Health Maintenance  Topic Date Due   COVID-19 Vaccine (1) Never done   Flu Shot  Never done   Zoster (Shingles) Vaccine (1 of 2) 01/24/2023*   Pneumonia Vaccine (1 of 2 - PCV) 04/26/2023*   Screening for Lung Cancer  07/05/2023   Medicare Annual Wellness Visit  01/15/2024   Hepatitis C Screening  Completed   HPV Vaccine  Aged Out   DTaP/Tdap/Td vaccine  Discontinued   Colon Cancer Screening  Discontinued  *Topic was postponed. The date shown is not the original due date.    Advanced directives: (ACP Link)Information on Advanced Care Planning can be found at [facility] of [place] Advance Health Care Directives Advance Health Care Directives ([email or web address]   Next Medicare Annual Wellness Visit scheduled for next year: Yes

## 2023-01-16 ENCOUNTER — Other Ambulatory Visit: Payer: Self-pay

## 2023-01-16 ENCOUNTER — Inpatient Hospital Stay: Payer: Medicare Other | Attending: Hematology

## 2023-01-16 DIAGNOSIS — C61 Malignant neoplasm of prostate: Secondary | ICD-10-CM | POA: Diagnosis present

## 2023-01-16 DIAGNOSIS — E538 Deficiency of other specified B group vitamins: Secondary | ICD-10-CM | POA: Insufficient documentation

## 2023-01-16 DIAGNOSIS — C775 Secondary and unspecified malignant neoplasm of intrapelvic lymph nodes: Secondary | ICD-10-CM | POA: Insufficient documentation

## 2023-01-16 MED ORDER — CYANOCOBALAMIN 1000 MCG/ML IJ SOLN
1000.0000 ug | Freq: Once | INTRAMUSCULAR | Status: AC
  Administered 2023-01-16: 1000 ug via INTRAMUSCULAR
  Filled 2023-01-16: qty 1

## 2023-01-23 ENCOUNTER — Inpatient Hospital Stay: Payer: Medicare Other

## 2023-01-23 DIAGNOSIS — E538 Deficiency of other specified B group vitamins: Secondary | ICD-10-CM

## 2023-01-23 MED ORDER — CYANOCOBALAMIN 1000 MCG/ML IJ SOLN
1000.0000 ug | Freq: Once | INTRAMUSCULAR | Status: AC
  Administered 2023-01-23: 1000 ug via INTRAMUSCULAR
  Filled 2023-01-23: qty 1

## 2023-01-25 ENCOUNTER — Other Ambulatory Visit: Payer: Self-pay

## 2023-01-25 ENCOUNTER — Ambulatory Visit: Payer: Medicare Other | Attending: Nurse Practitioner | Admitting: Nurse Practitioner

## 2023-01-25 ENCOUNTER — Encounter: Payer: Self-pay | Admitting: Nurse Practitioner

## 2023-01-25 ENCOUNTER — Encounter: Payer: Self-pay | Admitting: Hematology

## 2023-01-25 VITALS — BP 111/67 | HR 77 | Ht 70.0 in | Wt 172.6 lb

## 2023-01-25 DIAGNOSIS — F17209 Nicotine dependence, unspecified, with unspecified nicotine-induced disorders: Secondary | ICD-10-CM

## 2023-01-25 DIAGNOSIS — Z0000 Encounter for general adult medical examination without abnormal findings: Secondary | ICD-10-CM

## 2023-01-25 DIAGNOSIS — G629 Polyneuropathy, unspecified: Secondary | ICD-10-CM

## 2023-01-25 MED ORDER — NICOTINE 14 MG/24HR TD PT24
14.0000 mg | MEDICATED_PATCH | Freq: Every day | TRANSDERMAL | 0 refills | Status: DC
Start: 2023-01-25 — End: 2023-04-30
  Filled 2023-01-25: qty 28, 28d supply, fill #0

## 2023-01-25 MED ORDER — GABAPENTIN 100 MG PO CAPS
100.0000 mg | ORAL_CAPSULE | Freq: Three times a day (TID) | ORAL | 3 refills | Status: DC
Start: 2023-01-25 — End: 2023-04-30
  Filled 2023-01-25: qty 90, 30d supply, fill #0

## 2023-01-25 NOTE — Progress Notes (Signed)
Assessment & Plan:  Tyler Pratt was seen today for annual exam.  Diagnoses and all orders for this visit:  Encounter for annual physical exam  Neuropathy -     gabapentin (NEURONTIN) 100 MG capsule; Take 1 capsule (100 mg total) by mouth 3 (three) times daily.  Tobacco use disorder, continuous -     nicotine (NICODERM CQ - DOSED IN MG/24 HOURS) 14 mg/24hr patch; Place 1 patch (14 mg total) onto the skin daily.    Patient has been counseled on age-appropriate routine health concerns for screening and prevention. These are reviewed and up-to-date. Referrals have been placed accordingly. Immunizations are up-to-date or declined.    Subjective:   Chief Complaint  Patient presents with   Annual Exam   HPI WILLLIAM Pratt 67 y.o. male presents to office today for annual physical exam4  He is currently receiving b12 injections for peripheral neuropathy which he reports is persistent and not well controlled. Will restart gabapentin. I am not sure why it was stopped.   He is trying to stop smoking. States he was told he has a tear in his aorta that currently does not require any surgical intervention and he was instructed to stop smoking cigarettes and using cocaine. (He has chronic infrarenal aortic dissection) PER [facility] note: The lesion is focal, and appears unchanged.  With well-managed hypertension, my assumption is that this likely happened years ago and could have occurred during an episode of cocaine use. Khris and I had a long discussion regarding the above, including the natural history of aortic dissection.  The fact that the dissection is unchanged over the last year is encouraging.  There are no signs of aneurysmal dilatation, but this is something that we need to watch.  My plan is to see him on a yearly basis with infrarenal abdominal ultrasound to ensure there is no aneurysmal dilatation that occurs.  He was asked to call my office should any questions or concerns arise and  immediately present to the ED should any new onset claudication, ischemic rest pain, abdominal pain, back pain occur.    Review of Systems  Constitutional:  Negative for fever, malaise/fatigue and weight loss.  HENT: Negative.  Negative for nosebleeds.   Eyes: Negative.  Negative for blurred vision, double vision and photophobia.  Respiratory: Negative.  Negative for cough and shortness of breath.   Cardiovascular: Negative.  Negative for chest pain, palpitations and leg swelling.  Gastrointestinal: Negative.  Negative for heartburn, nausea and vomiting.  Genitourinary: Negative.   Musculoskeletal: Negative.  Negative for myalgias.  Skin: Negative.   Neurological:  Positive for tingling and sensory change. Negative for dizziness, focal weakness, seizures and headaches.  Endo/Heme/Allergies: Negative.   Psychiatric/Behavioral: Negative.  Negative for suicidal ideas.     Past Medical History:  Diagnosis Date   High cholesterol    Hypertension    Prostate CA [facility])     History reviewed. No pertinent surgical history.  Family History  Problem Relation Age of Onset   Hypertension Mother    Cancer Mother    Hypertension Father    Cancer Father    Diabetes Maternal Grandmother    Heart disease Paternal Grandfather     Social History Reviewed with no changes to be made today.   Outpatient Medications Prior to Visit  Medication Sig Dispense Refill   amLODipine (NORVASC) 10 MG tablet Take 1 tablet (10 mg total) by mouth daily. 90 tablet 1   diclofenac Sodium (VOLTAREN) 1 % GEL  Apply 4 g topically 4 (four) times daily. 200 g 6   enzalutamide (XTANDI) 40 MG tablet Take 4 tablets (160 mg total) by mouth daily. 120 tablet 1   ezetimibe (ZETIA) 10 MG tablet Take 1 tablet (10 mg total) by mouth daily. 90 tablet 3   losartan-hydrochlorothiazide (HYZAAR) 50-12.5 MG tablet Take 1 tablet by mouth daily. 90 tablet 1   Relugolix (ORGOVYX) 120 MG TABS Take 1 tablet (120 mg total) by mouth  daily. 30 tablet 11   rosuvastatin (CRESTOR) 20 MG tablet Take 1 tablet (20 mg total) by mouth daily. 90 tablet 3   trazodone (DESYREL) 300 MG tablet Take 1 tablet (300 mg total) by mouth at bedtime. 90 tablet 1   No facility-administered medications prior to visit.    No Known Allergies     Objective:    BP 111/67 (BP Location: Left Arm, Patient Position: Sitting, Cuff Size: Normal)   Pulse 77   Ht 5\' 10"  (1.778 m)   Wt 172 lb 9.6 oz (78.3 kg)   SpO2 98%   BMI 24.77 kg/m  Wt Readings from Last 3 Encounters:  01/25/23 172 lb 9.6 oz (78.3 kg)  01/15/23 175 lb (79.4 kg)  01/10/23 175 lb (79.4 kg)    Physical Exam Constitutional:      Appearance: He is well-developed.  HENT:     Head: Normocephalic and atraumatic.     Right Ear: Hearing, tympanic membrane, ear canal and external ear normal.     Left Ear: Hearing, tympanic membrane, ear canal and external ear normal.     Nose: Nose normal. No mucosal edema or rhinorrhea.     Right Turbinates: Not enlarged.     Left Turbinates: Not enlarged.     Mouth/Throat:     Lips: Pink.     Mouth: Mucous membranes are moist.     Dentition: No gingival swelling, dental abscesses or gum lesions.     Pharynx: Uvula midline.     Tonsils: No tonsillar exudate. 1+ on the right. 1+ on the left.  Eyes:     General: Lids are normal. No scleral icterus.    Extraocular Movements: Extraocular movements intact.     Conjunctiva/sclera: Conjunctivae normal.     Pupils: Pupils are equal, round, and reactive to light.  Neck:     Thyroid: No thyromegaly.     Trachea: No tracheal deviation.  Cardiovascular:     Rate and Rhythm: Normal rate and regular rhythm.     Heart sounds: Normal heart sounds. No murmur heard.    No friction rub. No gallop.  Pulmonary:     Effort: Pulmonary effort is normal. No respiratory distress.     Breath sounds: Normal breath sounds. No wheezing or rales.  Chest:     Chest wall: No mass or tenderness.  Breasts:     Right: No inverted nipple, mass, nipple discharge, skin change or tenderness.     Left: No inverted nipple, mass, nipple discharge, skin change or tenderness.  Abdominal:     General: Bowel sounds are normal. There is no distension.     Palpations: Abdomen is soft. There is no mass.     Tenderness: There is no abdominal tenderness. There is no guarding or rebound.  Musculoskeletal:        General: No tenderness or deformity. Normal range of motion.     Cervical back: Normal range of motion and neck supple.  Lymphadenopathy:     Cervical: No cervical adenopathy.  Skin:    General: Skin is warm and dry.     Capillary Refill: Capillary refill takes less than 2 seconds.     Findings: No erythema.  Neurological:     Mental Status: He is alert and oriented to person, place, and time.     Cranial Nerves: No cranial nerve deficit.     Sensory: Sensation is intact.     Motor: No abnormal muscle tone.     Coordination: Coordination is intact. Coordination normal.     Gait: Gait is intact.     Deep Tendon Reflexes: Reflexes normal.     Reflex Scores:      Patellar reflexes are 1+ on the right side and 1+ on the left side. Psychiatric:        Attention and Perception: Attention normal.        Mood and Affect: Mood normal.        Speech: Speech normal.        Behavior: Behavior normal.        Thought Content: Thought content normal.        Judgment: Judgment normal.          Patient has been counseled extensively about nutrition and exercise as well as the importance of adherence with medications and regular follow-up. The patient was given clear instructions to go to ER or return to medical center if symptoms don't improve, worsen or new problems develop. The patient verbalized understanding.   Follow-up: Return in about 3 months (around 04/27/2023).   Claiborne Rigg, FNP-BC [facility] [place], [place] [phone]   01/25/2023, 6:13 PM

## 2023-01-28 ENCOUNTER — Other Ambulatory Visit: Payer: Self-pay

## 2023-01-30 ENCOUNTER — Inpatient Hospital Stay: Payer: Medicare Other

## 2023-01-30 ENCOUNTER — Other Ambulatory Visit: Payer: Self-pay

## 2023-01-30 DIAGNOSIS — E538 Deficiency of other specified B group vitamins: Secondary | ICD-10-CM | POA: Diagnosis not present

## 2023-01-30 MED ORDER — CYANOCOBALAMIN 1000 MCG/ML IJ SOLN
1000.0000 ug | Freq: Once | INTRAMUSCULAR | Status: AC
  Administered 2023-01-30: 1000 ug via INTRAMUSCULAR
  Filled 2023-01-30: qty 1

## 2023-02-06 ENCOUNTER — Inpatient Hospital Stay: Payer: Medicare Other

## 2023-02-06 ENCOUNTER — Other Ambulatory Visit: Payer: Self-pay

## 2023-02-06 VITALS — BP 131/74 | HR 75 | Temp 98.3°F | Resp 18

## 2023-02-06 DIAGNOSIS — E538 Deficiency of other specified B group vitamins: Secondary | ICD-10-CM | POA: Diagnosis not present

## 2023-02-06 MED ORDER — CYANOCOBALAMIN 1000 MCG/ML IJ SOLN
1000.0000 ug | Freq: Once | INTRAMUSCULAR | Status: AC
  Administered 2023-02-06: 1000 ug via INTRAMUSCULAR
  Filled 2023-02-06: qty 1

## 2023-02-07 ENCOUNTER — Other Ambulatory Visit: Payer: Self-pay

## 2023-02-13 ENCOUNTER — Inpatient Hospital Stay: Payer: Medicare Other

## 2023-02-13 DIAGNOSIS — E538 Deficiency of other specified B group vitamins: Secondary | ICD-10-CM | POA: Diagnosis not present

## 2023-02-13 MED ORDER — CYANOCOBALAMIN 1000 MCG/ML IJ SOLN
1000.0000 ug | Freq: Once | INTRAMUSCULAR | Status: AC
  Administered 2023-02-13: 1000 ug via INTRAMUSCULAR
  Filled 2023-02-13: qty 1

## 2023-02-13 NOTE — Patient Instructions (Signed)
 Vitamin B12 Injection What is this medication? Vitamin B12 (VAHY tuh min B12) prevents and treats low vitamin B12 levels in your body. It is used in people who do not get enough vitamin B12 from their diet or when their digestive tract does not absorb enough. Vitamin B12 plays an important role in maintaining the health of your nervous system and red blood cells. This medicine may be used for other purposes; ask your health care provider or pharmacist if you have questions. COMMON BRAND NAME(S): B-12 Compliance Kit, B-12 Injection Kit, Cyomin, Dodex, LA-12, Nutri-Twelve, Physicians EZ Use B-12, Primabalt, Vitamin Deficiency Injectable System - B12 What should I tell my care team before I take this medication? They need to know if you have any of these conditions: Kidney disease Leber's disease Megaloblastic anemia An unusual or allergic reaction to cyanocobalamin, cobalt, other medications, foods, dyes, or preservatives Pregnant or trying to get pregnant Breast-feeding How should I use this medication? This medication is injected into a muscle or deeply under the skin. It is usually given in a clinic or care team's office. However, your care team may teach you how to inject yourself. Follow all instructions. Talk to your care team about the use of this medication in children. Special care may be needed. Overdosage: If you think you have taken too much of this medicine contact a poison control center or emergency room at once. NOTE: This medicine is only for you. Do not share this medicine with others. What if I miss a dose? If you are given your dose at a clinic or care team's office, call to reschedule your appointment. If you give your own injections, and you miss a dose, take it as soon as you can. If it is almost time for your next dose, take only that dose. Do not take double or extra doses. What may interact with this medication? Alcohol Colchicine This list may not describe all possible  interactions. Give your health care provider a list of all the medicines, herbs, non-prescription drugs, or dietary supplements you use. Also tell them if you smoke, drink alcohol, or use illegal drugs. Some items may interact with your medicine. What should I watch for while using this medication? Visit your care team regularly. You may need blood work done while you are taking this medication. You may need to follow a special diet. Talk to your care team. Limit your alcohol intake and avoid smoking to get the best benefit. What side effects may I notice from receiving this medication? Side effects that you should report to your care team as soon as possible: Allergic reactions--skin rash, itching, hives, swelling of the face, lips, tongue, or throat Swelling of the ankles, hands, or feet Trouble breathing Side effects that usually do not require medical attention (report to your care team if they continue or are bothersome): Diarrhea This list may not describe all possible side effects. Call your doctor for medical advice about side effects. You may report side effects to FDA at 1-800-FDA-1088. Where should I keep my medication? Keep out of the reach of children. Store at room temperature between 15 and 30 degrees C (59 and 85 degrees F). Protect from light. Throw away any unused medication after the expiration date. NOTE: This sheet is a summary. It may not cover all possible information. If you have questions about this medicine, talk to your doctor, pharmacist, or health care provider.  2024 Elsevier/Gold Standard (2020-12-13 00:00:00)

## 2023-02-20 ENCOUNTER — Encounter: Payer: Self-pay | Admitting: Hematology

## 2023-02-20 ENCOUNTER — Other Ambulatory Visit: Payer: Self-pay

## 2023-02-20 ENCOUNTER — Inpatient Hospital Stay: Payer: Medicare Other | Attending: Hematology | Admitting: Hematology

## 2023-02-20 ENCOUNTER — Inpatient Hospital Stay: Payer: Medicare Other

## 2023-02-20 VITALS — BP 143/72 | HR 82 | Temp 98.0°F | Resp 18

## 2023-02-20 DIAGNOSIS — E538 Deficiency of other specified B group vitamins: Secondary | ICD-10-CM | POA: Insufficient documentation

## 2023-02-20 DIAGNOSIS — C775 Secondary and unspecified malignant neoplasm of intrapelvic lymph nodes: Secondary | ICD-10-CM | POA: Insufficient documentation

## 2023-02-20 DIAGNOSIS — Z79899 Other long term (current) drug therapy: Secondary | ICD-10-CM | POA: Diagnosis not present

## 2023-02-20 DIAGNOSIS — C61 Malignant neoplasm of prostate: Secondary | ICD-10-CM | POA: Diagnosis not present

## 2023-02-20 DIAGNOSIS — G629 Polyneuropathy, unspecified: Secondary | ICD-10-CM | POA: Diagnosis not present

## 2023-02-20 MED ORDER — CYANOCOBALAMIN 1000 MCG/ML IJ SOLN
1000.0000 ug | Freq: Once | INTRAMUSCULAR | Status: AC
  Administered 2023-02-20: 1000 ug via INTRAMUSCULAR
  Filled 2023-02-20: qty 1

## 2023-02-20 NOTE — Progress Notes (Signed)
[facility]   [phone] Fax:([phone]) [phone]   Clinic Follow up Note   Patient Care Team: Claiborne Rigg, NP as PCP - General (Nurse Practitioner) Cherlyn Cushing, RN as Oncology Nurse Navigator Duffy, Pascal Lux, LCSW as Social Worker (Licensed Clinical Social Worker) Malachy Mood, MD as Attending Physician (Hematology and Oncology) Ronne Binning Mardene Celeste, MD as Consulting Physician (Urology)  Date of Service:  02/20/2023  I connected with Tyler Pratt on 02/20/23 at  2:40 PM EST by telephone and verified that I am speaking with the correct person using two identifiers.   I discussed the limitations, risks, security and privacy concerns of performing an evaluation and management service by telephone and the availability of in person appointments. I also discussed with the patient that there may be a patient responsible charge related to this service. The patient expressed understanding and agreed to proceed.   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  -staging CT showed enlarged abdominal adenopathy, consistent with metastatic disease. -He is currently on Orgovyx 120 mg daily and Xtandi 160 mg daily started in June 2023 -He has had excellent response to treatment, his PSA level has been undetectable since October 2023 -He is clinically doing very well,  and tolerates treatment well. -He will continue follow-up with his urologist Dr. Ronne Binning every 6 months who prescribes Orgovyx for him -his CT CAP w contrast on 07/05/2022 was negative for PE or other acute findings, his  previous adenopathy has resolved, indicating good response to therapy    Assessment and Plan    Metastatic Prostate Cancer Stable on Ogilvy and Xtandi. No new symptoms reported. -Continue current therapy  Neuropathy Improvement noted with B12 injections and Gabapentin 100mg  TID. Patient reports further relief with an additional Gabapentin dose. -Increase Gabapentin to 100mg -200mg  tid as tolerated.  Scrotal Abscesses Recurrence of abscesses in the scrotal area. Patient plans to consult with a urologist. -Encourage patient to schedule an appointment with urologist.  Transportation Issues Patient has lost personal transportation and is arranging for medical transport services. -Assist patient with necessary paperwork for medical transport services when provided.  B12 deficiency -He is on loading dose of B12 injection B12 injections scheduled for next week and the week after. Next appointment with this provider on April 02, 2023.   Plan -Continue B12 injection as scheduled Continue Edd Fabian and Xtandi -Next follow-up scheduled for December 17        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 lymph node [facility]) Staging form: Prostate, AJCC 8th Edition - Clinical stage from 10/16/2021: Stage IVB (cT1c, cN1, cM1b, PSA: 1523, Grade Group: 5) - Unsigned Histopathologic type: Adenocarcinoma, NOS Stage prefix: Initial diagnosis Prostate specific antigen (PSA) range: 20 or greater Gleason primary pattern: 4 Gleason secondary pattern: 5 Gleason score: 9 Histologic grading system: 5 grade system Number of biopsy cores examined: 12 Number of biopsy cores positive: 12 Location of positive needle core biopsies: Both sides     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  History of Present Illness   The patient, a 67 year old male with a history of metastatic prostate cancer and neuropathy, reports an improvement in his neuropathy symptoms since starting B12 shots and gabapentin. The patient notes that the neuropathy is still present but has become more tolerable, allowing him to walk further distances. Despite the pain, the patient finds relief in moisturizing his feet. The patient is currently on a regimen of gabapentin, taking one capsule three times a day. He reports that an accidental intake of an extra capsule one day seemed to provide additional relief.  In addition to his neuropathy, the patient mentions the recurrence of abscesses in his scrotum area, which he had experienced previously. The patient is unsure if this is a side effect of his current medication. The patient also mentions a recent loss of transportation, which has made attending doctor's appointments more challenging. He is in the process of arranging transportation services for future appointments.         All other systems were reviewed with the patient and are negative.  MEDICAL HISTORY:  Past Medical History:  Diagnosis Date   High cholesterol    Hypertension    Prostate CA [facility])     SURGICAL HISTORY: No past surgical history on file.  I have reviewed the social history and family history with the patient and they are unchanged from previous note.  ALLERGIES:  has No Known Allergies.  MEDICATIONS:  Current Outpatient Medications  Medication Sig Dispense Refill   amLODipine (NORVASC) 10 MG tablet Take 1 tablet (10 mg total) by mouth daily. 90 tablet 1   diclofenac Sodium (VOLTAREN) 1 % GEL Apply 4 g topically 4 (four) times daily. 200 g 6   enzalutamide (XTANDI) 40 MG tablet Take 4 tablets (160 mg total) by mouth daily. 120 tablet 1   ezetimibe (ZETIA) 10 MG tablet Take 1  tablet (10 mg total) by mouth daily. 90 tablet 3   gabapentin (NEURONTIN) 100 MG capsule Take 1 capsule (100 mg total) by mouth 3 (three) times daily. 90 capsule 3   losartan-hydrochlorothiazide (HYZAAR) 50-12.5 MG tablet Take 1 tablet by mouth daily. 90 tablet 1   nicotine (NICODERM CQ - DOSED IN MG/24 HOURS) 14 mg/24hr patch Place 1 patch (14 mg total) onto the skin daily. 42 patch 0   Relugolix (ORGOVYX) 120 MG TABS Take 1 tablet (120 mg total) by mouth daily. 30 tablet 11   rosuvastatin (CRESTOR) 20 MG tablet Take 1 tablet (20 mg total) by mouth daily. 90 tablet 3   trazodone (DESYREL) 300 MG tablet Take 1 tablet (300 mg total) by mouth at bedtime. 90 tablet 1   No current facility-administered medications for this visit.    PHYSICAL EXAMINATION: ECOG PERFORMANCE STATUS: 1 - Symptomatic but completely ambulatory  There were no vitals filed for this visit. Wt Readings from Last 3 Encounters:  01/25/23 172 lb 9.6 oz (78.3 kg)  01/15/23 175 lb (79.4 kg)  01/10/23 175 lb (79.4 kg)      LABORATORY DATA:  I have reviewed the data as listed    Latest Ref Rng & Units 01/10/2023    1:32 PM 12/24/2022    2:12 PM 09/20/2022   12:56 PM  CBC  WBC 4.0 - 10.5 K/uL 8.7  13.1  9.3  Hemoglobin 13.0 - 17.0 g/dL 16.1  09.6  04.5   Hematocrit 39.0 - 52.0 % 32.7  34.1  35.1   Platelets 150 - 400 K/uL 388  378  338         Latest Ref Rng & Units 01/10/2023    1:32 PM 12/24/2022    2:12 PM 09/20/2022   12:56 PM  CMP  Glucose 70 - 99 mg/dL 409  811  914   BUN 8 - 23 mg/dL 12  16  11    Creatinine 0.61 - 1.24 mg/dL 7.82  9.56  2.13   Sodium 135 - 145 mmol/L 139  135  140   Potassium 3.5 - 5.1 mmol/L 4.1  3.8  4.4   Chloride 98 - 111 mmol/L 107  99  104   CO2 22 - 32 mmol/L 26  25  29    Calcium 8.9 - 10.3 mg/dL 8.8  8.9  9.5   Total Protein 6.5 - 8.1 g/dL 6.7   6.9   Total Bilirubin 0.3 - 1.2 mg/dL 0.3   0.4   Alkaline Phos 38 - 126 U/L 89   82   AST 15 - 41 U/L 11   11   ALT 0 - 44 U/L 6   9        RADIOGRAPHIC STUDIES: I have personally reviewed the radiological images as listed and agreed with the findings in the report. No results found.    No orders of the defined types were placed in this encounter.  I discussed the assessment and treatment plan with the patient. The patient was provided an opportunity to ask questions and all were answered. The patient agreed with the plan and demonstrated an understanding of the instructions.   The patient was advised to call back or seek an in-person evaluation if the symptoms worsen or if the condition fails to improve as anticipated.  I provided 12 minutes of non face-to-face telephone visit time during this encounter, and > 50% was spent counseling as documented under my assessment & plan.   Malachy Mood, MD 02/20/2023

## 2023-02-20 NOTE — Assessment & Plan Note (Signed)
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  -staging CT showed enlarged abdominal adenopathy, consistent with metastatic disease. -He is currently on Orgovyx 120 mg daily and Xtandi 160 mg daily started in June 2023 -He has had excellent response to treatment, his PSA level has been undetectable since October 2023 -He is clinically doing very well,  and tolerates treatment well. -He will continue follow-up with his urologist Dr. Ronne Binning every 6 months who prescribes Orgovyx for him -his CT CAP w contrast on 07/05/2022 was negative for PE or other acute findings, his previous adenopathy has resolved, indicating good response to therapy

## 2023-02-27 ENCOUNTER — Inpatient Hospital Stay: Payer: Medicare Other

## 2023-02-27 VITALS — BP 133/74 | HR 90 | Temp 97.9°F | Resp 18

## 2023-02-27 DIAGNOSIS — E538 Deficiency of other specified B group vitamins: Secondary | ICD-10-CM

## 2023-02-27 MED ORDER — CYANOCOBALAMIN 1000 MCG/ML IJ SOLN
1000.0000 ug | Freq: Once | INTRAMUSCULAR | Status: AC
  Administered 2023-02-27: 1000 ug via INTRAMUSCULAR
  Filled 2023-02-27: qty 1

## 2023-03-06 ENCOUNTER — Other Ambulatory Visit: Payer: Self-pay

## 2023-03-06 ENCOUNTER — Inpatient Hospital Stay: Payer: Medicare Other

## 2023-03-06 VITALS — BP 135/83 | HR 97 | Temp 98.3°F | Resp 18

## 2023-03-06 DIAGNOSIS — E538 Deficiency of other specified B group vitamins: Secondary | ICD-10-CM | POA: Diagnosis not present

## 2023-03-06 DIAGNOSIS — C775 Secondary and unspecified malignant neoplasm of intrapelvic lymph nodes: Secondary | ICD-10-CM

## 2023-03-06 DIAGNOSIS — C61 Malignant neoplasm of prostate: Secondary | ICD-10-CM

## 2023-03-06 MED ORDER — CYANOCOBALAMIN 1000 MCG/ML IJ SOLN
1000.0000 ug | Freq: Once | INTRAMUSCULAR | Status: AC
  Administered 2023-03-06: 1000 ug via INTRAMUSCULAR
  Filled 2023-03-06: qty 1

## 2023-03-06 NOTE — Progress Notes (Signed)
Verbal order w/readback from Dr. Mosetta Putt for referral to [facility].  Order placed.

## 2023-03-07 ENCOUNTER — Inpatient Hospital Stay (HOSPITAL_BASED_OUTPATIENT_CLINIC_OR_DEPARTMENT_OTHER): Payer: Medicare Other | Admitting: Genetic Counselor

## 2023-03-07 ENCOUNTER — Other Ambulatory Visit: Payer: Self-pay | Admitting: Genetic Counselor

## 2023-03-07 DIAGNOSIS — Z8042 Family history of malignant neoplasm of prostate: Secondary | ICD-10-CM

## 2023-03-07 DIAGNOSIS — C61 Malignant neoplasm of prostate: Secondary | ICD-10-CM | POA: Diagnosis not present

## 2023-03-07 DIAGNOSIS — C419 Malignant neoplasm of bone and articular cartilage, unspecified: Secondary | ICD-10-CM | POA: Diagnosis not present

## 2023-03-07 DIAGNOSIS — C775 Secondary and unspecified malignant neoplasm of intrapelvic lymph nodes: Secondary | ICD-10-CM | POA: Diagnosis not present

## 2023-03-07 DIAGNOSIS — Z1379 Encounter for other screening for genetic and chromosomal anomalies: Secondary | ICD-10-CM

## 2023-03-08 ENCOUNTER — Encounter: Payer: Self-pay | Admitting: Hematology

## 2023-03-08 ENCOUNTER — Encounter: Payer: Self-pay | Admitting: Genetic Counselor

## 2023-03-08 NOTE — Progress Notes (Signed)
REFERRING PROVIDER: Malachy Mood, MD  PRIMARY PROVIDER:  Claiborne Rigg, NP  PRIMARY REASON FOR VISIT:  1. 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 Family history of prostate cancer    HISTORY OF PRESENT ILLNESS:   Tyler Pratt, a 67 y.o. male, was seen for a [facility] cancer genetics consultation at the request of Dr. Mosetta Putt due to a personal and family history of cancer.  Tyler Pratt presents to clinic today to discuss the possibility of a hereditary predisposition to cancer, to discuss genetic testing, and to further clarify his future cancer risks, as well as potential cancer risks for family members.   Tyler Pratt was diagnosed with metastatic prostate cancer at age 21.  I connected with Tyler Pratt on 03/07/2023 at 10:00am EDT by telephone and verified that I am speaking with the correct person using two identifiers.   Patient location: Home- [place], [place] Provider location: [facility] [place], [place]  CANCER HISTORY:  Oncology History Overview Note   Cancer Staging  Prostate cancer metastatic to intrapelvic lymph node [facility]) Staging form: Prostate, AJCC 8th Edition - Clinical stage from 10/16/2021: Stage IVB (cT1c, cN1, cM1b, PSA: 1523, Grade Group: 5) - Unsigned Histopathologic type: Adenocarcinoma, NOS Stage prefix: Initial diagnosis Prostate specific antigen (PSA) range: 20 or greater Gleason primary pattern: 4 Gleason secondary pattern: 5 Gleason score: 9 Histologic grading system: 5 grade system Number of biopsy cores examined: 12 Number of biopsy cores positive: 12 Location of positive needle core biopsies: Both sides     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  Social History   Socioeconomic History   Marital status: Single    Spouse name: Not on file   Number of children: Not on file   Years of education: Not on file   Highest education level: Not on file  Occupational History   Not on file  Tobacco Use   Smoking status: Every Day    Current packs/day: 1.00    Average packs/day: 1 pack/day for 40.0 years (40.0 ttl pk-yrs)    Types: Cigarettes   Smokeless tobacco: Never  Vaping Use   Vaping status: Never Used  Substance and Sexual Activity   Alcohol use: Not Currently    Comment: weekly    Drug use: Not Currently    Types: Cocaine    Comment: crack   Sexual activity: Not on file  Other Topics Concern   Not on file  Social History Narrative   Not on file   Social Determinants of Health   Financial Resource Strain: Low Risk  (01/15/2023)   Overall Financial Resource Strain (CARDIA)    Difficulty of Paying Living Expenses: Not hard at all  Food Insecurity: No Food Insecurity (01/15/2023)   Hunger Vital Sign    Worried About Running Out of Food in the Last Year: Never true    Ran Out of Food in the Last Year: Never true  Transportation Needs: No Transportation Needs (01/15/2023)   PRAPARE - Administrator, Civil Service (Medical): No    Lack of Transportation (Non-Medical): No  Physical  Activity: Inactive (01/15/2023)   Exercise Vital Sign    Days of Exercise per Week: 0 days    Minutes of Exercise per Session: 0 min  Stress: No Stress Concern Present (01/15/2023)   [name] of Occupational Health - Occupational Stress Questionnaire    Feeling of Stress : Not at all  Social Connections: Moderately Isolated (01/15/2023)   Social Connection and Isolation Panel [NHANES]    Frequency of Communication with Friends and Family: More than three times a week    Frequency of Social Gatherings with Friends and Family: Three times a week    Attends Religious Services: 1 to 4  times per year    Active Member of Clubs or Organizations: No    Attends Banker Meetings: Never    Marital Status: Never married     FAMILY HISTORY:  We obtained a detailed, 4-generation family history.  Significant diagnoses are listed below: Family History  Problem Relation Age of Onset   Hypertension Mother    Lung cancer Mother 27 - 70       smoked   Hypertension Father    Cancer Father        blood cancer   Prostate cancer Paternal Uncle        metastatic   Diabetes Maternal Grandmother    Heart disease Paternal Grandfather      Tyler Pratt is unaware of previous family history of genetic testing for hereditary cancer risks. There is no reported Ashkenazi Jewish ancestry.   GENETIC COUNSELING ASSESSMENT: Tyler Pratt is a 67 y.o. male with a personal and family history of cancer which is somewhat suggestive of a hereditary predisposition to cancer. We, therefore, discussed and recommended the following at today's visit.   DISCUSSION: We discussed that 5 - 10% of cancer is hereditary, with most cases of prostate cancer associated with BRCA1/2.  There are other genes that can be associated with hereditary prostate cancer syndromes.  We discussed that testing is beneficial for several reasons including knowing how to follow individuals after completing their treatment, identifying whether potential treatment options would be beneficial, and understanding if other family members could be at risk for cancer and allowing them to undergo genetic testing.   We reviewed the characteristics, features and inheritance patterns of hereditary cancer syndromes. We also discussed genetic testing, including the appropriate family members to test, the process of testing, insurance coverage and turn-around-time for results. We discussed the implications of a negative, positive, carrier and/or variant of uncertain significant result.   Tyler Pratt  was offered a common hereditary cancer panel  (39 genes) and an expanded pan-cancer panel (76 genes). Tyler Pratt was informed of the benefits and limitations of each panel, including that expanded pan-cancer panels contain genes that do not have clear management guidelines at this point in time.  We also discussed that as the number of genes included on a panel increases, the chances of variants of uncertain significance increases. After considering the benefits and limitations of each gene panel, Tyler Pratt elected to have [facility] Cancer[facility] Panel.  The [facility] CancerNext+RNAinsight Panel includes sequencing, rearrangement analysis, and RNA analysis for the following 39 genes: APC, ATM, BAP1, BARD1, BMPR1A, BRCA1, BRCA2, BRIP1, CDH1, CDKN2A, CHEK2, FH, FLCN, MET, MLH1, MSH2, MSH6, MUTYH, NF1, NTHL1, PALB2, PMS2, PTEN, RAD51C, RAD51D, SMAD4, STK11, TP53, TSC1, TSC2, and VHL (sequencing and deletion/duplication); AXIN2, HOXB13, MBD4, MSH3, POLD1 and POLE (sequencing only); EPCAM and GREM1 (deletion/duplication only).   Based on Tyler Pratt's personal  and family history of cancer, he meets medical criteria for genetic testing. Despite that he meets criteria, he may still have an out of pocket cost. We discussed that if his out of pocket cost for testing is over $100, the laboratory should contact them to discuss self-pay prices, patient pay assistance programs, if applicable, and other billing options.  PLAN: After considering the risks, benefits, and limitations, Tyler Pratt provided informed consent to pursue genetic testing and the blood sample will be collected on 03/11/2023 and will be sent to [facility] for analysis of the CancerNext Panel. Results should be available within approximately 2-3 weeks' time, at which point they will be disclosed by telephone to Tyler Pratt, as will any additional recommendations warranted by these results. Tyler Pratt will receive a summary of his genetic counseling visit and a copy of his results once available. This  information will also be available in Epic.   Tyler Pratt questions were answered to his satisfaction today. Our contact information was provided should additional questions or concerns arise. Thank you for the referral and allowing [place] to share in the care of your patient.   Lalla Brothers, MS, [facility] Genetic Counselor [email or web address] (P) [phone]  The patient was seen for a total of 20 minutes in telephone genetic counseling. The patient was seen alone.  Drs. Pamelia Hoit and/or Mosetta Putt were available to discuss this case as needed.   _______________________________________________________________________ For Office Staff:  Number of people involved in session: 1 Was an Intern/ student involved with case: no

## 2023-03-11 ENCOUNTER — Encounter: Payer: Self-pay | Admitting: Genetic Counselor

## 2023-03-11 ENCOUNTER — Other Ambulatory Visit: Payer: Self-pay

## 2023-03-11 ENCOUNTER — Inpatient Hospital Stay: Payer: Medicare Other

## 2023-03-11 DIAGNOSIS — E538 Deficiency of other specified B group vitamins: Secondary | ICD-10-CM | POA: Diagnosis not present

## 2023-03-11 DIAGNOSIS — C61 Malignant neoplasm of prostate: Secondary | ICD-10-CM

## 2023-03-11 DIAGNOSIS — Z1379 Encounter for other screening for genetic and chromosomal anomalies: Secondary | ICD-10-CM

## 2023-03-11 LAB — CBC WITH DIFFERENTIAL (CANCER CENTER ONLY)
Abs Immature Granulocytes: 0.03 10*3/uL (ref 0.00–0.07)
Basophils Absolute: 0.1 10*3/uL (ref 0.0–0.1)
Basophils Relative: 1 %
Eosinophils Absolute: 0.1 10*3/uL (ref 0.0–0.5)
Eosinophils Relative: 1 %
HCT: 36.7 % — ABNORMAL LOW (ref 39.0–52.0)
Hemoglobin: 12.6 g/dL — ABNORMAL LOW (ref 13.0–17.0)
Immature Granulocytes: 0 %
Lymphocytes Relative: 30 %
Lymphs Abs: 2.8 10*3/uL (ref 0.7–4.0)
MCH: 31.1 pg (ref 26.0–34.0)
MCHC: 34.3 g/dL (ref 30.0–36.0)
MCV: 90.6 fL (ref 80.0–100.0)
Monocytes Absolute: 0.5 10*3/uL (ref 0.1–1.0)
Monocytes Relative: 5 %
Neutro Abs: 5.9 10*3/uL (ref 1.7–7.7)
Neutrophils Relative %: 63 %
Platelet Count: 376 10*3/uL (ref 150–400)
RBC: 4.05 MIL/uL — ABNORMAL LOW (ref 4.22–5.81)
RDW: 12.2 % (ref 11.5–15.5)
WBC Count: 9.4 10*3/uL (ref 4.0–10.5)
nRBC: 0 % (ref 0.0–0.2)

## 2023-03-11 LAB — CMP (CANCER CENTER ONLY)
ALT: 8 U/L (ref 0–44)
AST: 10 U/L — ABNORMAL LOW (ref 15–41)
Albumin: 4.3 g/dL (ref 3.5–5.0)
Alkaline Phosphatase: 83 U/L (ref 38–126)
Anion gap: 7 (ref 5–15)
BUN: 10 mg/dL (ref 8–23)
CO2: 26 mmol/L (ref 22–32)
Calcium: 9.6 mg/dL (ref 8.9–10.3)
Chloride: 104 mmol/L (ref 98–111)
Creatinine: 0.93 mg/dL (ref 0.61–1.24)
GFR, Estimated: >60 mL/min (ref >60)
Glucose, Bld: 105 mg/dL — ABNORMAL HIGH (ref 70–99)
Potassium: 4.2 mmol/L (ref 3.5–5.1)
Sodium: 137 mmol/L (ref 135–145)
Total Bilirubin: 0.4 mg/dL (ref <1.2)
Total Protein: 7.2 g/dL (ref 6.5–8.1)

## 2023-03-11 LAB — GENETIC SCREENING ORDER

## 2023-03-12 LAB — PROSTATE-SPECIFIC AG, SERUM (LABCORP): Prostate Specific Ag, Serum: <0.1 ng/mL (ref 0.0–4.0)

## 2023-03-31 ENCOUNTER — Other Ambulatory Visit: Payer: Self-pay | Admitting: Hematology

## 2023-03-31 DIAGNOSIS — E538 Deficiency of other specified B group vitamins: Secondary | ICD-10-CM

## 2023-03-31 NOTE — Assessment & Plan Note (Signed)
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  -staging CT showed enlarged abdominal adenopathy, consistent with metastatic disease. -He is currently on Orgovyx 120 mg daily and Xtandi 160 mg daily started in June 2023 -He has had excellent response to treatment, his PSA level has been undetectable since October 2023 -He is clinically doing very well,  and tolerates treatment well. -He will continue follow-up with his urologist Dr. Ronne Binning every 6 months who prescribes Orgovyx for him -his CT CAP w contrast on 07/05/2022 was negative for PE or other acute findings, his previous adenopathy has resolved, indicating good response to therapy

## 2023-04-01 ENCOUNTER — Encounter: Payer: Self-pay | Admitting: Genetic Counselor

## 2023-04-01 ENCOUNTER — Inpatient Hospital Stay: Payer: Medicare Other

## 2023-04-01 ENCOUNTER — Other Ambulatory Visit: Payer: Self-pay

## 2023-04-01 ENCOUNTER — Inpatient Hospital Stay: Payer: Medicare Other | Attending: Hematology

## 2023-04-01 ENCOUNTER — Inpatient Hospital Stay (HOSPITAL_BASED_OUTPATIENT_CLINIC_OR_DEPARTMENT_OTHER): Payer: Medicare Other | Admitting: Hematology

## 2023-04-01 ENCOUNTER — Encounter: Payer: Self-pay | Admitting: Hematology

## 2023-04-01 ENCOUNTER — Telehealth: Payer: Self-pay | Admitting: Genetic Counselor

## 2023-04-01 ENCOUNTER — Ambulatory Visit: Payer: Self-pay | Admitting: Genetic Counselor

## 2023-04-01 VITALS — BP 128/71 | HR 72 | Temp 97.3°F | Resp 16 | Wt 177.8 lb

## 2023-04-01 DIAGNOSIS — E538 Deficiency of other specified B group vitamins: Secondary | ICD-10-CM

## 2023-04-01 DIAGNOSIS — Z1379 Encounter for other screening for genetic and chromosomal anomalies: Secondary | ICD-10-CM

## 2023-04-01 DIAGNOSIS — G629 Polyneuropathy, unspecified: Secondary | ICD-10-CM | POA: Diagnosis not present

## 2023-04-01 DIAGNOSIS — Z79899 Other long term (current) drug therapy: Secondary | ICD-10-CM | POA: Diagnosis not present

## 2023-04-01 DIAGNOSIS — C61 Malignant neoplasm of prostate: Secondary | ICD-10-CM

## 2023-04-01 DIAGNOSIS — D649 Anemia, unspecified: Secondary | ICD-10-CM | POA: Insufficient documentation

## 2023-04-01 DIAGNOSIS — C775 Secondary and unspecified malignant neoplasm of intrapelvic lymph nodes: Secondary | ICD-10-CM | POA: Diagnosis present

## 2023-04-01 LAB — CMP (CANCER CENTER ONLY)
ALT: 8 U/L (ref 0–44)
AST: 13 U/L — ABNORMAL LOW (ref 15–41)
Albumin: 4.5 g/dL (ref 3.5–5.0)
Alkaline Phosphatase: 79 U/L (ref 38–126)
Anion gap: 9 (ref 5–15)
BUN: 15 mg/dL (ref 8–23)
CO2: 24 mmol/L (ref 22–32)
Calcium: 9.9 mg/dL (ref 8.9–10.3)
Chloride: 104 mmol/L (ref 98–111)
Creatinine: 0.86 mg/dL (ref 0.61–1.24)
GFR, Estimated: >60 mL/min (ref >60)
Glucose, Bld: 120 mg/dL — ABNORMAL HIGH (ref 70–99)
Potassium: 3.6 mmol/L (ref 3.5–5.1)
Sodium: 137 mmol/L (ref 135–145)
Total Bilirubin: 0.4 mg/dL (ref <1.2)
Total Protein: 7.1 g/dL (ref 6.5–8.1)

## 2023-04-01 LAB — CBC WITH DIFFERENTIAL (CANCER CENTER ONLY)
Abs Immature Granulocytes: 0.03 10*3/uL (ref 0.00–0.07)
Basophils Absolute: 0.1 10*3/uL (ref 0.0–0.1)
Basophils Relative: 1 %
Eosinophils Absolute: 0.1 10*3/uL (ref 0.0–0.5)
Eosinophils Relative: 1 %
HCT: 38.1 % — ABNORMAL LOW (ref 39.0–52.0)
Hemoglobin: 12.6 g/dL — ABNORMAL LOW (ref 13.0–17.0)
Immature Granulocytes: 0 %
Lymphocytes Relative: 28 %
Lymphs Abs: 2.3 10*3/uL (ref 0.7–4.0)
MCH: 30.1 pg (ref 26.0–34.0)
MCHC: 33.1 g/dL (ref 30.0–36.0)
MCV: 91.1 fL (ref 80.0–100.0)
Monocytes Absolute: 0.4 10*3/uL (ref 0.1–1.0)
Monocytes Relative: 5 %
Neutro Abs: 5.5 10*3/uL (ref 1.7–7.7)
Neutrophils Relative %: 65 %
Platelet Count: 301 10*3/uL (ref 150–400)
RBC: 4.18 MIL/uL — ABNORMAL LOW (ref 4.22–5.81)
RDW: 12.2 % (ref 11.5–15.5)
WBC Count: 8.5 10*3/uL (ref 4.0–10.5)
nRBC: 0 % (ref 0.0–0.2)

## 2023-04-01 LAB — VITAMIN B12: Vitamin B-12: 359 pg/mL (ref 180–914)

## 2023-04-01 MED ORDER — CYANOCOBALAMIN 1000 MCG/ML IJ SOLN
1000.0000 ug | Freq: Once | INTRAMUSCULAR | Status: AC
  Administered 2023-04-01: 1000 ug via INTRAMUSCULAR
  Filled 2023-04-01: qty 1

## 2023-04-01 MED ORDER — CYANOCOBALAMIN 1000 MCG/ML IJ SOLN
1000.0000 ug | INTRAMUSCULAR | 3 refills | Status: AC
  Filled 2023-04-01: qty 1, 30d supply, fill #0

## 2023-04-01 MED ORDER — ENZALUTAMIDE 40 MG PO TABS: 160.0000 mg | ORAL_TABLET | Freq: Every day | ORAL | 2 refills | Status: DC

## 2023-04-01 NOTE — Progress Notes (Signed)
HPI:   Tyler Pratt was previously seen in the [facility] due to a personal and family history of cancer and concerns regarding a hereditary predisposition to cancer. Please refer to our prior cancer genetics clinic note for more information regarding our discussion, assessment and recommendations, at the time. Tyler Pratt recent genetic test results were disclosed to him, as were recommendations warranted by these results. These results and recommendations are discussed in more detail below.  CANCER HISTORY:  Oncology History Overview Note   Cancer Staging  Prostate cancer metastatic to intrapelvic lymph node [facility]) Staging form: Prostate, AJCC 8th Edition - Clinical stage from 10/16/2021: Stage IVB (cT1c, cN1, cM1b, PSA: 1523, Grade Group: 5) - Unsigned Histopathologic type: Adenocarcinoma, NOS Stage prefix: Initial diagnosis Prostate specific antigen (PSA) range: 20 or greater Gleason primary pattern: 4 Gleason secondary pattern: 5 Gleason score: 9 Histologic grading system: 5 grade system Number of biopsy cores examined: 12 Number of biopsy cores positive: 12 Location of positive needle core biopsies: Both sides     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 Report date is 03/27/2023.   The [facility] CancerNext+RNAinsight Panel includes sequencing, rearrangement analysis, and RNA analysis for the following 39 genes: APC, ATM, BAP1, BARD1, BMPR1A, BRCA1, BRCA2, BRIP1, CDH1, CDKN2A, CHEK2, FH, FLCN, MET, MLH1, MSH2, MSH6, MUTYH, NF1, NTHL1, PALB2, PMS2, PTEN, RAD51C, RAD51D, SMAD4, STK11, TP53, TSC1, TSC2, and VHL (sequencing and deletion/duplication); AXIN2, HOXB13, MBD4, MSH3, POLD1 and POLE (sequencing only); EPCAM and GREM1 (deletion/duplication only).      FAMILY HISTORY:  We obtained a detailed,  4-generation family history.  Significant diagnoses are listed below:      Family History  Problem Relation Age of Onset   Hypertension Mother     Lung cancer Mother 36 - 69        smoked   Hypertension Father     Cancer Father          blood cancer   Diabetes Maternal Grandmother     Heart disease Paternal Grandfather     Prostate cancer Maternal Uncle          metastatic             Tyler Pratt is unaware of previous family history of genetic testing for hereditary cancer risks. There is no reported Ashkenazi Jewish ancestry.   GENETIC TEST RESULTS:  The [facility] Cancer[facility] Panel found no pathogenic mutations.   The [facility] CancerNext+RNAinsight Panel includes sequencing, rearrangement analysis, and RNA analysis for the following 39 genes: APC, ATM, BAP1, BARD1, BMPR1A, BRCA1, BRCA2, BRIP1, CDH1, CDKN2A, CHEK2, FH, FLCN, MET, MLH1, MSH2, MSH6, MUTYH, NF1, NTHL1, PALB2, PMS2, PTEN, RAD51C, RAD51D, SMAD4, STK11, TP53, TSC1, TSC2, and VHL (sequencing and deletion/duplication); AXIN2, HOXB13, MBD4, MSH3, POLD1 and POLE (sequencing only); EPCAM and GREM1 (deletion/duplication only).   The test report has been scanned into EPIC and is located under the Molecular Pathology section of the Results Review tab.  A portion of the result report is included below for reference. Genetic testing reported out on 03/27/2023.       Even though a pathogenic variant was not identified, possible explanations for the cancer in the family may include: There may be no hereditary risk for cancer in the family. The cancers in Tyler Pratt and/or his family may be due to other genetic  or environmental factors. There may be a gene mutation in one of these genes that current testing methods cannot detect, but that chance is small. There could be another gene that has not yet been discovered, or that we have not yet tested, that is responsible for the cancer diagnoses in the family.   Therefore, it is important to  remain in touch with cancer genetics in the future so that we can continue to offer Tyler Pratt the most up to date genetic testing.   ADDITIONAL GENETIC TESTING:  We discussed with Tyler Pratt that his genetic testing was fairly extensive.  If there are genes identified to increase cancer risk that can be analyzed in the future, we would be happy to discuss and coordinate this testing at that time.     CANCER SCREENING RECOMMENDATIONS:  Tyler Pratt test result is considered negative (normal).  This means that we have not identified a hereditary cause for his personal and family history of cancer at this time. Most cancers happen by chance and this negative test suggests that his cancer may fall into this category.    An individual's cancer risk and medical management are not determined by genetic test results alone. Overall cancer risk assessment incorporates additional factors, including personal medical history, family history, and any available genetic information that may result in a personalized plan for cancer prevention and surveillance. Therefore, it is recommended he continue to follow the cancer management and screening guidelines provided by his oncology and primary healthcare provider.  RECOMMENDATIONS FOR FAMILY MEMBERS:   Since he did not inherit a mutation in a cancer predisposition gene included on this panel, his children could not have inherited a mutation from him in one of these genes.  FOLLOW-UP:  Cancer genetics is a rapidly advancing field and it is possible that new genetic tests will be appropriate for him and/or his family members in the future. We encouraged him to remain in contact with cancer genetics on an annual basis so we can update his personal and family histories and let him know of advances in cancer genetics that may benefit this family.   Our contact number was provided. Tyler Pratt questions were answered to his satisfaction, and he knows he is welcome to call us  at anytime with additional questions or concerns.   Lalla Brothers, MS, [facility] Genetic Counselor [email or web address] (P) [phone]

## 2023-04-01 NOTE — Patient Instructions (Signed)
Vitamin B12 Deficiency Vitamin B12 deficiency occurs when the body does not have enough of this important vitamin. The body needs this vitamin: To make red blood cells. To make DNA. This is the genetic material inside cells. To help the nerves work properly so they can carry messages from the brain to the body. Vitamin B12 deficiency can cause health problems, such as not having enough red blood cells in the blood (anemia). This can lead to nerve damage if untreated. What are the causes? This condition may be caused by: Not eating enough foods that contain vitamin B12. Not having enough stomach acid and digestive fluids to properly absorb vitamin B12 from the food that you eat. Having certain diseases that make it hard to absorb vitamin B12. These diseases include Crohn's disease, chronic pancreatitis, and cystic fibrosis. An autoimmune disorder in which the body does not make enough of a protein (intrinsic factor) within the stomach, resulting in not enough absorption of vitamin B12. Having a surgery in which part of the stomach or small intestine is removed. Taking certain medicines that make it hard for the body to absorb vitamin B12. These include: Heartburn medicines, such as antacids and proton pump inhibitors. Some medicines that are used to treat diabetes. What increases the risk? The following factors may make you more likely to develop a vitamin B12 deficiency: Being an older adult. Eating a vegetarian or vegan diet that does not include any foods that come from animals. Eating a poor diet while you are pregnant. Taking certain medicines. Having alcoholism. What are the signs or symptoms? In some cases, there are no symptoms of this condition. If the condition leads to anemia or nerve damage, various symptoms may occur, such as: Weakness. Tiredness (fatigue). Loss of appetite. Numbness or tingling in your hands and feet. Redness and burning of the tongue. Depression,  confusion, or memory problems. Trouble walking. If anemia is severe, symptoms can include: Shortness of breath. Dizziness. Rapid heart rate. How is this diagnosed? This condition may be diagnosed with a blood test to measure the level of vitamin B12 in your blood. You may also have other tests, including: A group of tests that measure certain characteristics of blood cells (complete blood count, CBC). A blood test to measure intrinsic factor. A procedure where a thin tube with a camera on the end is used to look into your stomach or intestines (endoscopy). Other tests may be needed to discover the cause of the deficiency. How is this treated? Treatment for this condition depends on the cause. This condition may be treated by: Changing your eating and drinking habits, such as: Eating more foods that contain vitamin B12. Drinking less alcohol or no alcohol. Getting vitamin B12 injections. Taking vitamin B12 supplements by mouth (orally). Your health care provider will tell you which dose is best for you. Follow these instructions at home: Eating and drinking  Include foods in your diet that come from animals and contain a lot of vitamin B12. These include: Meats and poultry. This includes beef, pork, chicken, turkey, and organ meats, such as liver. Seafood. This includes clams, rainbow trout, salmon, tuna, and haddock. Eggs. Dairy foods such as milk, yogurt, and cheese. Eat foods that have vitamin B12 added to them (are fortified), such as ready-to-eat breakfast cereals. Check the label on the package to see if a food is fortified. The items listed above may not be a complete list of foods and beverages you can eat and drink. Contact a dietitian for   more information. Alcohol use Do not drink alcohol if: Your health care provider tells you not to drink. You are pregnant, may be pregnant, or are planning to become pregnant. If you drink alcohol: Limit how much you have to: 0-1 drink a  day for women. 0-2 drinks a day for men. Know how much alcohol is in your drink. In the [place] one drink equals one 12 oz bottle of beer (355 mL), one 5 oz glass of wine (148 mL), or one 1 oz glass of hard liquor (44 mL). General instructions Get vitamin B12 injections if told to by your health care provider. Take supplements only as told by your health care provider. Follow the directions carefully. Keep all follow-up visits. This is important. Contact a health care provider if: Your symptoms come back. Your symptoms get worse or do not improve with treatment. Get help right away: You develop shortness of breath. You have a rapid heart rate. You have chest pain. You become dizzy or you faint. These symptoms may be an emergency. Get help right away. Call 911. Do not wait to see if the symptoms will go away. Do not drive yourself to the hospital. Summary Vitamin B12 deficiency occurs when the body does not have enough of this important vitamin. Common causes include not eating enough foods that contain vitamin B12, not being able to absorb vitamin B12 from the food that you eat, having a surgery in which part of the stomach or small intestine is removed, or taking certain medicines. Eat foods that have vitamin B12 in them. Treatment may include making a change in the way you eat and drink, getting vitamin B12 injections, or taking vitamin B12 supplements. This information is not intended to replace advice given to you by your health care provider. Make sure you discuss any questions you have with your health care provider. Document Revised: 11/25/2020 Document Reviewed: 11/25/2020 Elsevier Patient Education  2024 Elsevier Inc.  

## 2023-04-01 NOTE — Telephone Encounter (Signed)
I contacted Mr. Aspinall to discuss his genetic testing results. No pathogenic variants were identified in the 39 genes analyzed. Detailed clinic note to follow.  The test report has been scanned into EPIC and is located under the Molecular Pathology section of the Results Review tab.  A portion of the result report is included below for reference.   Lalla Brothers, MS, [facility] Genetic Counselor [email or web address] (P) [phone]

## 2023-04-02 ENCOUNTER — Encounter: Payer: Self-pay | Admitting: Hematology

## 2023-04-02 ENCOUNTER — Ambulatory Visit: Payer: Medicare Other | Admitting: Hematology

## 2023-04-02 ENCOUNTER — Other Ambulatory Visit: Payer: Medicare Other

## 2023-04-02 LAB — INTRINSIC FACTOR ANTIBODIES: Intrinsic Factor: 1.1 [AU]/ml (ref 0.0–1.1)

## 2023-04-02 LAB — PROSTATE-SPECIFIC AG, SERUM (LABCORP): Prostate Specific Ag, Serum: <0.1 ng/mL (ref 0.0–4.0)
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
## 2023-04-03 ENCOUNTER — Encounter: Payer: Self-pay | Admitting: Hematology

## 2023-04-03 ENCOUNTER — Other Ambulatory Visit: Payer: Self-pay

## 2023-04-22 ENCOUNTER — Other Ambulatory Visit (HOSPITAL_COMMUNITY): Payer: Self-pay

## 2023-04-22 ENCOUNTER — Other Ambulatory Visit: Payer: Self-pay | Admitting: *Deleted

## 2023-04-22 ENCOUNTER — Encounter: Payer: Self-pay | Admitting: Hematology

## 2023-04-22 ENCOUNTER — Telehealth: Payer: Self-pay | Admitting: Pharmacy Technician

## 2023-04-22 DIAGNOSIS — C61 Malignant neoplasm of prostate: Secondary | ICD-10-CM

## 2023-04-22 MED ORDER — ENZALUTAMIDE 40 MG PO TABS: 160.0000 mg | ORAL_TABLET | Freq: Every day | ORAL | 2 refills | Status: DC

## 2023-04-22 NOTE — Telephone Encounter (Signed)
Oral Oncology Patient Advocate Encounter  Completed application for XTANDI in an effort to reduce patient's out of pocket expense to $0.    Application completed online and submitted to [employer].   [employer] patient assistance phone number for follow up is [phone].   This encounter will be updated until final determination.    Jinger Neighbors, CPhT-Adv Oncology Pharmacy Patient Advocate [facility] Direct Number: [phone]  Fax: [phone]

## 2023-04-25 ENCOUNTER — Encounter: Payer: Self-pay | Admitting: Hematology

## 2023-04-29 ENCOUNTER — Other Ambulatory Visit: Payer: Self-pay

## 2023-04-29 ENCOUNTER — Other Ambulatory Visit (HOSPITAL_COMMUNITY): Payer: Self-pay

## 2023-04-29 ENCOUNTER — Encounter: Payer: Self-pay | Admitting: Hematology

## 2023-04-29 ENCOUNTER — Telehealth: Payer: Self-pay | Admitting: Pharmacy Technician

## 2023-04-29 DIAGNOSIS — C61 Malignant neoplasm of prostate: Secondary | ICD-10-CM

## 2023-04-29 MED ORDER — ENZALUTAMIDE 40 MG PO TABS
160.0000 mg | ORAL_TABLET | Freq: Every day | ORAL | 2 refills | Status: DC
  Filled 2023-04-30: qty 120, 30d supply, fill #0
  Filled 2023-05-16: qty 120, 30d supply, fill #1
  Filled 2023-06-20: qty 120, 30d supply, fill #2

## 2023-04-29 NOTE — Telephone Encounter (Signed)
 Oral Oncology Patient Advocate Encounter   Was successful in securing patient an $2,000 grant from [employer] [facility]) to provide copayment coverage for Xtandi .  This will keep the out of pocket expense at $0.     I have spoken with the patient.    The billing information is as follows and has been shared with [facility].   Member ID: [id number] Group ID: [id number] RxBin: [id number] Dates of Eligibility: 04/29/23 through 04/26/24  Fund:  Prostate  Estefana Moellers, CPhT-Adv Oncology Pharmacy Patient Advocate [facility] Direct Number: [phone]  Fax: [phone]

## 2023-04-29 NOTE — Telephone Encounter (Signed)
 Oral Oncology Patient Advocate Encounter  Patient approved for [facility]. Closing application for PAP.  Jinger Neighbors, CPhT-Adv Oncology Pharmacy Patient Advocate [facility] Direct Number: [phone]  Fax: [phone]

## 2023-04-30 ENCOUNTER — Other Ambulatory Visit: Payer: Self-pay | Admitting: Pharmacy Technician

## 2023-04-30 ENCOUNTER — Other Ambulatory Visit (HOSPITAL_BASED_OUTPATIENT_CLINIC_OR_DEPARTMENT_OTHER): Payer: Self-pay

## 2023-04-30 ENCOUNTER — Other Ambulatory Visit: Payer: Self-pay

## 2023-04-30 ENCOUNTER — Ambulatory Visit: Payer: Medicare HMO | Attending: Nurse Practitioner | Admitting: Nurse Practitioner

## 2023-04-30 ENCOUNTER — Encounter: Payer: Self-pay | Admitting: Nurse Practitioner

## 2023-04-30 ENCOUNTER — Other Ambulatory Visit (HOSPITAL_COMMUNITY): Payer: Self-pay

## 2023-04-30 VITALS — BP 146/75 | HR 78 | Ht 70.0 in | Wt 182.6 lb

## 2023-04-30 DIAGNOSIS — E782 Mixed hyperlipidemia: Secondary | ICD-10-CM

## 2023-04-30 DIAGNOSIS — G629 Polyneuropathy, unspecified: Secondary | ICD-10-CM

## 2023-04-30 DIAGNOSIS — G4700 Insomnia, unspecified: Secondary | ICD-10-CM | POA: Diagnosis not present

## 2023-04-30 DIAGNOSIS — I10 Essential (primary) hypertension: Secondary | ICD-10-CM

## 2023-04-30 DIAGNOSIS — E7841 Elevated Lipoprotein(a): Secondary | ICD-10-CM

## 2023-04-30 DIAGNOSIS — E538 Deficiency of other specified B group vitamins: Secondary | ICD-10-CM

## 2023-04-30 MED ORDER — LOSARTAN POTASSIUM-HCTZ 50-12.5 MG PO TABS
1.0000 | ORAL_TABLET | Freq: Every day | ORAL | 1 refills | Status: DC
  Filled 2023-04-30: qty 90, 90d supply, fill #0
  Filled 2023-07-22 (×2): qty 90, 90d supply, fill #1

## 2023-04-30 MED ORDER — TRAZODONE HCL 300 MG PO TABS
300.0000 mg | ORAL_TABLET | Freq: Every day | ORAL | 1 refills | Status: DC
  Filled 2023-04-30: qty 90, 90d supply, fill #0
  Filled 2023-07-22: qty 90, 90d supply, fill #1

## 2023-04-30 MED ORDER — ROSUVASTATIN CALCIUM 20 MG PO TABS
20.0000 mg | ORAL_TABLET | Freq: Every day | ORAL | 3 refills | Status: DC
  Filled 2023-04-30: qty 90, 90d supply, fill #0
  Filled 2023-07-22 (×2): qty 90, 90d supply, fill #1
  Filled 2023-10-29: qty 90, 90d supply, fill #2
  Filled 2024-01-30: qty 90, 90d supply, fill #3

## 2023-04-30 MED ORDER — EZETIMIBE 10 MG PO TABS
10.0000 mg | ORAL_TABLET | Freq: Every day | ORAL | 3 refills | Status: DC
  Filled 2023-04-30: qty 90, 90d supply, fill #0
  Filled 2023-07-22: qty 90, 90d supply, fill #1
  Filled 2023-10-29: qty 90, 90d supply, fill #2
  Filled 2024-02-07: qty 90, 90d supply, fill #3

## 2023-04-30 MED ORDER — AMLODIPINE BESYLATE 10 MG PO TABS
10.0000 mg | ORAL_TABLET | Freq: Every day | ORAL | 1 refills | Status: DC
  Filled 2023-04-30: qty 90, 90d supply, fill #0
  Filled 2023-07-22: qty 90, 90d supply, fill #1

## 2023-04-30 MED ORDER — GABAPENTIN 100 MG PO CAPS
100.0000 mg | ORAL_CAPSULE | Freq: Three times a day (TID) | ORAL | 3 refills | Status: DC
  Filled 2023-04-30: qty 90, 30d supply, fill #0
  Filled 2023-07-22: qty 90, 30d supply, fill #1
  Filled 2023-10-29: qty 90, 30d supply, fill #2
  Filled 2024-02-07: qty 90, 30d supply, fill #3

## 2023-04-30 MED ORDER — CYANOCOBALAMIN 1000 MCG/ML IJ SOLN
1000.0000 ug | Freq: Once | INTRAMUSCULAR | Status: AC
  Administered 2023-04-30: 1000 ug via INTRAMUSCULAR
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
## 2023-04-30 NOTE — Addendum Note (Signed)
 Addended by: Arbie Cookey on: 04/30/2023 03:12 PM   Modules accepted: Orders

## 2023-04-30 NOTE — Progress Notes (Signed)
 Oral Chemotherapy Pharmacist Encounter  Patient was counseled under telephone encounter from 11/02/21.  Asberry Macintosh, PharmD, BCPS, BCOP Hematology/Oncology Clinical Pharmacist [facility] Oral Chemotherapy Navigation Clinics [phone] 04/30/2023 10:21 AM

## 2023-04-30 NOTE — Progress Notes (Signed)
 Specialty Pharmacy Initial Fill Coordination Note  Tyler Pratt is a 68 y.o. male contacted today regarding refills of specialty medication(s) Enzalutamide  (XTANDI ) .  Patient requested Delivery  on 05/02/23  to verified address [address]   [place] [address]   Medication will be filled on 04/30/23.   Patient is aware of $0 copayment.

## 2023-05-01 ENCOUNTER — Other Ambulatory Visit: Payer: Self-pay

## 2023-05-16 ENCOUNTER — Other Ambulatory Visit: Payer: Self-pay

## 2023-05-16 NOTE — Progress Notes (Signed)
Specialty Pharmacy Refill Coordination Note  Tyler Pratt is a 68 y.o. male contacted today regarding refills of specialty medication(s) Enzalutamide [name]   Patient requested Delivery   Delivery date: 05/24/23   Verified address: [address]   [name] [address]   Medication will be filled on 05/23/23.

## 2023-05-16 NOTE — Progress Notes (Signed)
Specialty Pharmacy Ongoing Clinical Assessment Note  Tyler Pratt is a 68 y.o. male who is being followed by the specialty pharmacy service for RxSp Oncology   Patient's specialty medication(s) reviewed today: Enzalutamide [name]   Missed doses in the last 4 weeks: No data recorded  Patient/Caregiver did not have any additional questions or concerns.   Therapeutic benefit summary: Patient is achieving benefit (04/01/23 PSA <0.1)   Adverse events/side effects summary: No adverse events/side effects   Patient's therapy is appropriate to: Continue    Goals Addressed             This Visit's Progress    Stabilization of disease       Patient is on track. Patient will maintain adherence         Follow up:  3 months  Bobette Mo Specialty Pharmacist

## 2023-05-20 ENCOUNTER — Other Ambulatory Visit: Payer: Self-pay

## 2023-05-21 ENCOUNTER — Other Ambulatory Visit: Payer: Self-pay

## 2023-05-23 ENCOUNTER — Other Ambulatory Visit: Payer: Self-pay

## 2023-06-13 ENCOUNTER — Ambulatory Visit: Payer: Self-pay | Admitting: Nurse Practitioner

## 2023-06-13 NOTE — Telephone Encounter (Signed)
 Summary: Right knee pain   Copied From CRM [id number]. Reason for Triage: Right knee pain, bumped it a month ago, and now having issues with walking

## 2023-06-18 ENCOUNTER — Telehealth: Payer: Self-pay | Admitting: Hematology

## 2023-06-18 ENCOUNTER — Encounter (HOSPITAL_COMMUNITY): Payer: Self-pay

## 2023-06-18 NOTE — Telephone Encounter (Signed)
 Unable to leave patient a voicemail in regards to appointment changes on 07/01/2023; mail out reminder was sent to patient on 06/18/2023

## 2023-06-20 ENCOUNTER — Other Ambulatory Visit: Payer: Self-pay

## 2023-06-20 NOTE — Progress Notes (Signed)
 Specialty Pharmacy Refill Coordination Note  Tyler Pratt is a 68 y.o. male contacted today regarding refills of specialty medication(s) Enzalutamide [name]   Patient requested Delivery   Delivery date: 06/27/23   Verified address: [address]   [name] [address]   Medication will be filled on 03.12.25.

## 2023-06-30 NOTE — Assessment & Plan Note (Signed)
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  -staging CT showed enlarged abdominal adenopathy, consistent with metastatic disease. -He is currently on Orgovyx 120 mg daily and Xtandi 160 mg daily started in June 2023 -He has had excellent response to treatment, his PSA level has been undetectable since October 2023 -He is clinically doing very well,  and tolerates treatment well. -He will continue follow-up with his urologist Dr. Ronne Binning every 6 months who prescribes Orgovyx for him -his CT CAP w contrast on 07/05/2022 was negative for PE or other acute findings, his previous adenopathy has resolved, indicating good response to therapy

## 2023-07-01 ENCOUNTER — Inpatient Hospital Stay: Payer: Self-pay

## 2023-07-01 ENCOUNTER — Encounter: Payer: Self-pay | Admitting: *Deleted

## 2023-07-01 ENCOUNTER — Other Ambulatory Visit: Payer: Self-pay

## 2023-07-01 ENCOUNTER — Ambulatory Visit: Payer: Medicare Other | Admitting: Hematology

## 2023-07-01 ENCOUNTER — Inpatient Hospital Stay (HOSPITAL_BASED_OUTPATIENT_CLINIC_OR_DEPARTMENT_OTHER): Payer: Self-pay | Admitting: Hematology

## 2023-07-01 ENCOUNTER — Ambulatory Visit: Payer: Medicare Other

## 2023-07-01 ENCOUNTER — Inpatient Hospital Stay: Payer: Self-pay | Attending: Hematology

## 2023-07-01 ENCOUNTER — Encounter: Payer: Self-pay | Admitting: Hematology

## 2023-07-01 ENCOUNTER — Other Ambulatory Visit: Payer: Medicare Other

## 2023-07-01 VITALS — BP 122/60 | HR 85 | Temp 98.4°F | Resp 17 | Ht 70.0 in | Wt 181.6 lb

## 2023-07-01 DIAGNOSIS — D649 Anemia, unspecified: Secondary | ICD-10-CM | POA: Insufficient documentation

## 2023-07-01 DIAGNOSIS — C61 Malignant neoplasm of prostate: Secondary | ICD-10-CM

## 2023-07-01 DIAGNOSIS — E538 Deficiency of other specified B group vitamins: Secondary | ICD-10-CM | POA: Diagnosis not present

## 2023-07-01 DIAGNOSIS — C775 Secondary and unspecified malignant neoplasm of intrapelvic lymph nodes: Secondary | ICD-10-CM | POA: Insufficient documentation

## 2023-07-01 DIAGNOSIS — Z79899 Other long term (current) drug therapy: Secondary | ICD-10-CM | POA: Insufficient documentation

## 2023-07-01 DIAGNOSIS — C419 Malignant neoplasm of bone and articular cartilage, unspecified: Secondary | ICD-10-CM

## 2023-07-01 LAB — CBC WITH DIFFERENTIAL (CANCER CENTER ONLY)
Abs Immature Granulocytes: 0.01 10*3/uL (ref 0.00–0.07)
Basophils Absolute: 0 10*3/uL (ref 0.0–0.1)
Basophils Relative: 1 %
Eosinophils Absolute: 0.1 10*3/uL (ref 0.0–0.5)
Eosinophils Relative: 1 %
HCT: 36.6 % — ABNORMAL LOW (ref 39.0–52.0)
Hemoglobin: 12.1 g/dL — ABNORMAL LOW (ref 13.0–17.0)
Immature Granulocytes: 0 %
Lymphocytes Relative: 27 %
Lymphs Abs: 1.7 10*3/uL (ref 0.7–4.0)
MCH: 30.2 pg (ref 26.0–34.0)
MCHC: 33.1 g/dL (ref 30.0–36.0)
MCV: 91.3 fL (ref 80.0–100.0)
Monocytes Absolute: 0.9 10*3/uL (ref 0.1–1.0)
Monocytes Relative: 14 %
Neutro Abs: 3.6 10*3/uL (ref 1.7–7.7)
Neutrophils Relative %: 57 %
Platelet Count: 334 10*3/uL (ref 150–400)
RBC: 4.01 MIL/uL — ABNORMAL LOW (ref 4.22–5.81)
RDW: 12.3 % (ref 11.5–15.5)
WBC Count: 6.3 10*3/uL (ref 4.0–10.5)
nRBC: 0 % (ref 0.0–0.2)

## 2023-07-01 LAB — CMP (CANCER CENTER ONLY)
ALT: 10 U/L (ref 0–44)
AST: 14 U/L — ABNORMAL LOW (ref 15–41)
Albumin: 4.5 g/dL (ref 3.5–5.0)
Alkaline Phosphatase: 95 U/L (ref 38–126)
Anion gap: 7 (ref 5–15)
BUN: 12 mg/dL (ref 8–23)
CO2: 28 mmol/L (ref 22–32)
Calcium: 9 mg/dL (ref 8.9–10.3)
Chloride: 102 mmol/L (ref 98–111)
Creatinine: 0.93 mg/dL (ref 0.61–1.24)
GFR, Estimated: >60 mL/min (ref >60)
Glucose, Bld: 120 mg/dL — ABNORMAL HIGH (ref 70–99)
Potassium: 3.7 mmol/L (ref 3.5–5.1)
Sodium: 137 mmol/L (ref 135–145)
Total Bilirubin: 0.4 mg/dL (ref 0.0–1.2)
Total Protein: 7.3 g/dL (ref 6.5–8.1)

## 2023-07-01 LAB — VITAMIN B12: Vitamin B-12: 270 pg/mL (ref 180–914)

## 2023-07-01 MED ORDER — CYANOCOBALAMIN 1000 MCG/ML IJ SOLN
1000.0000 ug | Freq: Once | INTRAMUSCULAR | Status: AC
  Administered 2023-07-01: 1000 ug via INTRAMUSCULAR
  Filled 2023-07-01: qty 1

## 2023-07-01 NOTE — Progress Notes (Signed)
 [facility]   [phone] Fax:([phone]) [phone]   Clinic Follow up Note   Patient Care Team: Claiborne Rigg, NP as PCP - General (Nurse Practitioner) Cherlyn Cushing, RN as Oncology Nurse Navigator Duffy, Pascal Lux, LCSW as Social Worker (Licensed Clinical Social Worker) Malachy Mood, MD as Attending Physician (Hematology and Oncology) Malen Gauze, MD as Consulting Physician (Urology)  Date of Service:  07/01/2023  CHIEF COMPLAINT: f/u of B12 deficiency and prostate cancer  CURRENT THERAPY:  B12 monthly injection [name] and Orgovyx   Oncology History   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  -staging CT showed enlarged abdominal adenopathy, consistent with metastatic disease. -He is currently on Orgovyx 120 mg daily and Xtandi 160 mg daily started in June 2023 -He has had excellent response to treatment, his PSA level has been undetectable since October 2023 -He is clinically doing very well,  and tolerates treatment well. -He will continue follow-up with his urologist Dr. Ronne Binning every 6 months who prescribes Orgovyx for him -his CT CAP w contrast on 07/05/2022 was negative for PE or other acute findings, his previous adenopathy has resolved, indicating good response to therapy    Assessment and Plan    Prostate cancer Currently on Ogamvi and Xtandi with no new issues. PSA level from three months ago was <0.1, indicating well-controlled cancer. Blood counts show mild anemia, likely treatment-related, with normal kidney and liver functions. - Continue Ogamvi and Avandia - Monitor PSA levels; current results pending - Follow up in three months for lab and clinical evaluation  Mild anemia Consistent mild anemia likely related  to prostate cancer treatment. - Monitor blood counts regularly  Vitamin B12 deficiency Missed last B12 injection, leading to increased fatigue. Received an intramuscular injection at a wellness clinic and prefers continued injections there. Antibody test ordered for autoimmune-related B12 deficiency. If negative, consider oral B12 supplementation, which requires monitoring B12 levels every three months. - Administer B12 injection today - Order antibody test for autoimmune-related B12 deficiency - Consider oral B12 supplementation if antibody test is negative - Monitor B12 levels every three months if oral supplementation is initiated  Plan -Lab reviewed -Will proceed to B12 injection today, and he will continue monthly at his PCPs office which is more convenient for him. -Prefers lab work and follow-up appointments on the same day due to reliance on public transportation. 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) Staging form: Prostate, AJCC 8th Edition - Clinical stage from 10/16/2021: Stage IVB (cT1c, cN1, cM1b, PSA: 1523, Grade Group: 5) - Unsigned Histopathologic type: Adenocarcinoma, NOS Stage prefix: Initial diagnosis Prostate specific antigen (PSA) range: 20 or greater Gleason primary pattern: 4 Gleason secondary pattern: 5 Gleason score: 9 Histologic grading system: 5 grade system Number of biopsy cores examined: 12 Number of biopsy cores positive: 12 Location of positive needle core biopsies: Both sides     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 Report  date is 03/27/2023.   The [facility] CancerNext+RNAinsight Panel includes  sequencing, rearrangement analysis, and RNA analysis for the following 39 genes: APC, ATM, BAP1, BARD1, BMPR1A, BRCA1, BRCA2, BRIP1, CDH1, CDKN2A, CHEK2, FH, FLCN, MET, MLH1, MSH2, MSH6, MUTYH, NF1, NTHL1, PALB2, PMS2, PTEN, RAD51C, RAD51D, SMAD4, STK11, TP53, TSC1, TSC2, and VHL (sequencing and deletion/duplication); AXIN2, HOXB13, MBD4, MSH3, POLD1 and POLE (sequencing only); EPCAM and GREM1 (deletion/duplication only).       Discussed the use of AI scribe software for clinical note transcription with the patient, who gave verbal consent to proceed.  History of Present Illness   Tyler Pratt, a 68 year old gentleman with a history of prostate cancer, presents for a routine follow-up. He reports no new or worsening symptoms related to his cancer. He mentions a recent knee injury that caused significant pain and limited his mobility for about a month, but states that it has since improved. He also mentions experiencing normal sweats.  He reports feeling generally well, but notes that he has been feeling tired. He attributes this fatigue to missing his last B12 injection, which he usually receives monthly. He states that he did not pick up his last prescription due to not feeling well. He also mentions that he had his last injection administered at a wellness clinic, as he was uncomfortable administering the intramuscular injection at home.  He confirms that he is currently taking Ogamvi, prescribed by his urologist, and Avandia. He denies any new problems related to these medications.         All other systems were reviewed with the patient and are negative.  MEDICAL HISTORY:  Past Medical History:  Diagnosis Date   High cholesterol    Hypertension    Prostate CA [facility])     SURGICAL HISTORY: No past surgical history on file.  I have reviewed the social history and family history with the patient and they are unchanged from previous note.  ALLERGIES:  has no known allergies.  MEDICATIONS:   Current Outpatient Medications  Medication Sig Dispense Refill   amLODipine (NORVASC) 10 MG tablet Take 1 tablet (10 mg total) by mouth daily. 90 tablet 1   cyanocobalamin (VITAMIN B12) 1000 MCG/ML injection Inject 1 mL (1,000 mcg total) into the muscle every 30 (thirty) days. 1 mL 3   enzalutamide (XTANDI) 40 MG tablet Take 4 tablets (160 mg total) by mouth daily. 120 tablet 2   ezetimibe (ZETIA) 10 MG tablet Take 1 tablet (10 mg total) by mouth daily. 90 tablet 3   gabapentin (NEURONTIN) 100 MG capsule Take 1 capsule (100 mg total) by mouth 3 (three) times daily. 90 capsule 3   losartan-hydrochlorothiazide (HYZAAR) 50-12.5 MG tablet Take 1 tablet by mouth daily. 90 tablet 1   Relugolix (ORGOVYX) 120 MG TABS Take 1 tablet (120 mg total) by mouth daily. 30 tablet 11   rosuvastatin (CRESTOR) 20 MG tablet Take 1 tablet (20 mg total) by mouth daily. 90 tablet 3   trazodone (DESYREL) 300 MG tablet Take 1 tablet (300 mg total) by mouth at bedtime. 90 tablet 1   No current facility-administered medications for this visit.    PHYSICAL EXAMINATION: ECOG PERFORMANCE STATUS: 0 - Asymptomatic  Vitals:   07/01/23 1322  BP: 122/60  Pulse: 85  Resp: 17  Temp: 98.4 F (36.9 C)  SpO2: 99%   Wt Readings from Last 3 Encounters:  07/01/23 181 lb 9.6 oz (82.4 kg)  04/30/23 182 lb 9.6 oz (82.8 kg)  04/01/23 177 lb 12.8 oz (  80.6 kg)     GENERAL:alert, no distress and comfortable SKIN: skin color, texture, turgor are normal, no rashes or significant lesions EYES: normal, Conjunctiva are pink and non-injected, sclera clear NECK: supple, thyroid normal size, non-tender, without nodularity LYMPH:  no palpable lymphadenopathy in the cervical, axillary  LUNGS: clear to auscultation and percussion with normal breathing effort HEART: regular rate & rhythm and no murmurs and no lower extremity edema ABDOMEN:abdomen soft, non-tender and normal bowel sounds Musculoskeletal:no cyanosis of digits and no  clubbing  NEURO: alert & oriented x 3 with fluent speech, no focal motor/sensory deficits       LABORATORY DATA:  I have reviewed the data as listed    Latest Ref Rng & Units 07/01/2023   12:52 PM 04/01/2023    1:45 PM 03/11/2023   12:57 PM  CBC  WBC 4.0 - 10.5 K/uL 6.3  8.5  9.4   Hemoglobin 13.0 - 17.0 g/dL 69.6  29.5  28.4   Hematocrit 39.0 - 52.0 % 36.6  38.1  36.7   Platelets 150 - 400 K/uL 334  301  376         Latest Ref Rng & Units 07/01/2023   12:52 PM 04/01/2023    1:45 PM 03/11/2023    1:02 PM  CMP  Glucose 70 - 99 mg/dL 132  440  102   BUN 8 - 23 mg/dL 12  15  10    Creatinine 0.61 - 1.24 mg/dL 7.25  3.66  4.40   Sodium 135 - 145 mmol/L 137  137  137   Potassium 3.5 - 5.1 mmol/L 3.7  3.6  4.2   Chloride 98 - 111 mmol/L 102  104  104   CO2 22 - 32 mmol/L 28  24  26    Calcium 8.9 - 10.3 mg/dL 9.0  9.9  9.6   Total Protein 6.5 - 8.1 g/dL 7.3  7.1  7.2   Total Bilirubin 0.0 - 1.2 mg/dL 0.4  0.4  0.4   Alkaline Phos 38 - 126 U/L 95  79  83   AST 15 - 41 U/L 14  13  10    ALT 0 - 44 U/L 10  8  8        RADIOGRAPHIC STUDIES: I have personally reviewed the radiological images as listed and agreed with the findings in the report. No results found.    No orders of the defined types were placed in this encounter.  All questions were answered. The patient knows to call the clinic with any problems, questions or concerns. No barriers to learning was detected. The total time spent in the appointment was 25 minutes.     Malachy Mood, MD 07/01/2023

## 2023-07-02 LAB — INTRINSIC FACTOR ANTIBODIES: Intrinsic Factor: 1 [AU]/ml (ref 0.0–1.1)

## 2023-07-02 LAB — PROSTATE-SPECIFIC AG, SERUM (LABCORP): Prostate Specific Ag, Serum: <0.1 ng/mL (ref 0.0–4.0)

## 2023-07-18 ENCOUNTER — Other Ambulatory Visit: Payer: Self-pay

## 2023-07-18 ENCOUNTER — Other Ambulatory Visit: Payer: Self-pay | Admitting: Hematology

## 2023-07-18 ENCOUNTER — Ambulatory Visit: Payer: Self-pay

## 2023-07-18 ENCOUNTER — Other Ambulatory Visit (HOSPITAL_COMMUNITY): Payer: Self-pay

## 2023-07-18 DIAGNOSIS — C61 Malignant neoplasm of prostate: Secondary | ICD-10-CM

## 2023-07-18 NOTE — Progress Notes (Signed)
 Specialty Pharmacy Refill Coordination Note  Tyler Pratt is a 68 y.o. male contacted today regarding refills of specialty medication(s) Enzalutamide [name]   Patient requested Delivery   Delivery date: 08/09/23   Verified address: [address]   [place] [address]   Medication will be filled on 04.24.25.   This fill date is pending response to refill request from provider. Patient is aware and if they have not received fill by intended date they must follow up with pharmacy.

## 2023-07-18 NOTE — Progress Notes (Signed)
 Specialty Pharmacy Ongoing Clinical Assessment Note  Tyler Pratt is a 68 y.o. male who is being followed by the specialty pharmacy service for RxSp Oncology   Patient's specialty medication(s) reviewed today: Enzalutamide [name]   Missed doses in the last 4 weeks: 1   Patient/Caregiver did not have any additional questions or concerns.   Therapeutic benefit summary: Patient is achieving benefit   Adverse events/side effects summary: Experienced adverse events/side effects (Patient reports ongoing fatigue and hot flashes, which are tolerable at this time)   Patient's therapy is appropriate to: Continue    Goals Addressed             This Visit's Progress    Slow Disease Progression   On track    Patient is on track. Patient will maintain adherence. Patient's PSA remains <0.1 ng/mL as of 07/01/23.          Follow up:  3 months  Servando Snare Specialty Pharmacist

## 2023-07-18 NOTE — Telephone Encounter (Signed)
 Noted.  Patient has upcoming appointment with PCP 07/30/2023

## 2023-07-18 NOTE — Telephone Encounter (Signed)
  Chief Complaint: knee injury Symptoms: pain and swelling  Disposition: [] ED /[x] Urgent Care (no appt availability in office) / [] Appointment(In office/virtual)/ []  [facility]/ [] Home Care/ [] Refused Recommended Disposition /[] [facility]/ []  Follow-up with PCP Additional Notes: Pt complaining of right knee pain. Pt bumped it over a month ago and it is still bothersome. Pt has been ace wrapping it, OTC medication, heat/ice, and it wont' get better. Pt stated it is swollen compared to other knee. Pt states pain is 8-9 when weight bearing but not pain while sitting. Pt refused appt today or tomorrow due to riding bus and having to walk to clinics. RN suggested UC. Pt said there was an UC on "route and I won't have to walk far." RN gave care advice and pt verbalized understanding.          Copied from CRM [id number]. Topic: Clinical - Red Word Triage >> Jul 18, 2023 11:19 AM Carlatta H wrote: [facility] that prompted transfer to Nurse Triage: Patient is in extreme pain in right knee//He hit it on metal railing// Reason for Disposition  [1] SEVERE pain AND [2] not improved 2 hours after pain medicine/ice packs  Answer Assessment - Initial Assessment Questions 1. MECHANISM: "How did the injury happen?" (e.g., twisting injury, direct blow)      Bumped knee over a month ago  2. ONSET: "When did the injury happen?" (Minutes or hours ago)      Over a month ago  3. LOCATION: "Where is the injury located?"      Knee  4. APPEARANCE of INJURY: "What does the injury look like?"      Swollen 5. SEVERITY: "Can you put weight on that leg?" "Can you walk?"      Hurts to walk- no weight 6. SIZE: For cuts, bruises, or swelling, ask: "How large is it?" (e.g., inches or centimeters;  entire joint)      Bigger than other knee  7. PAIN: "Is there pain?" If Yes, ask: "How bad is the pain?"  "What does it keep you from doing?" (e.g., Scale 1-10; or mild, moderate, severe)   -  NONE: (0):  no pain   -  MILD (1-3): doesn't interfere with normal activities    -  MODERATE (4-7): interferes with normal activities (e.g., work or school) or awakens from sleep, limping    -  SEVERE (8-10): excruciating pain, unable to do any normal activities, unable to walk     Severe 8-9 when walking   9. OTHER SYMPTOMS: "Do you have any other symptoms?"  (e.g., "pop" when knee injured, swelling, locking, buckling)      Knee "gives"  Protocols used: Knee Injury-A-AH

## 2023-07-19 ENCOUNTER — Ambulatory Visit (HOSPITAL_COMMUNITY)
Admission: EM | Admit: 2023-07-19 | Discharge: 2023-07-19 | Disposition: A | Discharge status: Home / Self Care | Attending: Physician Assistant | Admitting: Physician Assistant

## 2023-07-19 ENCOUNTER — Ambulatory Visit (HOSPITAL_COMMUNITY)

## 2023-07-19 ENCOUNTER — Encounter (HOSPITAL_COMMUNITY): Payer: Self-pay

## 2023-07-19 DIAGNOSIS — M25561 Pain in right knee: Secondary | ICD-10-CM

## 2023-07-19 NOTE — ED Triage Notes (Signed)
 Patient presenting with right knee pain onset 2 months ago. States he hit his knee 2 months ago and the pain has been getting progressively worse.   Prescriptions or OTC medications tried: Yes- tylenol, Ace wrap, topical pain meds    with mild relief
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
## 2023-07-19 NOTE — Discharge Instructions (Addendum)
 Your x-ray did not show signs of an acute fracture, dislocation or soft tissue swelling.  The radiologist did not note that there were signs of osteoarthritis either.  At this time it is difficult to say what is causing your knee pain but I suspect this might be more of a soft tissue injury.  I would recommend following up with orthopedics if your pain is not improving over the next week or 2. We have supplied you with a hinged knee brace to help with stability.  I recommend using this versus the Ace wrap's as it sounds like you have been struggling to apply the Ace wrap's in a way that does not decrease circulation to your lower leg. I would recommend using compression socks to assist with your lower leg swelling. Please try to wear compression stockings. Use your shoe size and then measure around the widest part of your calf to get the appropriate size stocking I typically recommend 15-25 mmHg compression force for daily wear. Please put these on first thing in the morning and wear until the evening time.  These should not be painful or cut into your legs but they may be pretty snug. If they cause pain, please take them off.  You can continue to use Tylenol for pain management as well as Voltaren gel, warm compresses to the area.  I have put in information regarding orthopedic urgent care at the end of this after visit summary for you to review.  Sometimes they can provide joint injections that may help provide relief.  [facility] [address], [address], [place], [place] [address] [phone]  [facility] - [place] [address], [place], [place] [address] [phone]

## 2023-07-22 ENCOUNTER — Other Ambulatory Visit: Payer: Self-pay

## 2023-07-22 ENCOUNTER — Ambulatory Visit: Attending: Family Medicine

## 2023-07-22 MED ORDER — ENZALUTAMIDE 40 MG PO TABS
160.0000 mg | ORAL_TABLET | Freq: Every day | ORAL | 2 refills | Status: DC
  Filled 2023-07-22: qty 120, 30d supply, fill #0
  Filled 2023-09-04: qty 120, 30d supply, fill #1
  Filled 2023-10-01 – 2023-10-14 (×2): qty 120, 30d supply, fill #2

## 2023-07-23 ENCOUNTER — Other Ambulatory Visit: Payer: Self-pay

## 2023-07-24 ENCOUNTER — Ambulatory Visit: Attending: Nurse Practitioner

## 2023-07-24 ENCOUNTER — Other Ambulatory Visit: Payer: Self-pay

## 2023-07-26 ENCOUNTER — Telehealth: Payer: Self-pay

## 2023-07-26 NOTE — Telephone Encounter (Addendum)
 Called patient to relay message below as per Santiago Glad NP, patient voiced full understanding and had no further questions at this time.   ----- Message from Pollyann Samples sent at 07/23/2023  9:24 AM EDT ----- Please let pt know labs, intrinsic factor antibody is normal which indicates this is not auto-immune related B12 deficiency. Given that he is still in low-normal range I recommend to continue injections rather than switch to oral, but since he is getting that through PCP now can defer to them. PSA remains < 0.1 which is great news.   Thanks Lacie NP

## 2023-07-29 ENCOUNTER — Inpatient Hospital Stay: Attending: Hematology

## 2023-07-29 ENCOUNTER — Encounter: Payer: Self-pay | Admitting: *Deleted

## 2023-07-29 DIAGNOSIS — C61 Malignant neoplasm of prostate: Secondary | ICD-10-CM | POA: Diagnosis not present

## 2023-07-29 DIAGNOSIS — E538 Deficiency of other specified B group vitamins: Secondary | ICD-10-CM

## 2023-07-29 DIAGNOSIS — C775 Secondary and unspecified malignant neoplasm of intrapelvic lymph nodes: Secondary | ICD-10-CM | POA: Diagnosis not present

## 2023-07-29 DIAGNOSIS — D649 Anemia, unspecified: Secondary | ICD-10-CM | POA: Diagnosis not present

## 2023-07-29 DIAGNOSIS — Z79899 Other long term (current) drug therapy: Secondary | ICD-10-CM | POA: Insufficient documentation

## 2023-07-29 MED ORDER — CYANOCOBALAMIN 1000 MCG/ML IJ SOLN
1000.0000 ug | Freq: Once | INTRAMUSCULAR | Status: AC
  Administered 2023-07-29: 1000 ug via INTRAMUSCULAR
  Filled 2023-07-29: qty 1

## 2023-07-30 ENCOUNTER — Other Ambulatory Visit: Payer: Self-pay

## 2023-07-30 ENCOUNTER — Ambulatory Visit: Payer: Medicare HMO | Attending: Nurse Practitioner | Admitting: Nurse Practitioner

## 2023-07-30 ENCOUNTER — Other Ambulatory Visit: Payer: Self-pay | Admitting: Nurse Practitioner

## 2023-07-30 ENCOUNTER — Encounter: Payer: Self-pay | Admitting: Nurse Practitioner

## 2023-07-30 VITALS — BP 137/75 | HR 75 | Resp 18 | Ht 70.5 in | Wt 186.6 lb

## 2023-07-30 DIAGNOSIS — G8929 Other chronic pain: Secondary | ICD-10-CM

## 2023-07-30 DIAGNOSIS — Z720 Tobacco use: Secondary | ICD-10-CM

## 2023-07-30 DIAGNOSIS — I10 Essential (primary) hypertension: Secondary | ICD-10-CM | POA: Diagnosis not present

## 2023-07-30 DIAGNOSIS — M25561 Pain in right knee: Secondary | ICD-10-CM | POA: Diagnosis not present

## 2023-07-30 MED ORDER — LOSARTAN POTASSIUM-HCTZ 50-12.5 MG PO TABS
1.0000 | ORAL_TABLET | Freq: Every day | ORAL | 1 refills | Status: DC
Start: 2023-07-30 — End: 2024-03-09
  Filled 2023-07-30 – 2023-10-29 (×2): qty 90, 90d supply, fill #0
  Filled 2024-01-30: qty 90, 90d supply, fill #1

## 2023-07-30 MED ORDER — AMLODIPINE BESYLATE 10 MG PO TABS
10.0000 mg | ORAL_TABLET | Freq: Every day | ORAL | 1 refills | Status: DC
  Filled 2023-07-30 – 2023-10-29 (×2): qty 90, 90d supply, fill #0
  Filled 2024-02-07: qty 90, 90d supply, fill #1
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
## 2023-08-06 ENCOUNTER — Encounter: Payer: Self-pay | Admitting: Vascular Surgery

## 2023-08-09 ENCOUNTER — Other Ambulatory Visit

## 2023-08-14 ENCOUNTER — Encounter: Payer: Self-pay | Admitting: Nurse Practitioner

## 2023-08-16 ENCOUNTER — Inpatient Hospital Stay: Admission: RE | Admit: 2023-08-16 | Source: Ambulatory Visit

## 2023-08-16 ENCOUNTER — Other Ambulatory Visit

## 2023-08-21 ENCOUNTER — Ambulatory Visit
Admission: RE | Admit: 2023-08-21 | Discharge: 2023-08-21 | Disposition: A | Discharge status: Home / Self Care | Source: Ambulatory Visit | Attending: Nurse Practitioner | Admitting: Nurse Practitioner

## 2023-08-21 DIAGNOSIS — Z122 Encounter for screening for malignant neoplasm of respiratory organs: Secondary | ICD-10-CM | POA: Diagnosis not present

## 2023-08-21 DIAGNOSIS — Z720 Tobacco use: Secondary | ICD-10-CM

## 2023-08-21 DIAGNOSIS — F17210 Nicotine dependence, cigarettes, uncomplicated: Secondary | ICD-10-CM | POA: Diagnosis not present

## 2023-08-26 ENCOUNTER — Encounter: Payer: Self-pay | Admitting: *Deleted

## 2023-08-26 ENCOUNTER — Inpatient Hospital Stay: Attending: Hematology

## 2023-08-26 DIAGNOSIS — E538 Deficiency of other specified B group vitamins: Secondary | ICD-10-CM

## 2023-08-26 MED ORDER — CYANOCOBALAMIN 1000 MCG/ML IJ SOLN
1000.0000 ug | Freq: Once | INTRAMUSCULAR | Status: AC
  Administered 2023-08-26: 1000 ug via INTRAMUSCULAR
  Filled 2023-08-26: qty 1

## 2023-08-27 ENCOUNTER — Other Ambulatory Visit: Payer: Self-pay

## 2023-08-27 ENCOUNTER — Other Ambulatory Visit (HOSPITAL_COMMUNITY): Payer: Self-pay

## 2023-08-29 ENCOUNTER — Inpatient Hospital Stay: Admission: RE | Admit: 2023-08-29 | Source: Ambulatory Visit

## 2023-08-29 ENCOUNTER — Other Ambulatory Visit

## 2023-09-02 ENCOUNTER — Telehealth: Payer: Self-pay

## 2023-09-02 ENCOUNTER — Other Ambulatory Visit: Payer: Self-pay | Admitting: Hematology

## 2023-09-02 MED ORDER — ORGOVYX 120 MG PO TABS: 120.0000 mg | ORAL_TABLET | Freq: Every day | ORAL | 5 refills | Status: DC
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
## 2023-09-03 ENCOUNTER — Telehealth: Payer: Self-pay | Admitting: Pharmacist

## 2023-09-03 DIAGNOSIS — C61 Malignant neoplasm of prostate: Secondary | ICD-10-CM

## 2023-09-03 MED ORDER — ORGOVYX 120 MG PO TABS
120.0000 mg | ORAL_TABLET | Freq: Every day | ORAL | 5 refills | Status: DC
  Filled 2023-10-22: qty 30, 30d supply, fill #0
  Filled 2023-11-22: qty 30, 30d supply, fill #1
  Filled 2023-12-30 – 2024-01-01 (×2): qty 30, 30d supply, fill #2
  Filled 2024-01-31: qty 30, 30d supply, fill #3
  Filled 2024-02-27: qty 30, 30d supply, fill #4
  Filled 2024-03-27: qty 30, 30d supply, fill #5

## 2023-09-03 NOTE — Telephone Encounter (Addendum)
 Oral Oncology Pharmacist Encounter  Received message from Dr. Maryalice Smaller that patient's Orgovyx  is going to be prescribed by our clinic going forward.   Patient has a $0 copay at this time for Orgovyx  as he has met his out of pocket cost for 2025. Orgovyx  prescription has been redirected to [facility] for dispensing for this fill and going forward.   Called and informed Tyler Pratt of the above. He stated he currently has a full bottle of Orgovyx  on hand at home (no doses missed). Since he has a 30 day supply on hand, we will have the pharmacy reach out to him the week of June 9th to set up his next fill of Orgovyx . Patient expressed appreciation.   Jude Norton, PharmD, BCPS, BCOP Hematology/Oncology Clinical Pharmacist [facility] Oral Chemotherapy [facility] Clin[facility] [phone] 09/03/2023 10:24 AM

## 2023-09-04 ENCOUNTER — Other Ambulatory Visit: Payer: Self-pay

## 2023-09-04 ENCOUNTER — Other Ambulatory Visit: Payer: Self-pay | Admitting: Pharmacy Technician

## 2023-09-04 NOTE — Progress Notes (Signed)
 Specialty Pharmacy Refill Coordination Note  Tyler Pratt is a 68 y.o. male contacted today regarding refills of specialty medication(s) Enzalutamide  (XTANDI )   Patient requested Delivery   Delivery date: 09/11/23   Verified address: [address]  [place]   Medication will be filled on 09/10/23.

## 2023-09-15 ENCOUNTER — Ambulatory Visit: Payer: Self-pay | Admitting: Nurse Practitioner

## 2023-09-20 ENCOUNTER — Encounter: Payer: Self-pay | Admitting: Hematology

## 2023-09-26 ENCOUNTER — Other Ambulatory Visit: Payer: Self-pay | Admitting: Nurse Practitioner

## 2023-09-26 DIAGNOSIS — E538 Deficiency of other specified B group vitamins: Secondary | ICD-10-CM

## 2023-09-26 DIAGNOSIS — C61 Malignant neoplasm of prostate: Secondary | ICD-10-CM

## 2023-09-26 NOTE — Assessment & Plan Note (Addendum)
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  -staging CT showed enlarged abdominal adenopathy, consistent with metastatic disease. -He is currently on Orgovyx  120 mg daily and Xtandi  160 mg daily started in June 2023 -He has had excellent response to treatment, his PSA level has been undetectable since October 2023 -He is clinically doing very well,  and tolerates treatment well. -his CT CAP w contrast on 07/05/2022 was negative for PE or other acute findings, his previous adenopathy has resolved, indicating good response to therapy  - Continue Orgovyx  120 mg daily and Xtandi  160 mg daily.  Currently prescribed by cancer center.  Due to transportation concerns, patient no longer following up with urologist on routine basis.

## 2023-09-26 NOTE — Progress Notes (Signed)
 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  -staging CT showed enlarged abdominal adenopathy, consistent with metastatic disease. -He is currently on Orgovyx  120 mg daily and Xtandi  160 mg daily started in June 2023 -He has had excellent response to treatment, his PSA level has been undetectable since October 2023 -He is clinically doing very well,  and tolerates treatment well. -his CT CAP w contrast on 07/05/2022 was negative for PE or other acute findings, his previous adenopathy has resolved, indicating good response to therapy  - Continue Orgovyx  120 mg daily and Xtandi  160 mg daily.  Currently prescribed by cancer center.  Due to transportation concerns, patient no longer following up with urologist on routine basis.   Medication side effects Patient experiencing night sweats and hot flashes due to medication for prostate cancer.  Symptoms are manageable.  He denies new joint pain or myalgias.  He does have some peripheral neuropathy.  Taking gabapentin  to manage symptoms.  Vitamin B12 deficiency Patient now receiving monthly B12 injections at cancer center.  B12 checked today.  B12 level 287 with negative intrinsic factor.  Will continue with monthly B12 injections.  Plan Labs reviewed. -Mild and stable anemia with Hgb 11.6 and HCT  33.8. - CMP unremarkable.  - PSA <0.02. - B12 level is 287 with negative intrinsic factor. Continue Orgovyx  120 mg daily. Continue Xtandi  160 mg daily. Continue monthly B12 injections. Labs with follow-up and B12 injection in 3 months.  The patient understands the plans discussed today and is in agreement with them.  He knows to contact our office if he develops concerns prior to his next appointment.  I provided 25 minutes of face-to-face time during this encounter and > 50% was spent counseling as documented under my assessment and plan.    Tyler FORBES Lessen, NP  [facility] - A DEPT [facility] [address] [place] [address] Dept: [phone] Dept Fax: [phone]   No orders of the defined types were placed in this encounter.     CHIEF COMPLAINT:  CC: prostate cancer metastatic to intrapelvic lymph node  Current Treatment:  B12 injection monthly; Xtandi , and Orgovyx   INTERVAL HISTORY:  Tyler Pratt is here today for repeat clinical assessment. He was last seen by Dr. Lanny on 07/01/2023. PSA from 06/2023 was < 1.0. His b12 level was 270 while getting monthly B12 injections. His most recent B12 injection was 08/26/2023.  States he does have night sweats and hot flashes which are manageable.  Denies new joint pain or muscle pain.  He does have intermittent constipation.  Has bowel movements approximately every other day.  Does have mild neuropathy for which she takes gabapentin  with good results.  He denies chest pain, chest pressure, or shortness of breath. He denies headaches or visual disturbances. He denies abdominal pain, nausea, vomiting,  or changes in bowel or bladder habits.  He denies fevers or chills. He denies pain. His appetite is fair.  States that his a lot of fruits and vegetables.  His weight has been stable.  I have reviewed the past medical history, past surgical history, social history and family  history with the patient and they are unchanged from previous note.  ALLERGIES:  has no known allergies.  MEDICATIONS:  Current Outpatient Medications  Medication Sig Dispense Refill   amLODipine  (NORVASC ) 10 MG tablet Take 1 tablet (10 mg total) by mouth daily. 90 tablet 1   cyanocobalamin  (VITAMIN B12) 1000 MCG/ML injection Inject 1 mL (1,000 mcg total) into the muscle every 30 (thirty) days. 1 mL 3   enzalutamide  (XTANDI ) 40 MG tablet Take 4 tablets (160 mg total) by mouth daily. 120 tablet 2   ezetimibe  (ZETIA ) 10 MG tablet Take 1 tablet (10 mg total) by mouth daily. 90 tablet 3   gabapentin  (NEURONTIN ) 100 MG capsule Take 1 capsule (100 mg total) by mouth 3 (three) times daily. 90 capsule 3   losartan -hydrochlorothiazide  (HYZAAR ) 50-12.5 MG tablet Take 1 tablet by mouth daily. 90 tablet 1   relugolix  (ORGOVYX ) 120 MG tablet Take 1 tablet (120 mg total) by mouth daily. 30 tablet 5   rosuvastatin  (CRESTOR ) 20 MG tablet Take 1 tablet (20 mg total) by mouth daily. 90 tablet 3   trazodone  (DESYREL ) 300 MG tablet Take 1 tablet (300 mg total) by mouth at bedtime. 90 tablet 1   No current facility-administered medications for this visit.    HISTORY OF PRESENT ILLNESS:   Oncology History Overview Note   Cancer Staging  Prostate cancer metastatic to intrapelvic lymph node [facility]) Staging form: Prostate, AJCC 8th Edition - Clinical stage from 10/16/2021: Stage IVB (cT1c, cN1, cM1b, PSA: 1523, Grade Group: 5) - Unsigned Histopathologic type: Adenocarcinoma, NOS Stage prefix: Initial diagnosis Prostate specific antigen (PSA) range: 20 or greater Gleason primary pattern: 4 Gleason secondary pattern: 5 Gleason score: 9 Histologic grading system: 5 grade system Number of biopsy cores examined: 12 Number of biopsy cores positive: 12 Location of positive needle core biopsies: Both sides     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 Report date is 03/27/2023.   The [facility] CancerNext+RNAinsight Panel includes sequencing, rearrangement analysis, and RNA analysis for the following 39 genes: APC, ATM, BAP1, BARD1, BMPR1A, BRCA1, BRCA2, BRIP1, CDH1, CDKN2A, CHEK2, FH, FLCN, MET, MLH1, MSH2, MSH6, MUTYH, NF1, NTHL1, PALB2, PMS2, PTEN, RAD51C, RAD51D, SMAD4, STK11, TP53, TSC1, TSC2, and VHL (sequencing and deletion/duplication); AXIN2, HOXB13, MBD4, MSH3, POLD1 and POLE (sequencing only); EPCAM and GREM1 (deletion/duplication only).        REVIEW OF SYSTEMS:   Constitutional: Denies fevers, chills or abnormal weight loss Eyes: Denies blurriness of vision Ears, nose, mouth, throat, and face: Denies mucositis or sore throat Respiratory: Denies cough, dyspnea or wheezes Cardiovascular: Denies palpitation, chest discomfort or lower extremity swelling Gastrointestinal:  Denies nausea, heartburn or change in bowel habits. Reports intermittent constipation.  Skin: Denies abnormal skin rashes Lymphatics: Denies new lymphadenopathy or easy bruising Neurological:Denies numbness, tingling or new weaknesses Behavioral/Psych: Mood is stable, no new changes  All other systems were reviewed with the patient and are negative.   VITALS:   Today's Vitals   09/27/23 1429 09/27/23 1431  BP: 130/62   Pulse:  86   Resp: 19   Temp: 97.9 F (36.6 C)   TempSrc: Temporal   SpO2: 97%   Weight: 186 lb 3.2 oz (84.5 kg)   Height: 5' 10.5 (1.791 m)   PainSc:  0-No pain   Body mass index is 26.34 kg/m.   Wt Readings from Last 3 Encounters:  09/27/23 186 lb 3.2 oz (84.5 kg)  07/30/23 186 lb 9.6 oz (84.6 kg)  07/19/23 180 lb (81.6 kg)    Body mass index is 26.34 kg/m.  Performance status (ECOG): 1 - Symptomatic but completely ambulatory  PHYSICAL EXAM:   GENERAL:alert, no distress and comfortable SKIN: skin color,  texture, turgor are normal, no rashes or significant lesions EYES: normal, Conjunctiva are pink and non-injected, sclera clear OROPHARYNX:no exudate, no erythema and lips, buccal mucosa, and tongue normal  NECK: supple, thyroid normal size, non-tender, without nodularity LYMPH:  no palpable lymphadenopathy in the cervical, axillary or inguinal LUNGS: clear to auscultation and percussion with normal breathing effort HEART: regular rate & rhythm and no murmurs and no lower extremity edema ABDOMEN:abdomen soft, non-tender and normal bowel sounds Musculoskeletal:no cyanosis of digits and no clubbing  NEURO: alert & oriented x 3 with fluent speech, no focal motor/sensory deficits  LABORATORY DATA:  I have reviewed the data as listed    Component Value Date/Time   NA 137 09/27/2023 1407   NA 139 10/25/2021 1641   K 4.0 09/27/2023 1407   CL 102 09/27/2023 1407   CO2 28 09/27/2023 1407   GLUCOSE 139 (H) 09/27/2023 1407   BUN 11 09/27/2023 1407   BUN 17 10/25/2021 1641   CREATININE 1.04 09/27/2023 1407   CALCIUM  9.1 09/27/2023 1407   PROT 6.9 09/27/2023 1407   PROT 6.7 10/25/2021 1641   ALBUMIN 4.4 09/27/2023 1407   ALBUMIN 4.6 10/25/2021 1641   AST 11 (L) 09/27/2023 1407   ALT 8 09/27/2023 1407   ALKPHOS 85 09/27/2023 1407   BILITOT 0.3 09/27/2023 1407   GFRNONAA >60 09/27/2023 1407   GFRAA  02/16/2007 1209    >60        The eGFR has been calculated using the MDRD equation. This calculation has not been validated in all clinical     Lab Results  Component Value Date   WBC 11.0 (H) 09/27/2023   NEUTROABS 6.7 09/27/2023   HGB 11.6 (L) 09/27/2023   HCT 33.8 (L) 09/27/2023   MCV 89.7 09/27/2023   PLT 324 09/27/2023    Lab Results  Component Value Date   PSA1 <0.1 09/27/2023   PSA1 <0.1 07/01/2023   PSA1 <0.1 04/01/2023

## 2023-09-27 ENCOUNTER — Inpatient Hospital Stay (HOSPITAL_BASED_OUTPATIENT_CLINIC_OR_DEPARTMENT_OTHER): Admitting: Nurse Practitioner

## 2023-09-27 ENCOUNTER — Inpatient Hospital Stay

## 2023-09-27 ENCOUNTER — Inpatient Hospital Stay: Attending: Hematology

## 2023-09-27 ENCOUNTER — Encounter: Payer: Self-pay | Admitting: Nutrition

## 2023-09-27 VITALS — BP 130/62 | HR 86 | Temp 97.9°F | Resp 19 | Ht 70.5 in | Wt 186.2 lb

## 2023-09-27 DIAGNOSIS — E538 Deficiency of other specified B group vitamins: Secondary | ICD-10-CM | POA: Diagnosis not present

## 2023-09-27 DIAGNOSIS — C775 Secondary and unspecified malignant neoplasm of intrapelvic lymph nodes: Secondary | ICD-10-CM | POA: Diagnosis not present

## 2023-09-27 DIAGNOSIS — C419 Malignant neoplasm of bone and articular cartilage, unspecified: Secondary | ICD-10-CM

## 2023-09-27 DIAGNOSIS — D649 Anemia, unspecified: Secondary | ICD-10-CM | POA: Insufficient documentation

## 2023-09-27 DIAGNOSIS — C61 Malignant neoplasm of prostate: Secondary | ICD-10-CM | POA: Diagnosis not present

## 2023-09-27 DIAGNOSIS — Z79899 Other long term (current) drug therapy: Secondary | ICD-10-CM | POA: Insufficient documentation

## 2023-09-27 LAB — CBC WITH DIFFERENTIAL (CANCER CENTER ONLY)
Abs Immature Granulocytes: 0.03 10*3/uL (ref 0.00–0.07)
Basophils Absolute: 0.1 10*3/uL (ref 0.0–0.1)
Basophils Relative: 1 %
Eosinophils Absolute: 0.2 10*3/uL (ref 0.0–0.5)
Eosinophils Relative: 2 %
HCT: 33.8 % — ABNORMAL LOW (ref 39.0–52.0)
Hemoglobin: 11.6 g/dL — ABNORMAL LOW (ref 13.0–17.0)
Immature Granulocytes: 0 %
Lymphocytes Relative: 29 %
Lymphs Abs: 3.2 10*3/uL (ref 0.7–4.0)
MCH: 30.8 pg (ref 26.0–34.0)
MCHC: 34.3 g/dL (ref 30.0–36.0)
MCV: 89.7 fL (ref 80.0–100.0)
Monocytes Absolute: 0.8 10*3/uL (ref 0.1–1.0)
Monocytes Relative: 8 %
Neutro Abs: 6.7 10*3/uL (ref 1.7–7.7)
Neutrophils Relative %: 60 %
Platelet Count: 324 10*3/uL (ref 150–400)
RBC: 3.77 MIL/uL — ABNORMAL LOW (ref 4.22–5.81)
RDW: 12.2 % (ref 11.5–15.5)
WBC Count: 11 10*3/uL — ABNORMAL HIGH (ref 4.0–10.5)
nRBC: 0 % (ref 0.0–0.2)

## 2023-09-27 LAB — CMP (CANCER CENTER ONLY)
ALT: 8 U/L (ref 0–44)
AST: 11 U/L — ABNORMAL LOW (ref 15–41)
Albumin: 4.4 g/dL (ref 3.5–5.0)
Alkaline Phosphatase: 85 U/L (ref 38–126)
Anion gap: 7 (ref 5–15)
BUN: 11 mg/dL (ref 8–23)
CO2: 28 mmol/L (ref 22–32)
Calcium: 9.1 mg/dL (ref 8.9–10.3)
Chloride: 102 mmol/L (ref 98–111)
Creatinine: 1.04 mg/dL (ref 0.61–1.24)
GFR, Estimated: >60 mL/min (ref >60)
Glucose, Bld: 139 mg/dL — ABNORMAL HIGH (ref 70–99)
Potassium: 4 mmol/L (ref 3.5–5.1)
Sodium: 137 mmol/L (ref 135–145)
Total Bilirubin: 0.3 mg/dL (ref 0.0–1.2)
Total Protein: 6.9 g/dL (ref 6.5–8.1)

## 2023-09-27 LAB — VITAMIN B12: Vitamin B-12: 287 pg/mL (ref 180–914)

## 2023-09-27 MED ORDER — CYANOCOBALAMIN 1000 MCG/ML IJ SOLN
1000.0000 ug | Freq: Once | INTRAMUSCULAR | Status: AC
  Administered 2023-09-27: 1000 ug via INTRAMUSCULAR
  Filled 2023-09-27: qty 1

## 2023-09-28 LAB — PSA, TOTAL AND FREE
PSA, Free Pct: UNDETERMINED %
PSA, Free: <0.02 ng/mL
Prostate Specific Ag, Serum: <0.1 ng/mL (ref 0.0–4.0)

## 2023-09-28 LAB — INTRINSIC FACTOR ANTIBODIES: Intrinsic Factor: 1.1 [AU]/ml (ref 0.0–1.1)

## 2023-09-30 ENCOUNTER — Telehealth: Payer: Self-pay | Admitting: Nurse Practitioner

## 2023-09-30 ENCOUNTER — Telehealth: Payer: Self-pay | Admitting: Pharmacy Technician

## 2023-09-30 ENCOUNTER — Other Ambulatory Visit (HOSPITAL_COMMUNITY): Payer: Self-pay

## 2023-09-30 NOTE — Telephone Encounter (Signed)
 Called [facility] to cancel Orgovyx  RX so that we can fill it through [facility] next time he needs it filled.Rx has been timed out in RxTriage until 10/24/23, so that he can be onboarded to [employer].

## 2023-09-30 NOTE — Telephone Encounter (Signed)
 Scheduled appointments per 6/13 los. Talked with the patient and he is aware of all made appointments.

## 2023-10-01 ENCOUNTER — Other Ambulatory Visit: Payer: Self-pay

## 2023-10-01 NOTE — Progress Notes (Signed)
 Specialty Pharmacy Ongoing Clinical Assessment Note  Tyler Pratt is a 68 y.o. male who is being followed by the specialty pharmacy service for RxSp Oncology   Patient's specialty medication(s) reviewed today: Enzalutamide  (XTANDI )   Missed doses in the last 4 weeks: 0   Patient/Caregiver did not have any additional questions or concerns.   Therapeutic benefit summary: Patient is achieving benefit   Adverse events/side effects summary: Experienced adverse events/side effects (fatigue and hot flashes.)   Patient's therapy is appropriate to: Continue    Goals Addressed             This Visit's Progress    Slow Disease Progression   On track    Patient is on track. Patient will maintain adherence. Patient's PSA remains <0.02 ng/mL as of 09/27/23.          Follow up: 3 months  [facility]

## 2023-10-01 NOTE — Progress Notes (Signed)
 Specialty Pharmacy Refill Coordination Note  Tyler Pratt is a 68 y.o. male contacted today regarding refills of specialty medication(s) Enzalutamide  (XTANDI )   Patient requested Delivery   Delivery date: 10/08/23   Verified address: [address]  [place]   Medication will be filled on 10/07/23.

## 2023-10-02 ENCOUNTER — Telehealth: Payer: Self-pay | Admitting: Nurse Practitioner

## 2023-10-02 NOTE — Telephone Encounter (Signed)
 Called patient & spoke to patient. Verified name & DOB. Patient requested further clarification on coronary artery disease. Explained that it is a condition where the arteries supplying blood to the heart muscle narrow or become blocked, typically due to plaque buildup. Re-iterated that [name] encourages taking Zetia  & Rosuvastatin  as prescribed in order to reduce cholesterol levels and plaque buildup. Patient expressed verbal understanding of all discussed.

## 2023-10-02 NOTE — Telephone Encounter (Signed)
 Copied from CRM [id number]. Topic: General - Other >> Oct 02, 2023  2:19 PM Emylou G wrote: Reason for CRM: Pls call patient.. still had questions on lung scan after me reading the results

## 2023-10-03 ENCOUNTER — Encounter: Payer: Self-pay | Admitting: Hematology

## 2023-10-07 ENCOUNTER — Other Ambulatory Visit: Payer: Self-pay

## 2023-10-09 ENCOUNTER — Encounter: Payer: Self-pay | Admitting: Nurse Practitioner

## 2023-10-09 ENCOUNTER — Encounter: Payer: Self-pay | Admitting: Hematology

## 2023-10-14 ENCOUNTER — Other Ambulatory Visit: Payer: Self-pay

## 2023-10-14 ENCOUNTER — Other Ambulatory Visit (HOSPITAL_COMMUNITY): Payer: Self-pay

## 2023-10-14 NOTE — Progress Notes (Signed)
 Patient called in today inquiring about medication being sent out. Went ahead and put in request to be filled and ship on 6/30.

## 2023-10-17 ENCOUNTER — Encounter (HOSPITAL_COMMUNITY): Payer: Self-pay

## 2023-10-22 ENCOUNTER — Other Ambulatory Visit: Payer: Self-pay | Admitting: Pharmacy Technician

## 2023-10-22 ENCOUNTER — Other Ambulatory Visit: Payer: Self-pay

## 2023-10-22 NOTE — Progress Notes (Signed)
 Specialty Pharmacy Initiation Note   Tyler Pratt is a 68 y.o. male who will be followed by the specialty pharmacy service for RxSp Oncology    Review of administration, indication, effectiveness, safety, potential side effects, storage/disposable, and missed dose instructions occurred today for patient's specialty medication(s) Relugolix  (Orgovyx )     Patient/Caregiver did not have any additional questions or concerns.   Patient's therapy is appropriate to: Continue    Goals Addressed             This Visit's Progress    Slow Disease Progression       Patient is on track. Patient will maintain adherence.         Asberry Macintosh, PharmD, BCPS, BCOP Hematology/Oncology Clinical Pharmacist [facility] Oral Chemotherapy Navigation Clinics [phone] 10/22/2023 9:40 AM

## 2023-10-22 NOTE — Progress Notes (Signed)
 Specialty Pharmacy Initial Fill Coordination Note  Tyler Pratt is a 68 y.o. male contacted today regarding refills of specialty medication(s) Relugolix  (Orgovyx ) .  Patient requested Delivery  on 10/30/23  to verified address [address]   [place] [address]   Medication will be filled on 10/28/23.   Patient is aware of $0 copayment.

## 2023-10-25 ENCOUNTER — Inpatient Hospital Stay: Attending: Hematology

## 2023-10-25 ENCOUNTER — Other Ambulatory Visit: Payer: Self-pay

## 2023-10-25 ENCOUNTER — Encounter: Payer: Self-pay | Admitting: *Deleted

## 2023-10-25 ENCOUNTER — Telehealth: Payer: Self-pay

## 2023-10-25 DIAGNOSIS — E538 Deficiency of other specified B group vitamins: Secondary | ICD-10-CM | POA: Insufficient documentation

## 2023-10-25 MED ORDER — CYANOCOBALAMIN 1000 MCG/ML IJ SOLN
1000.0000 ug | Freq: Once | INTRAMUSCULAR | Status: AC
  Administered 2023-10-25: 1000 ug via INTRAMUSCULAR
  Filled 2023-10-25: qty 1

## 2023-10-25 NOTE — Telephone Encounter (Signed)
 Pt called requesting a food bag when he comes in today for his B12 injection.  Stated this nurse will make [employer] aware of the pt's request.

## 2023-10-28 ENCOUNTER — Telehealth: Payer: Self-pay | Admitting: Nurse Practitioner

## 2023-10-28 NOTE — Telephone Encounter (Signed)
 Called pt to confirm appt. Pt's phone is unavailable.

## 2023-10-29 ENCOUNTER — Other Ambulatory Visit: Payer: Self-pay | Admitting: Nurse Practitioner

## 2023-10-29 ENCOUNTER — Ambulatory Visit: Attending: Nurse Practitioner | Admitting: Nurse Practitioner

## 2023-10-29 ENCOUNTER — Encounter: Payer: Self-pay | Admitting: Nurse Practitioner

## 2023-10-29 ENCOUNTER — Other Ambulatory Visit: Payer: Self-pay

## 2023-10-29 VITALS — BP 115/70 | HR 79 | Resp 19 | Ht 70.5 in | Wt 182.4 lb

## 2023-10-29 DIAGNOSIS — D72829 Elevated white blood cell count, unspecified: Secondary | ICD-10-CM | POA: Diagnosis not present

## 2023-10-29 DIAGNOSIS — I10 Essential (primary) hypertension: Secondary | ICD-10-CM

## 2023-10-29 DIAGNOSIS — G4700 Insomnia, unspecified: Secondary | ICD-10-CM

## 2023-10-29 DIAGNOSIS — E7800 Pure hypercholesterolemia, unspecified: Secondary | ICD-10-CM | POA: Diagnosis not present

## 2023-10-29 MED ORDER — TRAZODONE HCL 300 MG PO TABS
300.0000 mg | ORAL_TABLET | Freq: Every day | ORAL | 1 refills | Status: DC
  Filled 2023-10-29: qty 90, 90d supply, fill #0
  Filled 2024-02-07: qty 90, 90d supply, fill #1

## 2023-10-29 NOTE — Progress Notes (Signed)
 Assessment & Plan:  Virgel was seen today for hypertension.  Diagnoses and all orders for this visit:  Primary hypertension Continue all antihypertensives as prescribed.  Reminded to bring in blood pressure log for follow  up appointment.  RECOMMENDATIONS: DASH/Mediterranean Diets are healthier choices for HTN.    Insomnia, unspecified type -     trazodone  (DESYREL ) 300 MG tablet; Take 1 tablet (300 mg total) by mouth at bedtime.  Leukocytosis, unspecified type -     CBC with Differential  Hypercholesterolemia -     Lipid panel Coronary Artery Disease Managed with Zetia  and rosuvastatin . Advised low cholesterol diet, smoking cessation, and regular cardiology follow-up. - Ensure adherence to Zetia  and rosuvastatin . - Schedule follow-up CT in May 2026 for pulmonary nodules. - Follow up with cardiologist within a year. - Encourage regular exercise.  Elevated White Blood Cell Count Recent labs show elevated count of unclear etiology, requiring further evaluation. - Recheck white blood cell count today.   Follow-up Advised follow-up with vascular specialists and scheduled follow-up visit. - Contact vascular specialists by mid-August if not heard from them. - Schedule follow-up visit in three months.  Patient has been counseled on age-appropriate routine health concerns for screening and prevention. These are reviewed and up-to-date. Referrals have been placed accordingly. Immunizations are up-to-date or declined.    Subjective:   Chief Complaint  Patient presents with   Hypertension    Tyler Pratt 68 y.o. male presents to office today for follow up to HTN  PMH: prostate Ca (followed by Oncology), HTN, prediabetes, leukcytosis, B12 deficiency Currently being followed by Oncology and Urolgy for malignant neoplasm of prostate.     HTN Blood pressure well-controlled.  He is currently taking amlodipine  10 mg daily and Hyzaar  50-12.5 mg daily. BP Readings from Last 3  Encounters:  10/29/23 115/70  09/27/23 130/62  07/30/23 137/75    The patient has a history of coronary artery disease and is currently on Zetia  and rosuvastatin .  Recent lab work indicated an elevated white blood count and a B12 deficiency.  Review of Systems  Constitutional:  Negative for fever, malaise/fatigue and weight loss.  HENT: Negative.  Negative for nosebleeds.   Eyes: Negative.  Negative for blurred vision, double vision and photophobia.  Respiratory: Negative.  Negative for cough and shortness of breath.   Cardiovascular: Negative.  Negative for chest pain, palpitations and leg swelling.  Gastrointestinal: Negative.  Negative for heartburn, nausea and vomiting.  Musculoskeletal: Negative.  Negative for myalgias.  Neurological: Negative.  Negative for dizziness, focal weakness, seizures and headaches.  Psychiatric/Behavioral:  Negative for suicidal ideas. The patient has insomnia.     Past Medical History:  Diagnosis Date   High cholesterol    Hypertension    Prostate CA (HCC)     No past surgical history on file.  Family History  Problem Relation Age of Onset   Hypertension Mother    Lung cancer Mother 69 - 57       smoked   Hypertension Father    Cancer Father        blood cancer   Diabetes Maternal Grandmother    Heart disease Paternal Grandfather    Prostate cancer Maternal Uncle        metastatic    Social History Reviewed with no changes to be made today.   Outpatient Medications Prior to Visit  Medication Sig Dispense Refill   amLODipine  (NORVASC ) 10 MG tablet Take 1 tablet (10 mg total) by mouth  daily. 90 tablet 1   cyanocobalamin  (VITAMIN B12) 1000 MCG/ML injection Inject 1 mL (1,000 mcg total) into the muscle every 30 (thirty) days. 1 mL 3   enzalutamide  (XTANDI ) 40 MG tablet Take 4 tablets (160 mg total) by mouth daily. 120 tablet 2   ezetimibe  (ZETIA ) 10 MG tablet Take 1 tablet (10 mg total) by mouth daily. 90 tablet 3   gabapentin   (NEURONTIN ) 100 MG capsule Take 1 capsule (100 mg total) by mouth 3 (three) times daily. 90 capsule 3   losartan -hydrochlorothiazide  (HYZAAR ) 50-12.5 MG tablet Take 1 tablet by mouth daily. 90 tablet 1   relugolix  (ORGOVYX ) 120 MG tablet Take 1 tablet (120 mg total) by mouth daily. 30 tablet 5   rosuvastatin  (CRESTOR ) 20 MG tablet Take 1 tablet (20 mg total) by mouth daily. 90 tablet 3   trazodone  (DESYREL ) 300 MG tablet Take 1 tablet (300 mg total) by mouth at bedtime. 90 tablet 1   No facility-administered medications prior to visit.    No Known Allergies     Objective:    BP 115/70 (BP Location: Left Arm, Patient Position: Sitting, Cuff Size: Normal)   Pulse 79   Resp 19   Ht 5' 10.5 (1.791 m)   Wt 182 lb 6.4 oz (82.7 kg)   SpO2 100%   BMI 25.80 kg/m  Wt Readings from Last 3 Encounters:  10/29/23 182 lb 6.4 oz (82.7 kg)  09/27/23 186 lb 3.2 oz (84.5 kg)  07/30/23 186 lb 9.6 oz (84.6 kg)    Physical Exam Vitals and nursing note reviewed.  Constitutional:      Appearance: He is well-developed.  HENT:     Head: Normocephalic and atraumatic.  Cardiovascular:     Rate and Rhythm: Normal rate and regular rhythm.     Heart sounds: Normal heart sounds. No murmur heard.    No friction rub. No gallop.  Pulmonary:     Effort: Pulmonary effort is normal. No tachypnea or respiratory distress.     Breath sounds: Normal breath sounds. No decreased breath sounds, wheezing, rhonchi or rales.  Chest:     Chest wall: No tenderness.  Abdominal:     General: Bowel sounds are normal.     Palpations: Abdomen is soft.  Musculoskeletal:        General: Normal range of motion.     Cervical back: Normal range of motion.  Skin:    General: Skin is warm and dry.  Neurological:     Mental Status: He is alert and oriented to person, place, and time.     Coordination: Coordination normal.  Psychiatric:        Behavior: Behavior normal. Behavior is cooperative.        Thought Content:  Thought content normal.        Judgment: Judgment normal.          Patient has been counseled extensively about nutrition and exercise as well as the importance of adherence with medications and regular follow-up. The patient was given clear instructions to go to ER or return to medical center if symptoms don't improve, worsen or new problems develop. The patient verbalized understanding.   Follow-up: No follow-ups on file.   Tyler LELON Servant, FNP-BC [facility] [place], [place] [phone]   10/29/2023, 1:55 PM

## 2023-10-30 ENCOUNTER — Other Ambulatory Visit: Payer: Self-pay

## 2023-10-30 LAB — LIPID PANEL

## 2023-10-31 LAB — CBC WITH DIFFERENTIAL/PLATELET
Basophils Absolute: 0.1 x10E3/uL (ref 0.0–0.2)
Basos: 1 %
EOS (ABSOLUTE): 0.2 x10E3/uL (ref 0.0–0.4)
Eos: 2 %
Hematocrit: 35.8 % — ABNORMAL LOW (ref 37.5–51.0)
Hemoglobin: 11.9 g/dL — ABNORMAL LOW (ref 13.0–17.7)
Immature Grans (Abs): 0 x10E3/uL (ref 0.0–0.1)
Immature Granulocytes: 0 %
Lymphocytes Absolute: 2.7 x10E3/uL (ref 0.7–3.1)
Lymphs: 28 %
MCH: 31.4 pg (ref 26.6–33.0)
MCHC: 33.2 g/dL (ref 31.5–35.7)
MCV: 95 fL (ref 79–97)
Monocytes Absolute: 0.7 x10E3/uL (ref 0.1–0.9)
Monocytes: 7 %
Neutrophils Absolute: 6.1 x10E3/uL (ref 1.4–7.0)
Neutrophils: 62 %
Platelets: 340 x10E3/uL (ref 150–450)
RBC: 3.79 x10E6/uL — ABNORMAL LOW (ref 4.14–5.80)
RDW: 12.1 % (ref 11.6–15.4)
WBC: 9.8 x10E3/uL (ref 3.4–10.8)

## 2023-10-31 LAB — LIPID PANEL
Cholesterol, Total: 168 mg/dL (ref 100–199)
HDL: 42 mg/dL (ref >39)
LDL CALC COMMENT:: 4 ratio (ref 0.0–5.0)
LDL Chol Calc (NIH): 72 mg/dL (ref 0–99)
Triglycerides: 336 mg/dL — AB (ref 0–149)
VLDL Cholesterol Cal: 54 mg/dL — AB (ref 5–40)

## 2023-11-04 ENCOUNTER — Other Ambulatory Visit: Payer: Self-pay

## 2023-11-04 ENCOUNTER — Other Ambulatory Visit: Payer: Self-pay | Admitting: Hematology

## 2023-11-04 ENCOUNTER — Other Ambulatory Visit (HOSPITAL_COMMUNITY): Payer: Self-pay

## 2023-11-04 DIAGNOSIS — C61 Malignant neoplasm of prostate: Secondary | ICD-10-CM

## 2023-11-04 MED ORDER — ENZALUTAMIDE 40 MG PO TABS
160.0000 mg | ORAL_TABLET | Freq: Every day | ORAL | 2 refills | Status: DC
  Filled 2023-11-04 – 2024-02-11 (×3): qty 120, 30d supply, fill #0
  Filled 2024-03-03: qty 120, 30d supply, fill #1
  Filled 2024-03-27 – 2024-04-01 (×3): qty 120, 30d supply, fill #2

## 2023-11-06 ENCOUNTER — Other Ambulatory Visit: Payer: Self-pay

## 2023-11-15 ENCOUNTER — Other Ambulatory Visit: Payer: Self-pay

## 2023-11-15 ENCOUNTER — Other Ambulatory Visit: Payer: Self-pay | Admitting: Hematology

## 2023-11-15 DIAGNOSIS — C61 Malignant neoplasm of prostate: Secondary | ICD-10-CM

## 2023-11-15 NOTE — Progress Notes (Signed)
 Specialty Pharmacy Refill Coordination Note  Tyler Pratt is a 68 y.o. male contacted today regarding refills of specialty medication(s) Enzalutamide  (XTANDI )   Patient requested Delivery   Delivery date: 11/19/23   Verified address: [address]   [place] [address]   Medication will be filled on 11/18/23, pending refill approval.

## 2023-11-18 ENCOUNTER — Other Ambulatory Visit: Payer: Self-pay

## 2023-11-18 ENCOUNTER — Other Ambulatory Visit (HOSPITAL_COMMUNITY): Payer: Self-pay

## 2023-11-18 MED ORDER — ENZALUTAMIDE 40 MG PO TABS
160.0000 mg | ORAL_TABLET | Freq: Every day | ORAL | 2 refills | Status: AC
  Filled 2023-11-18: qty 120, 30d supply, fill #0
  Filled 2023-12-13: qty 120, 30d supply, fill #1
  Filled 2024-01-13: qty 120, 30d supply, fill #2

## 2023-11-19 ENCOUNTER — Encounter: Payer: Self-pay | Admitting: Hematology

## 2023-11-21 ENCOUNTER — Other Ambulatory Visit: Payer: Self-pay

## 2023-11-22 ENCOUNTER — Inpatient Hospital Stay: Attending: Hematology

## 2023-11-22 ENCOUNTER — Encounter: Payer: Self-pay | Admitting: Hematology

## 2023-11-22 ENCOUNTER — Other Ambulatory Visit: Payer: Self-pay

## 2023-11-22 ENCOUNTER — Inpatient Hospital Stay: Admitting: Licensed Clinical Social Worker

## 2023-11-22 DIAGNOSIS — E538 Deficiency of other specified B group vitamins: Secondary | ICD-10-CM | POA: Insufficient documentation

## 2023-11-22 DIAGNOSIS — C61 Malignant neoplasm of prostate: Secondary | ICD-10-CM

## 2023-11-22 MED ORDER — CYANOCOBALAMIN 1000 MCG/ML IJ SOLN
1000.0000 ug | Freq: Once | INTRAMUSCULAR | Status: AC
  Administered 2023-11-22: 1000 ug via INTRAMUSCULAR
  Filled 2023-11-22: qty 1

## 2023-11-22 NOTE — Progress Notes (Signed)
 [facility] CSW Progress Note   Interventions: CSW provided pt w/ a food and toiletry bag from the [employer].        Follow Up Plan:  Patient will contact CSW with any support or resource needs    Devere JONELLE Manna, LCSW Clinical Social Worker [facility]

## 2023-11-22 NOTE — Progress Notes (Signed)
 Specialty Pharmacy Refill Coordination Note  Tyler Pratt is a 68 y.o. male contacted today regarding refills of specialty medication(s) Relugolix  (Orgovyx )   Patient requested Delivery   Delivery date: 12/02/23   Verified address: [address]   [name] [address]   Medication will be filled on 08.15.25.

## 2023-11-23 ENCOUNTER — Ambulatory Visit: Payer: Self-pay | Admitting: Nurse Practitioner

## 2023-11-29 ENCOUNTER — Other Ambulatory Visit: Payer: Self-pay

## 2023-11-29 ENCOUNTER — Encounter: Payer: Self-pay | Admitting: Hematology

## 2023-12-11 ENCOUNTER — Other Ambulatory Visit (HOSPITAL_COMMUNITY): Payer: Self-pay

## 2023-12-13 ENCOUNTER — Other Ambulatory Visit: Payer: Self-pay

## 2023-12-13 NOTE — Progress Notes (Signed)
 Specialty Pharmacy Refill Coordination Note  Tyler Pratt is a 68 y.o. male contacted today regarding refills of specialty medication(s) Enzalutamide  (XTANDI )   Patient requested Delivery   Delivery date: 12/18/23   Verified address: [address]   [name] [address]   Medication will be filled on 12/17/23.

## 2023-12-17 ENCOUNTER — Other Ambulatory Visit: Payer: Self-pay

## 2023-12-17 NOTE — Progress Notes (Signed)
 Specialty Pharmacy Ongoing Clinical Assessment Note  Tyler Pratt is a 67 y.o. male who is being followed by the specialty pharmacy service for RxSp Oncology   Patient's specialty medication(s) reviewed today: Enzalutamide  (XTANDI ); Relugolix  (Orgovyx )   Missed doses in the last 4 weeks: 0   Patient/Caregiver did not have any additional questions or concerns.   Therapeutic benefit summary: Patient is achieving benefit   Adverse events/side effects summary: Experienced adverse events/side effects (hot flashes and abnormal dreams, both are tolerable at this time)   Patient's therapy is appropriate to: Continue    Goals Addressed             This Visit's Progress    Slow Disease Progression   On track    Patient is on track. Patient will maintain adherence. Patient's PSA remains <0.02 ng/mL as of 09/27/23.          Follow up: 3 months  Silvano LOISE Dolly Specialty Pharmacist

## 2023-12-19 ENCOUNTER — Other Ambulatory Visit: Payer: Self-pay | Admitting: Nurse Practitioner

## 2023-12-19 DIAGNOSIS — C61 Malignant neoplasm of prostate: Secondary | ICD-10-CM

## 2023-12-19 DIAGNOSIS — E538 Deficiency of other specified B group vitamins: Secondary | ICD-10-CM

## 2023-12-19 NOTE — Progress Notes (Signed)
 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  -staging CT showed enlarged abdominal adenopathy, consistent with metastatic disease. -He is currently on Orgovyx  120 mg daily and Xtandi  160 mg daily started in June 2023 -He has had excellent response to treatment, his PSA level has been undetectable since October 2023 -He is clinically doing very well,  and tolerates treatment well. -his CT CAP w contrast on 07/05/2022 was negative for PE or other acute findings, his previous adenopathy has resolved, indicating good response to therapy  -PSA < 0.01.  - Continue Orgovyx  120 mg daily and Xtandi  160 mg daily.  Currently prescribed by cancer center.  Due to transportation concerns, patient no longer following up with urologist on routine basis.   Vitamin B12 deficiency Most recent B12 level was 287.  Continue B12 injection today.  Awaiting results of B12 levels from today's visit. Proceed with B12 injections every 3 months as indicated.  History of prostate cancer Most recent PSA <0.01.  Awaiting results from today's PSA.  Patient experiencing some hot flashes and fatigue from Orgovyx  and Xtandi . Symptoms are manageable. Continue current treatment with Orgovyx  120 mg and Xtandi  150mg  daily as prescribed.   Plan  Labs reviewed. - CBC and CMP are  unremarkable. - Awaiting results of PSA and B12 levels. Proceed with vitamin B12 injection today. Continue Orgovyx  and Xtandi  daily. Labs, follow-up, and B12 injection in 3 months. The patient understands the plans discussed today and is in agreement with them.  He knows to contact our office if he develops concerns prior to his next appointment.  The patient understands the plans discussed today and is in agreement with them.  He knows to contact our office if he develops concerns prior to his next appointment.  Time spent with the patient was approximately 25 minutes. This time included reviewing progress notes, labs, imaging studies, and discussing plan for follow up.    Tyler FORBES Lessen, NP  [facility] - A DEPT OF [facility] [address] [place] [address] Dept: [phone] Dept Fax: [phone]   No orders of the defined types were placed in this encounter.     CHIEF COMPLAINT:  CC: Metastatic prostate cancer; B12 deficiency  Current Treatment: Orgovyx  120 mg daily, Xtandi  160 mg daily, B12 injections monthly  INTERVAL HISTORY:  Tyler Pratt is here today for repeat clinical assessment.  He last saw me on 09/27/2023. He reports feeling fatigued. He has intermittent hot flashes. They are gradually getting more severe. Thurs far, they continue to be manageable. He denies chest pain, chest pressure, or shortness of breath. He denies headaches or visual disturbances. He denies abdominal pain, nausea, vomiting, or changes in bowel or bladder habits.   He denies fevers or chills. He denies pain. His appetite is good. His weight has been stable.  I  have reviewed the past medical history, past surgical history, social history and family history with the patient and they are unchanged from previous note.  ALLERGIES:  has no known allergies.  MEDICATIONS:  Current Outpatient Medications  Medication Sig Dispense Refill    amLODipine  (NORVASC ) 10 MG tablet Take 1 tablet (10 mg total) by mouth daily. 90 tablet 1   cyanocobalamin  (VITAMIN B12) 1000 MCG/ML injection Inject 1 mL (1,000 mcg total) into the muscle every 30 (thirty) days. 1 mL 3   enzalutamide  (XTANDI ) 40 MG tablet Take 4 tablets (160 mg total) by mouth daily. 120 tablet 2   enzalutamide  (XTANDI ) 40 MG tablet Take 4 tablets (160 mg total) by mouth daily. 120 tablet 2   ezetimibe  (ZETIA ) 10 MG tablet Take 1 tablet (10 mg total) by mouth daily. 90 tablet 3   gabapentin  (NEURONTIN ) 100 MG capsule Take 1 capsule (100 mg total) by mouth 3 (three) times daily. 90 capsule 3   losartan -hydrochlorothiazide  (HYZAAR ) 50-12.5 MG tablet Take 1 tablet by mouth daily. 90 tablet 1   relugolix  (ORGOVYX ) 120 MG tablet Take 1 tablet (120 mg total) by mouth daily. 30 tablet 5   rosuvastatin  (CRESTOR ) 20 MG tablet Take 1 tablet (20 mg total) by mouth daily. 90 tablet 3   trazodone  (DESYREL ) 300 MG tablet Take 1 tablet (300 mg total) by mouth at bedtime. 90 tablet 1   No current facility-administered medications for this visit.    HISTORY OF PRESENT ILLNESS:   Oncology History Overview Note   Cancer Staging  Prostate cancer metastatic to intrapelvic lymph node [facility]) Staging form: Prostate, AJCC 8th Edition - Clinical stage from 10/16/2021: Stage IVB (cT1c, cN1, cM1b, PSA: 1523, Grade Group: 5) - Unsigned Histopathologic type: Adenocarcinoma, NOS Stage prefix: Initial diagnosis Prostate specific antigen (PSA) range: 20 or greater Gleason primary pattern: 4 Gleason secondary pattern: 5 Gleason score: 9 Histologic grading system: 5 grade system Number of biopsy cores examined: 12 Number of biopsy cores positive: 12 Location of positive needle core biopsies: Both sides     Prostate cancer metastatic to intrapelvic lymph node (HCC)  10/15/2021 Initial Diagnosis   Prostate cancer metastatic to intrapelvic lymph node (HCC)    Genetic Testing   [facility] CancerNext  Panel+RNA was Negative. Report date is 03/27/2023.   The [facility] CancerNext+RNAinsight Panel includes sequencing, rearrangement analysis, and RNA analysis for the following 39 genes: APC, ATM, BAP1, BARD1, BMPR1A, BRCA1, BRCA2, BRIP1, CDH1, CDKN2A, CHEK2, FH, FLCN, MET, MLH1, MSH2, MSH6, MUTYH, NF1, NTHL1, PALB2, PMS2, PTEN, RAD51C, RAD51D, SMAD4, STK11, TP53, TSC1, TSC2, and VHL (sequencing and deletion/duplication); AXIN2, HOXB13, MBD4, MSH3, POLD1 and POLE (sequencing only); EPCAM and GREM1 (deletion/duplication only).        REVIEW OF SYSTEMS:   Constitutional: Denies fevers, chills or abnormal weight loss. Fatigue. Intermittent hot flashes.  Eyes: Denies blurriness of vision Ears, nose, mouth, throat, and face: Denies mucositis or sore throat Respiratory: Denies cough, dyspnea or wheezes Cardiovascular: Denies palpitation, chest discomfort or lower extremity swelling Gastrointestinal:  Denies nausea, heartburn or change in bowel habits Skin: Denies abnormal skin rashes Lymphatics: Denies new lymphadenopathy or easy bruising Neurological:Denies numbness, tingling or new weaknesses Behavioral/Psych: Mood is stable, no new changes  All other systems were reviewed with the patient and are negative.   VITALS:   Today's Vitals   12/20/23 1400 12/20/23 1420  BP:  126/66  Pulse:  77  Resp:  16  Temp:  98 F (36.7 C)  TempSrc:  Temporal  SpO2:  98%  Weight:  181 lb 12.8 oz (82.5 kg)  Height:  5' 10.5 (1.791 m)  PainSc: 0-No pain    Body mass index is 25.72 kg/m.   Wt Readings from Last 3 Encounters:  12/20/23 181 lb 12.8 oz (82.5 kg)  10/29/23 182 lb 6.4 oz (82.7 kg)  09/27/23 186 lb 3.2 oz (84.5 kg)    Body mass index is 25.72 kg/m.  Performance status (ECOG): 1 - Symptomatic but completely ambulatory  PHYSICAL EXAM:   GENERAL:alert, no distress and comfortable SKIN: skin color, texture, turgor are normal, no rashes or significant lesions EYES: normal, Conjunctiva  are pink and non-injected, sclera clear OROPHARYNX:no exudate, no erythema and lips, buccal mucosa, and tongue normal  NECK: supple, thyroid normal size, non-tender, without nodularity LYMPH:  no palpable lymphadenopathy in the cervical, axillary or inguinal LUNGS: clear to auscultation and percussion with normal breathing effort HEART: regular rate & rhythm and no murmurs and no lower extremity edema ABDOMEN:abdomen soft, non-tender and normal bowel sounds Musculoskeletal:no cyanosis of digits and no clubbing  NEURO: alert & oriented x 3 with fluent speech, no focal motor/sensory deficits  LABORATORY DATA:  I have reviewed the data as listed    Component Value Date/Time   NA 140 12/20/2023 1401   NA 139 10/25/2021 1641   K 4.0 12/20/2023 1401   CL 105 12/20/2023 1401   CO2 30 12/20/2023 1401   GLUCOSE 131 (H) 12/20/2023 1401   BUN 14 12/20/2023 1401   BUN 17 10/25/2021 1641   CREATININE 1.08 12/20/2023 1401   CALCIUM  9.2 12/20/2023 1401   PROT 6.9 12/20/2023 1401   PROT 6.7 10/25/2021 1641   ALBUMIN 4.3 12/20/2023 1401   ALBUMIN 4.6 10/25/2021 1641   AST 12 (L) 12/20/2023 1401   ALT 9 12/20/2023 1401   ALKPHOS 86 12/20/2023 1401   BILITOT 0.3 12/20/2023 1401   GFRNONAA >60 12/20/2023 1401   GFRAA  02/16/2007 1209    >60        The eGFR has been calculated using the MDRD equation. This calculation has not been validated in all clinical    Lab Results  Component Value Date   WBC 10.1 12/20/2023   NEUTROABS 6.1 12/20/2023   HGB 12.0 (L) 12/20/2023   HCT 36.1 (L) 12/20/2023   MCV 90.3 12/20/2023   PLT 332 12/20/2023

## 2023-12-19 NOTE — Assessment & Plan Note (Addendum)
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  -staging CT showed enlarged abdominal adenopathy, consistent with metastatic disease. -He is currently on Orgovyx  120 mg daily and Xtandi  160 mg daily started in June 2023 -He has had excellent response to treatment, his PSA level has been undetectable since October 2023 -He is clinically doing very well,  and tolerates treatment well. -his CT CAP w contrast on 07/05/2022 was negative for PE or other acute findings, his previous adenopathy has resolved, indicating good response to therapy  -PSA < 0.01.  - Continue Orgovyx  120 mg daily and Xtandi  160 mg daily.  Currently prescribed by cancer center.  Due to transportation concerns, patient no longer following up with urologist on routine basis.

## 2023-12-20 ENCOUNTER — Inpatient Hospital Stay: Attending: Hematology

## 2023-12-20 ENCOUNTER — Inpatient Hospital Stay

## 2023-12-20 ENCOUNTER — Inpatient Hospital Stay (HOSPITAL_BASED_OUTPATIENT_CLINIC_OR_DEPARTMENT_OTHER): Admitting: Nurse Practitioner

## 2023-12-20 VITALS — BP 126/66 | HR 77 | Temp 98.0°F | Resp 16 | Ht 70.5 in | Wt 181.8 lb

## 2023-12-20 DIAGNOSIS — R5383 Other fatigue: Secondary | ICD-10-CM | POA: Diagnosis not present

## 2023-12-20 DIAGNOSIS — C61 Malignant neoplasm of prostate: Secondary | ICD-10-CM | POA: Insufficient documentation

## 2023-12-20 DIAGNOSIS — C419 Malignant neoplasm of bone and articular cartilage, unspecified: Secondary | ICD-10-CM

## 2023-12-20 DIAGNOSIS — C775 Secondary and unspecified malignant neoplasm of intrapelvic lymph nodes: Secondary | ICD-10-CM | POA: Diagnosis not present

## 2023-12-20 DIAGNOSIS — E538 Deficiency of other specified B group vitamins: Secondary | ICD-10-CM | POA: Insufficient documentation

## 2023-12-20 DIAGNOSIS — R232 Flushing: Secondary | ICD-10-CM | POA: Insufficient documentation

## 2023-12-20 LAB — VITAMIN B12: Vitamin B-12: 580 pg/mL (ref 180–914)

## 2023-12-20 LAB — CBC WITH DIFFERENTIAL (CANCER CENTER ONLY)
Abs Immature Granulocytes: 0.02 K/uL (ref 0.00–0.07)
Basophils Absolute: 0.1 K/uL (ref 0.0–0.1)
Basophils Relative: 1 %
Eosinophils Absolute: 0.2 K/uL (ref 0.0–0.5)
Eosinophils Relative: 2 %
HCT: 36.1 % — ABNORMAL LOW (ref 39.0–52.0)
Hemoglobin: 12 g/dL — ABNORMAL LOW (ref 13.0–17.0)
Immature Granulocytes: 0 %
Lymphocytes Relative: 30 %
Lymphs Abs: 3 K/uL (ref 0.7–4.0)
MCH: 30 pg (ref 26.0–34.0)
MCHC: 33.2 g/dL (ref 30.0–36.0)
MCV: 90.3 fL (ref 80.0–100.0)
Monocytes Absolute: 0.7 K/uL (ref 0.1–1.0)
Monocytes Relative: 7 %
Neutro Abs: 6.1 K/uL (ref 1.7–7.7)
Neutrophils Relative %: 60 %
Platelet Count: 332 K/uL (ref 150–400)
RBC: 4 MIL/uL — ABNORMAL LOW (ref 4.22–5.81)
RDW: 12.1 % (ref 11.5–15.5)
WBC Count: 10.1 K/uL (ref 4.0–10.5)
nRBC: 0 % (ref 0.0–0.2)

## 2023-12-20 LAB — CMP (CANCER CENTER ONLY)
ALT: 9 U/L (ref 0–44)
AST: 12 U/L — ABNORMAL LOW (ref 15–41)
Albumin: 4.3 g/dL (ref 3.5–5.0)
Alkaline Phosphatase: 86 U/L (ref 38–126)
Anion gap: 5 (ref 5–15)
BUN: 14 mg/dL (ref 8–23)
CO2: 30 mmol/L (ref 22–32)
Calcium: 9.2 mg/dL (ref 8.9–10.3)
Chloride: 105 mmol/L (ref 98–111)
Creatinine: 1.08 mg/dL (ref 0.61–1.24)
GFR, Estimated: >60 mL/min (ref >60)
Glucose, Bld: 131 mg/dL — ABNORMAL HIGH (ref 70–99)
Potassium: 4 mmol/L (ref 3.5–5.1)
Sodium: 140 mmol/L (ref 135–145)
Total Bilirubin: 0.3 mg/dL (ref 0.0–1.2)
Total Protein: 6.9 g/dL (ref 6.5–8.1)

## 2023-12-20 MED ORDER — CYANOCOBALAMIN 1000 MCG/ML IJ SOLN
1000.0000 ug | Freq: Once | INTRAMUSCULAR | Status: AC
  Administered 2023-12-20: 1000 ug via INTRAMUSCULAR
  Filled 2023-12-20: qty 1

## 2023-12-21 LAB — PROSTATE-SPECIFIC AG, SERUM (LABCORP): Prostate Specific Ag, Serum: <0.1 ng/mL (ref 0.0–4.0)

## 2023-12-22 ENCOUNTER — Encounter: Payer: Self-pay | Admitting: Nurse Practitioner

## 2023-12-22 ENCOUNTER — Encounter: Payer: Self-pay | Admitting: Hematology

## 2023-12-22 LAB — INTRINSIC FACTOR ANTIBODIES: Intrinsic Factor: 1 [AU]/ml (ref 0.0–1.1)

## 2023-12-24 ENCOUNTER — Other Ambulatory Visit: Payer: Self-pay | Admitting: Hematology

## 2023-12-30 ENCOUNTER — Other Ambulatory Visit (HOSPITAL_COMMUNITY): Payer: Self-pay

## 2023-12-30 ENCOUNTER — Other Ambulatory Visit: Payer: Self-pay | Admitting: Vascular Surgery

## 2023-12-30 ENCOUNTER — Other Ambulatory Visit: Payer: Self-pay

## 2023-12-30 DIAGNOSIS — I7102 Dissection of abdominal aorta: Secondary | ICD-10-CM

## 2024-01-01 ENCOUNTER — Other Ambulatory Visit: Payer: Self-pay

## 2024-01-01 NOTE — Progress Notes (Signed)
 Specialty Pharmacy Refill Coordination Note  Tyler Pratt is a 68 y.o. male contacted today regarding refills of specialty medication(s) Relugolix  (Orgovyx )   Patient requested Delivery   Delivery date: 01/07/24   Verified address: [address]   [place] [address]   Medication will be filled on 09.22.25.

## 2024-01-06 ENCOUNTER — Other Ambulatory Visit: Payer: Self-pay

## 2024-01-06 ENCOUNTER — Inpatient Hospital Stay: Admitting: Licensed Clinical Social Worker

## 2024-01-06 DIAGNOSIS — C61 Malignant neoplasm of prostate: Secondary | ICD-10-CM

## 2024-01-06 DIAGNOSIS — I7102 Dissection of abdominal aorta: Secondary | ICD-10-CM

## 2024-01-06 NOTE — Progress Notes (Signed)
 [facility] CSW Progress Note   Interventions: Provided pt w/ a food bag from [employer].      Follow Up Plan:  Patient will contact CSW with any support or resource needs    Devere JONELLE Manna, LCSW Clinical Social Worker [facility]

## 2024-01-08 ENCOUNTER — Other Ambulatory Visit: Payer: Self-pay

## 2024-01-09 ENCOUNTER — Other Ambulatory Visit: Payer: Self-pay

## 2024-01-13 ENCOUNTER — Other Ambulatory Visit: Payer: Self-pay

## 2024-01-13 NOTE — Progress Notes (Signed)
 Specialty Pharmacy Refill Coordination Note  Tyler Pratt is a 68 y.o. male contacted today regarding refills of specialty medication(s) Enzalutamide  (XTANDI )   Patient requested Delivery   Delivery date: 01/16/24   Verified address: [address]    [place] [address]   Medication will be filled on 01/15/24.

## 2024-01-14 ENCOUNTER — Other Ambulatory Visit: Payer: Self-pay

## 2024-01-16 ENCOUNTER — Other Ambulatory Visit: Payer: Self-pay

## 2024-01-21 ENCOUNTER — Ambulatory Visit: Payer: Medicare Other | Attending: Nurse Practitioner

## 2024-01-21 VITALS — Ht 70.5 in | Wt 181.0 lb

## 2024-01-21 DIAGNOSIS — Z0000 Encounter for general adult medical examination without abnormal findings: Secondary | ICD-10-CM | POA: Diagnosis not present

## 2024-01-21 NOTE — Progress Notes (Signed)
 Subjective:   Tyler Pratt is a 68 y.o. male who presents for Medicare Annual/Subsequent preventive examination.  Visit Complete: Virtual I connected with  Tyler Pratt on 01/21/24 by a audio enabled telemedicine application and verified that I am speaking with the correct person using two identifiers.  Patient Location: Home  Provider Location: Home Office  I discussed the limitations of evaluation and management by telemedicine. The patient expressed understanding and agreed to proceed.  Vital Signs: Because this visit was a virtual/telehealth visit, some criteria may be missing or patient reported. Any vitals not documented were not able to be obtained and vitals that have been documented are patient reported.    Cardiac Risk Factors include: advanced age (>71men, >48 women);dyslipidemia;hypertension;male gender;smoking/ tobacco exposure     Objective:    Today's Vitals   01/21/24 1111  Weight: 181 lb (82.1 kg)  Height: 5' 10.5 (1.791 m)   Body mass index is 25.6 kg/m.     01/21/2024   11:17 AM 01/15/2023    7:45 PM 12/24/2022    1:11 PM 11/01/2021    2:12 PM 10/16/2021    9:48 AM  Advanced Directives  Does Patient Have a Medical Advance Directive? No No No No No  Would patient like information on creating a medical advance directive? No - Patient declined Yes (MAU/Ambulatory/Procedural Areas - Information given) No - Patient declined Yes (MAU/Ambulatory/Procedural Areas - Information given)     Current Medications (verified) Outpatient Encounter Medications as of 01/21/2024  Medication Sig   amLODipine  (NORVASC ) 10 MG tablet Take 1 tablet (10 mg total) by mouth daily.   cyanocobalamin  (VITAMIN B12) 1000 MCG/ML injection Inject 1 mL (1,000 mcg total) into the muscle every 30 (thirty) days.   enzalutamide  (XTANDI ) 40 MG tablet Take 4 tablets (160 mg total) by mouth daily.   enzalutamide  (XTANDI ) 40 MG tablet Take 4 tablets (160 mg total) by mouth daily.   ezetimibe   (ZETIA ) 10 MG tablet Take 1 tablet (10 mg total) by mouth daily.   gabapentin  (NEURONTIN ) 100 MG capsule Take 1 capsule (100 mg total) by mouth 3 (three) times daily.   losartan -hydrochlorothiazide  (HYZAAR ) 50-12.5 MG tablet Take 1 tablet by mouth daily.   relugolix  (ORGOVYX ) 120 MG tablet Take 1 tablet (120 mg total) by mouth daily.   rosuvastatin  (CRESTOR ) 20 MG tablet Take 1 tablet (20 mg total) by mouth daily.   trazodone  (DESYREL ) 300 MG tablet Take 1 tablet (300 mg total) by mouth at bedtime.   No facility-administered encounter medications on file as of 01/21/2024.    Allergies (verified) Patient has no known allergies.   History: Past Medical History:  Diagnosis Date   High cholesterol    Hypertension    Prostate CA (HCC)    No past surgical history on file. Family History  Problem Relation Age of Onset   Hypertension Mother    Lung cancer Mother 10 - 46       smoked   Hypertension Father    Cancer Father        blood cancer   Diabetes Maternal Grandmother    Heart disease Paternal Grandfather    Prostate cancer Maternal Uncle        metastatic   Social History   Socioeconomic History   Marital status: Single    Spouse name: Not on file   Number of children: Not on file   Years of education: Not on file   Highest education level: Not on file  Occupational History   Not on file  Tobacco Use   Smoking status: Every Day    Current packs/day: 1.00    Average packs/day: 1 pack/day for 40.0 years (40.0 ttl pk-yrs)    Types: Cigarettes   Smokeless tobacco: Never  Vaping Use   Vaping status: Never Used  Substance and Sexual Activity   Alcohol use: Not Currently    Comment: weekly    Drug use: Yes    Types: Cocaine    Comment: crack(occ)   Sexual activity: Not on file  Other Topics Concern   Not on file  Social History Narrative   Not on file   Social Drivers of Health   Financial Resource Strain: Low Risk  (01/21/2024)   Overall Financial Resource  Strain (CARDIA)    Difficulty of Paying Living Expenses: Not hard at all  Food Insecurity: Food Insecurity Present (01/21/2024)   Hunger Vital Sign    Worried About Running Out of Food in the Last Year: Sometimes true    Ran Out of Food in the Last Year: Sometimes true  Transportation Needs: Unmet Transportation Needs (01/21/2024)   PRAPARE - Administrator, Civil Service (Medical): No    Lack of Transportation (Non-Medical): Yes  Physical Activity: Inactive (01/21/2024)   Exercise Vital Sign    Days of Exercise per Week: 0 days    Minutes of Exercise per Session: 0 min  Stress: No Stress Concern Present (01/21/2024)   [name] of Occupational Health - Occupational Stress Questionnaire    Feeling of Stress: Only a little  Social Connections: Moderately Isolated (01/21/2024)   Social Connection and Isolation Panel    Frequency of Communication with Friends and Family: More than three times a week    Frequency of Social Gatherings with Friends and Family: Three times a week    Attends Religious Services: 1 to 4 times per year    Active Member of Clubs or Organizations: No    Attends Banker Meetings: Never    Marital Status: Divorced    Tobacco Counseling Ready to quit: Not Answered Counseling given: Not Answered   Clinical Intake:  Pre-visit preparation completed: Yes  Pain : No/denies pain     BMI - recorded: 25.71 Nutritional Status: BMI 25 -29 Overweight Nutritional Risks: None Diabetes: No  How often do you need to have someone help you when you read instructions, pamphlets, or other written materials from your doctor or pharmacy?: 1 - Never What is the last grade level you completed in school?: associates  Interpreter Needed?: No  Information entered by :: Arnette Hoots, CMA   Activities of Daily Living    01/21/2024   11:13 AM  In your present state of health, do you have any difficulty performing the following activities:   Hearing? 0  Vision? 0  Difficulty concentrating or making decisions? 0  Walking or climbing stairs? 1  Dressing or bathing? 0  Doing errands, shopping? 0  Preparing Food and eating ? N  Using the Toilet? N  In the past six months, have you accidently leaked urine? N  Do you have problems with loss of bowel control? N  Managing your Medications? N  Managing your Finances? N  Housekeeping or managing your Housekeeping? N    Patient Care Team: Theotis Haze ORN, NP as PCP - General (Nurse Practitioner) Vertell Pont, RN as Oncology Nurse Navigator Duffy, Macario HERO, LCSW as Social Worker (Licensed Clinical Social Worker) Lanny Callander, MD as  Attending Physician (Hematology and Oncology) Sherrilee Belvie CROME, MD as Consulting Physician (Urology)  Indicate any recent Medical Services you may have received from other than [facility] providers in the past year (date may be approximate).     Assessment:   This is a routine wellness examination for Tyler Pratt.  Hearing/Vision screen Hearing Screening - Comments:: No issues Vision Screening - Comments:: No issues. Unsure of name   Goals Addressed             This Visit's Progress    Patient Stated       Improving on diet and cutting back on smoking.       Depression Screen    01/21/2024   11:19 AM 12/20/2023    2:00 PM 10/29/2023    1:41 PM 07/30/2023    2:20 PM 04/30/2023    5:01 PM 01/25/2023    3:49 PM 01/15/2023    7:44 PM  PHQ 2/9 Scores  PHQ - 2 Score 0 0 0 0 2 2 0  PHQ- 9 Score   0 0 8 7     Fall Risk    01/21/2024   11:17 AM 10/29/2023    1:41 PM 07/30/2023    2:19 PM 01/15/2023    7:45 PM 10/24/2022    3:04 PM  Fall Risk   Falls in the past year? 0 0 0 0 0  Number falls in past yr: 0 0 0 0 0  Injury with Fall? 0 0 0 0 0  Risk for fall due to : No Fall Risks No Fall Risks No Fall Risks No Fall Risks No Fall Risks  Follow up Falls evaluation completed;Education provided Falls evaluation completed Falls evaluation completed Falls  prevention discussed;Education provided;Falls evaluation completed Falls evaluation completed    MEDICARE RISK AT HOME: Medicare Risk at Home Any stairs in or around the home?: Yes If so, are there any without handrails?: No Home free of loose throw rugs in walkways, pet beds, electrical cords, etc?: No Adequate lighting in your home to reduce risk of falls?: Yes Life alert?: No Use of a cane, walker or w/c?: No (getting a cane) Grab bars in the bathroom?: No Shower chair or bench in shower?: No Elevated toilet seat or a handicapped toilet?: No  TIMED UP AND GO:  Was the test performed?  No    Cognitive Function:        01/21/2024   11:19 AM 01/15/2023    7:46 PM  6CIT Screen  What Year? 0 points 0 points  What month? 0 points 0 points  What time? 0 points 0 points  Count back from 20 0 points 0 points  Months in reverse 0 points 2 points  Repeat phrase 0 points 2 points  Total Score 0 points 4 points    Immunizations  There is no immunization history on file for this patient.  TDAP status: Up to date  Flu Vaccine status: Up to date  Pneumococcal vaccine status: Up to date  Covid-19 vaccine status: Information provided on how to obtain vaccines.   Qualifies for Shingles Vaccine? Yes   Zostavax completed No   Shingrix Completed?: No.    Education has been provided regarding the importance of this vaccine. Patient has been advised to call insurance company to determine out of pocket expense if they have not yet received this vaccine. Advised may also receive vaccine at local pharmacy or Health Dept. Verbalized acceptance and understanding.  Screening Tests Health Maintenance  Topic  Date Due   Zoster Vaccines- Shingrix (1 of 2) Never done   Colonoscopy  Never done   Medicare Annual Wellness (AWV)  01/15/2024   Pneumococcal Vaccine: 50+ Years (1 of 2 - PCV) 04/29/2024 (Originally 12/29/1974)   Lung Cancer Screening  08/20/2024   Hepatitis C Screening  Completed    Meningococcal B Vaccine  Aged Out   DTaP/Tdap/Td  Discontinued   Influenza Vaccine  Discontinued   COVID-19 Vaccine  Discontinued    Health Maintenance  Health Maintenance Due  Topic Date Due   Zoster Vaccines- Shingrix (1 of 2) Never done   Colonoscopy  Never done   Medicare Annual Wellness (AWV)  01/15/2024    Colorectal cancer screening: Referral to GI placed PATIENT DECLINED. Pt aware the office will call re: appt.  Lung Cancer Screening: (Low Dose CT Chest recommended if Age 79-80 years, 20 pack-year currently smoking OR have quit w/in 15years.) does qualify.   Lung Cancer Screening Referral: Completed 08/21/2023  Additional Screening:  Hepatitis C Screening: does qualify; Completed 08/25/20  Vision Screening: Recommended annual ophthalmology exams for early detection of glaucoma and other disorders of the eye. Is the patient up to date with their annual eye exam?  Yes  Who is the provider or what is the name of the office in which the patient attends annual eye exams? Unsure of name goes to one of those eye exam and 2 pairs of glasses place.  If pt is not established with a provider, would they like to be referred to a provider to establish care? No .   Dental Screening: Recommended annual dental exams for proper oral hygiene    Community Resource Referral / Chronic Care Management: CRR required this visit?  No   CCM required this visit?  No     Plan:     I have personally reviewed and noted the following in the patient's chart:   Medical and social history Use of alcohol, tobacco or illicit drugs  Current medications and supplements including opioid prescriptions. Patient is not currently taking opioid prescriptions. Functional ability and status Nutritional status Physical activity Advanced directives List of other physicians Hospitalizations, surgeries, and ER visits in previous 12 months Vitals Screenings to include cognitive, depression, and  falls Referrals and appointments  In addition, I have reviewed and discussed with patient certain preventive protocols, quality metrics, and best practice recommendations. A written personalized care plan for preventive services as well as general preventive health recommendations were provided to patient.     Arnette LOISE Hoots, CMA   01/21/2024   After Visit Summary: (Mail) Due to this being a telephonic visit, the after visit summary with patients personalized plan was offered to patient via mail   Nurse Notes: Patient declines colonoscopy at this time. He is working on improving diet and cutting back on smoking.

## 2024-01-21 NOTE — Patient Instructions (Signed)
 [name],  Thank you for taking the time for your Medicare Wellness Visit. I appreciate your continued commitment to your health goals. Please review the care plan we discussed, and feel free to reach out if I can assist you further.  Medicare recommends these wellness visits once per year to help you and your care team stay ahead of potential health issues. These visits are designed to focus on prevention, allowing your provider to concentrate on managing your acute and chronic conditions during your regular appointments.  Please note that Annual Wellness Visits do not include a physical exam. Some assessments may be limited, especially if the visit was conducted virtually. If needed, we may recommend a separate in-person follow-up with your provider.  Ongoing Care Seeing your primary care provider every 3 to 6 months helps us  monitor your health and provide consistent, personalized care.   Referrals If a referral was made during today's visit and you haven't received any updates within two weeks, please contact the referred provider directly to check on the status.  Recommended Screenings:  Health Maintenance  Topic Date Due   Zoster (Shingles) Vaccine (1 of 2) Never done   Colon Cancer Screening  Never done   Pneumococcal Vaccine for age over 72 (1 of 2 - PCV) 04/29/2024*   Screening for Lung Cancer  08/20/2024   Medicare Annual Wellness Visit  01/20/2025   Hepatitis C Screening  Completed   Meningitis B Vaccine  Aged Out   DTaP/Tdap/Td vaccine  Discontinued   Flu Shot  Discontinued   COVID-19 Vaccine  Discontinued  *Topic was postponed. The date shown is not the original due date.       01/21/2024   11:17 AM  Advanced Directives  Does Patient Have a Medical Advance Directive? No  Would patient like information on creating a medical advance directive? No - Patient declined   Advance Care Planning is important because it: Ensures you receive medical care that aligns with your  values, goals, and preferences. Provides guidance to your family and loved ones, reducing the emotional burden of decision-making during critical moments.  Vision: Annual vision screenings are recommended for early detection of glaucoma, cataracts, and diabetic retinopathy. These exams can also reveal signs of chronic conditions such as diabetes and high blood pressure.  Dental: Annual dental screenings help detect early signs of oral cancer, gum disease, and other conditions linked to overall health, including heart disease and diabetes.  Please see the attached documents for additional preventive care recommendations.

## 2024-01-29 ENCOUNTER — Ambulatory Visit: Admitting: Nurse Practitioner

## 2024-01-30 ENCOUNTER — Other Ambulatory Visit: Payer: Self-pay

## 2024-01-31 ENCOUNTER — Other Ambulatory Visit: Payer: Self-pay

## 2024-01-31 ENCOUNTER — Other Ambulatory Visit: Payer: Self-pay | Admitting: Pharmacy Technician

## 2024-01-31 NOTE — Progress Notes (Signed)
 Specialty Pharmacy Refill Coordination Note  Tyler Pratt is a 68 y.o. male contacted today regarding refills of specialty medication(s) Relugolix  (Orgovyx )   Patient requested Delivery   Delivery date: 02/05/24   Verified address: [address]  [name]   Medication will be filled on 02/04/24.

## 2024-02-04 ENCOUNTER — Other Ambulatory Visit: Payer: Self-pay

## 2024-02-07 ENCOUNTER — Other Ambulatory Visit: Payer: Self-pay

## 2024-02-11 ENCOUNTER — Other Ambulatory Visit: Payer: Self-pay | Admitting: Pharmacy Technician

## 2024-02-11 ENCOUNTER — Other Ambulatory Visit: Payer: Self-pay | Admitting: Hematology

## 2024-02-11 ENCOUNTER — Other Ambulatory Visit: Payer: Self-pay

## 2024-02-11 DIAGNOSIS — C61 Malignant neoplasm of prostate: Secondary | ICD-10-CM

## 2024-02-11 NOTE — Progress Notes (Signed)
 Specialty Pharmacy Refill Coordination Note  [name] is a 68 y.o. male contacted today regarding refills of specialty medication(s) Enzalutamide  (XTANDI )   Patient requested Delivery   Delivery date: 02/14/24   Verified address: [address] [name]   Medication will be filled on: 02/13/24

## 2024-02-12 ENCOUNTER — Other Ambulatory Visit: Payer: Self-pay

## 2024-02-12 NOTE — Progress Notes (Unsigned)
 Office Note     CC:  Chronic infrarenal aorta dissection  Requesting Provider:  Theotis Haze ORN, NP  HPI: Tyler Pratt is a 68 y.o. (Sep 01, 1955) male presenting at the request of .Theotis Haze ORN, NP after recent incidental CT finding of chronic infrarenal aortic dissection.   Tyler Pratt was originally seen after the dissection was found incidentally when he presented to the ED with a scrotal mass, pain, and history of prostate cancer.  He underwent CT abdomen pelvis with contrast which noted incidental finding of chronic infrarenal aortic dissection.  A native of [place], he moved down to [place] 27 years ago after meeting his wife.  Now retired, he enjoys traveling, reading, and playing ping-pong.    On his visit today, Tyler Pratt was doing well.  He denies abdominal pain, back pain, chest pain.  Notes pain in bilateral lower extremities which occurs when walking, which is been present for quite a while.  Denies ischemic rest pain, denies tissue loss.  States that if he leans over a shopping cart, he can ambulate for much longer.  Furthermore, he states that he suffered 2 back injuries over the past few decades.     Past Medical History:  Diagnosis Date   High cholesterol    Hypertension    Prostate CA (HCC)     No past surgical history on file.  Social History   Socioeconomic History   Marital status: Single    Spouse name: Not on file   Number of children: Not on file   Years of education: Not on file   Highest education level: Not on file  Occupational History   Not on file  Tobacco Use   Smoking status: Every Day    Current packs/day: 1.00    Average packs/day: 1 pack/day for 40.0 years (40.0 ttl pk-yrs)    Types: Cigarettes   Smokeless tobacco: Never  Vaping Use   Vaping status: Never Used  Substance and Sexual Activity   Alcohol use: Not Currently    Comment: weekly    Drug use: Yes    Types: Cocaine    Comment: crack(occ)   Sexual activity: Not on file  Other  Topics Concern   Not on file  Social History Narrative   Not on file   Social Drivers of Health   Financial Resource Strain: Low Risk  (01/21/2024)   Overall Financial Resource Strain (CARDIA)    Difficulty of Paying Living Expenses: Not hard at all  Food Insecurity: Food Insecurity Present (01/21/2024)   Hunger Vital Sign    Worried About Running Out of Food in the Last Year: Sometimes true    Ran Out of Food in the Last Year: Sometimes true  Transportation Needs: Unmet Transportation Needs (01/21/2024)   PRAPARE - Administrator, Civil Service (Medical): No    Lack of Transportation (Non-Medical): Yes  Physical Activity: Inactive (01/21/2024)   Exercise Vital Sign    Days of Exercise per Week: 0 days    Minutes of Exercise per Session: 0 min  Stress: No Stress Concern Present (01/21/2024)   [name] of Occupational Health - Occupational Stress Questionnaire    Feeling of Stress: Only a little  Social Connections: Moderately Isolated (01/21/2024)   Social Connection and Isolation Panel    Frequency of Communication with Friends and Family: More than three times a week    Frequency of Social Gatherings with Friends and Family: Three times a week    Attends Religious Services:  1 to 4 times per year    Active Member of Clubs or Organizations: No    Attends Banker Meetings: Never    Marital Status: Divorced  Catering Manager Violence: Not At Risk (01/21/2024)   Humiliation, Afraid, Rape, and Kick questionnaire    Fear of Current or Ex-Partner: No    Emotionally Abused: No    Physically Abused: No    Sexually Abused: No   Family History  Problem Relation Age of Onset   Hypertension Mother    Lung cancer Mother 37 - 90       smoked   Hypertension Father    Cancer Father        blood cancer   Diabetes Maternal Grandmother    Heart disease Paternal Grandfather    Prostate cancer Maternal Uncle        metastatic    Current Outpatient  Medications  Medication Sig Dispense Refill   amLODipine  (NORVASC ) 10 MG tablet Take 1 tablet (10 mg total) by mouth daily. 90 tablet 1   cyanocobalamin  (VITAMIN B12) 1000 MCG/ML injection Inject 1 mL (1,000 mcg total) into the muscle every 30 (thirty) days. 1 mL 3   enzalutamide  (XTANDI ) 40 MG tablet Take 4 tablets (160 mg total) by mouth daily. 120 tablet 2   enzalutamide  (XTANDI ) 40 MG tablet Take 4 tablets (160 mg total) by mouth daily. 120 tablet 2   ezetimibe  (ZETIA ) 10 MG tablet Take 1 tablet (10 mg total) by mouth daily. 90 tablet 3   gabapentin  (NEURONTIN ) 100 MG capsule Take 1 capsule (100 mg total) by mouth 3 (three) times daily. 90 capsule 3   losartan -hydrochlorothiazide  (HYZAAR ) 50-12.5 MG tablet Take 1 tablet by mouth daily. 90 tablet 1   relugolix  (ORGOVYX ) 120 MG tablet Take 1 tablet (120 mg total) by mouth daily. 30 tablet 5   rosuvastatin  (CRESTOR ) 20 MG tablet Take 1 tablet (20 mg total) by mouth daily. 90 tablet 3   trazodone  (DESYREL ) 300 MG tablet Take 1 tablet (300 mg total) by mouth at bedtime. 90 tablet 1   No current facility-administered medications for this visit.    No Known Allergies   REVIEW OF SYSTEMS:  [X]  denotes positive finding, [ ]  denotes negative finding Cardiac  Comments:  Chest pain or chest pressure:    Shortness of breath upon exertion:    Short of breath when lying flat:    Irregular heart rhythm:        Vascular    Pain in calf, thigh, or hip brought on by ambulation:    Pain in feet at night that wakes you up from your sleep:     Blood clot in your veins:    Leg swelling:         Pulmonary    Oxygen at home:    Productive cough:     Wheezing:         Neurologic    Sudden weakness in arms or legs:     Sudden numbness in arms or legs:     Sudden onset of difficulty speaking or slurred speech:    Temporary loss of vision in one eye:     Problems with dizziness:         Gastrointestinal    Blood in stool:     Vomited blood:          Genitourinary    Burning when urinating:     Blood in urine:  Psychiatric    Major depression:         Hematologic    Bleeding problems:    Problems with blood clotting too easily:        Skin    Rashes or ulcers:        Constitutional    Fever or chills:      PHYSICAL EXAMINATION:  There were no vitals filed for this visit.  General:  WDWN in NAD; vital signs documented above Gait: Not observed HENT: WNL, normocephalic Pulmonary: normal non-labored breathing , without wheezing Cardiac: regular HR Abdomen: soft, NT, no masses Skin: without rashes Vascular Exam/Pulses:  Right Left  Radial 2+ (normal) 2+ (normal)  Ulnar    Femoral    Popliteal    DP 2+ (normal) 2+ (normal)  PT     Extremities: without ischemic changes, without Gangrene , without cellulitis; without open wounds;  Musculoskeletal: no muscle wasting or atrophy  Neurologic: A&O X 3;  No focal weakness or paresthesias are detected Psychiatric:  The pt has Normal affect.   Non-Invasive Vascular Imaging:   CVs CT abdomen pelvis with contrast  Abdominal Aorta Findings:  +-----------+-------+----------+-------+---------------+--------+----------  ----+  Location  AP (cm)Trans (cm)PSV    Waveform       ThrombusComments                                     (cm/s)                                         +-----------+-------+----------+-------+---------------+--------+----------  ----+  Proximal  2.56   2.42      46     biphasic                                +-----------+-------+----------+-------+---------------+--------+----------  ----+  Mid       2.90   2.60      128    triphasic              irregular                                                                  plaque           +-----------+-------+----------+-------+---------------+--------+----------  ----+  Distal    2.80   2.53      37     monophasic                               +-----------+-------+----------+-------+---------------+--------+----------  ----+  RT CIA Prox1.3    1.1       37     biphasic ,  damened                                 +-----------+-------+----------+-------+---------------+--------+----------  ----+  LT CIA Prox1.3    1.3       67     monophasic                              +-----------+-------+----------+-------+---------------+--------+----------  ----+      ASSESSMENT/PLAN: Tyler Pratt is a 68 y.o. male presenting with chronic infrarenal aortic dissection.  Prior imaging was reviewed and the lesion appears to be present on his 2023 CT.    On ultrasound today, the lesion is unchanged in size.  He has had no aneurysmal degeneration.  As mentioned in my previous note, I think that the dissection could have happened years ago due to cocaine use.    Regarding his lower extremity symptoms, he states that claudication symptoms are equal bilaterally.  Interestingly on exam, he has a palpable pulse in his foot.  I think that there is likely a neurogenic component. He asked for referral for spine evaluation, which I was happy to provide.  My plan is to see him in 1 years time for continued aortic follow-up as well as ABI.  He was asked to call my office should any questions or concerns arise and immediately present to the ED should any new onset claudication, ischemic rest pain, abdominal pain, back pain occur.   Fonda FORBES Rim, MD [facility] [phone] Total time of patient care including pre-visit research, consultation, and documentation greater than 45 minutes

## 2024-02-13 ENCOUNTER — Ambulatory Visit (HOSPITAL_COMMUNITY)
Admission: RE | Admit: 2024-02-13 | Discharge: 2024-02-13 | Disposition: A | Discharge status: Home / Self Care | Source: Ambulatory Visit | Attending: Vascular Surgery | Admitting: Vascular Surgery

## 2024-02-13 ENCOUNTER — Ambulatory Visit: Attending: Vascular Surgery | Admitting: Vascular Surgery

## 2024-02-13 ENCOUNTER — Encounter: Payer: Self-pay | Admitting: Vascular Surgery

## 2024-02-13 VITALS — BP 134/78 | HR 70 | Temp 97.9°F | Resp 18 | Ht 70.5 in | Wt 181.8 lb

## 2024-02-13 DIAGNOSIS — I7102 Dissection of abdominal aorta: Secondary | ICD-10-CM | POA: Diagnosis not present

## 2024-02-18 ENCOUNTER — Inpatient Hospital Stay: Attending: Hematology

## 2024-02-18 ENCOUNTER — Inpatient Hospital Stay

## 2024-02-18 ENCOUNTER — Encounter: Payer: Self-pay | Admitting: Licensed Clinical Social Worker

## 2024-02-18 ENCOUNTER — Inpatient Hospital Stay: Admitting: Hematology

## 2024-02-18 VITALS — BP 150/84 | HR 88 | Temp 98.2°F | Resp 17 | Ht 70.5 in | Wt 187.0 lb

## 2024-02-18 DIAGNOSIS — R040 Epistaxis: Secondary | ICD-10-CM | POA: Diagnosis not present

## 2024-02-18 DIAGNOSIS — Z79899 Other long term (current) drug therapy: Secondary | ICD-10-CM | POA: Insufficient documentation

## 2024-02-18 DIAGNOSIS — C61 Malignant neoplasm of prostate: Secondary | ICD-10-CM | POA: Diagnosis not present

## 2024-02-18 DIAGNOSIS — C775 Secondary and unspecified malignant neoplasm of intrapelvic lymph nodes: Secondary | ICD-10-CM

## 2024-02-18 DIAGNOSIS — E538 Deficiency of other specified B group vitamins: Secondary | ICD-10-CM

## 2024-02-18 DIAGNOSIS — C419 Malignant neoplasm of bone and articular cartilage, unspecified: Secondary | ICD-10-CM

## 2024-02-18 DIAGNOSIS — G629 Polyneuropathy, unspecified: Secondary | ICD-10-CM | POA: Diagnosis not present

## 2024-02-18 LAB — CMP (CANCER CENTER ONLY)
ALT: 10 U/L (ref 0–44)
AST: 12 U/L — ABNORMAL LOW (ref 15–41)
Albumin: 4.2 g/dL (ref 3.5–5.0)
Alkaline Phosphatase: 92 U/L (ref 38–126)
Anion gap: 7 (ref 5–15)
BUN: 9 mg/dL (ref 8–23)
CO2: 31 mmol/L (ref 22–32)
Calcium: 9.3 mg/dL (ref 8.9–10.3)
Chloride: 102 mmol/L (ref 98–111)
Creatinine: 0.93 mg/dL (ref 0.61–1.24)
GFR, Estimated: >60 mL/min (ref >60)
Glucose, Bld: 150 mg/dL — ABNORMAL HIGH (ref 70–99)
Potassium: 3.5 mmol/L (ref 3.5–5.1)
Sodium: 140 mmol/L (ref 135–145)
Total Bilirubin: 0.3 mg/dL (ref 0.0–1.2)
Total Protein: 6.9 g/dL (ref 6.5–8.1)

## 2024-02-18 LAB — CBC WITH DIFFERENTIAL (CANCER CENTER ONLY)
Abs Immature Granulocytes: 0.03 K/uL (ref 0.00–0.07)
Basophils Absolute: 0.1 K/uL (ref 0.0–0.1)
Basophils Relative: 0 %
Eosinophils Absolute: 0.2 K/uL (ref 0.0–0.5)
Eosinophils Relative: 1 %
HCT: 34.9 % — ABNORMAL LOW (ref 39.0–52.0)
Hemoglobin: 12 g/dL — ABNORMAL LOW (ref 13.0–17.0)
Immature Granulocytes: 0 %
Lymphocytes Relative: 27 %
Lymphs Abs: 3 K/uL (ref 0.7–4.0)
MCH: 30.5 pg (ref 26.0–34.0)
MCHC: 34.4 g/dL (ref 30.0–36.0)
MCV: 88.8 fL (ref 80.0–100.0)
Monocytes Absolute: 0.6 K/uL (ref 0.1–1.0)
Monocytes Relative: 5 %
Neutro Abs: 7.5 K/uL (ref 1.7–7.7)
Neutrophils Relative %: 67 %
Platelet Count: 316 K/uL (ref 150–400)
RBC: 3.93 MIL/uL — ABNORMAL LOW (ref 4.22–5.81)
RDW: 12.1 % (ref 11.5–15.5)
WBC Count: 11.3 K/uL — ABNORMAL HIGH (ref 4.0–10.5)
nRBC: 0 % (ref 0.0–0.2)

## 2024-02-18 LAB — VITAMIN B12: Vitamin B-12: 585 pg/mL (ref 180–914)

## 2024-02-18 LAB — PSA: Prostatic Specific Antigen: <0.02 ng/mL (ref 0.00–4.00)

## 2024-02-18 MED ORDER — CYANOCOBALAMIN 1000 MCG/ML IJ SOLN
1000.0000 ug | Freq: Once | INTRAMUSCULAR | Status: AC
  Administered 2024-02-18: 1000 ug via INTRAMUSCULAR
  Filled 2024-02-18: qty 1

## 2024-02-18 MED ORDER — CYANOCOBALAMIN 1000 MCG/ML IJ SOLN: 1000.0000 ug | Freq: Once | INTRAMUSCULAR | Status: DC

## 2024-02-18 NOTE — Progress Notes (Signed)
 [facility]   Telephone:(336) [phone] Fax:([phone]) [phone]   Clinic Follow up Note   Patient Care Team: Theotis Haze ORN, NP as PCP - General (Nurse Practitioner) Vertell Pont, RN as Oncology Nurse Navigator Duffy, Macario HERO, LCSW as Social Worker (Licensed Clinical Social Worker) Lanny Callander, MD as Attending Physician (Hematology and Oncology) Sherrilee Belvie CROME, MD as Consulting Physician (Urology)  Date of Service:  02/18/2024  CHIEF COMPLAINT: f/u of metastatic prostate cancer and B12 deficiency  CURRENT THERAPY:  B12 monthly injection Xtandi  and Orgovyx    Oncology History   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  -staging CT showed enlarged abdominal adenopathy, consistent with metastatic disease. -He is currently on Orgovyx  120 mg daily and Xtandi  160 mg daily started in June 2023 -He has had excellent response to treatment, his PSA level has been undetectable since October 2023 -He is clinically doing very well,  and tolerates treatment well. -He will continue follow-up with his urologist Dr. Sherrilee every 6 months who prescribes Orgovyx  for him -his CT CAP w contrast on 07/05/2022 was negative for PE or other acute findings, his previous adenopathy has resolved, indicating good response to therapy   Assessment & Plan Metastatic prostate cancer Well-controlled with current treatment regimen. PSA levels remain undetectable, indicating effective management. - Continue Xtandi  and Orgovyx  for prostate cancer management. - Refilled medications as needed. - Continue follow-up every three to four months.  Peripheral neuropathy Symptoms of burning in feet, managed with gabapentin . Current dose is 400 mg, with increased evening dose for sleep  aid. Gabapentin  can cause drowsiness, especially with increased doses. - Increase evening dose of gabapentin  to 300 mg for better symptom control and sleep aid.  Vitamin B12 deficiency on replacement therapy Vitamin B12 levels are stable with current replacement therapy. Last level was 580, within the normal range. - Continue monthly B12 injections.  Plan - He is clinically stable, will proceed to B12 injection today and continue every months  -continue Xtandi  and Orgovyx   -lab and f/u in 3 months    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 lymph node [facility]) Staging form: Prostate, AJCC 8th Edition - Clinical stage from 10/16/2021: Stage IVB (cT1c, cN1, cM1b, PSA: 1523, Grade Group: 5) - Unsigned Histopathologic type: Adenocarcinoma, NOS Stage prefix: Initial diagnosis Prostate specific antigen (PSA) range: 20 or greater Gleason primary pattern: 4 Gleason secondary pattern: 5 Gleason score: 9 Histologic grading system: 5 grade system Number of biopsy cores examined: 12 Number of biopsy cores positive: 12 Location of positive needle core biopsies: Both sides     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 Report date is 03/27/2023.   The [facility] CancerNext+RNAinsight Panel includes sequencing, rearrangement analysis, and RNA analysis for the following 39 genes: APC, ATM, BAP1, BARD1, BMPR1A, BRCA1, BRCA2, BRIP1, CDH1, CDKN2A, CHEK2, FH, FLCN, MET, MLH1, MSH2, MSH6, MUTYH, NF1, NTHL1, PALB2, PMS2, PTEN, RAD51C, RAD51D, SMAD4, STK11, TP53, TSC1, TSC2, and VHL (sequencing and deletion/duplication); AXIN2, HOXB13, MBD4, MSH3, POLD1 and POLE (sequencing only); EPCAM and GREM1 (deletion/duplication only).  Discussed the use of AI scribe  software for clinical note transcription with the patient, who gave verbal consent to proceed.  History of Present Illness Tyler Pratt is a 68 year old male with metastatic prostate cancer who presents for follow-up.  He is managing his metastatic prostate cancer with Exendia and Ogamvi, with no new issues related to treatment. PSA levels remain stable, with the last reading in September being less than 0.1.  He experiences nausea and dizziness for two days, possibly related to a temporary increase in blood pressure. A recent cardiovascular appointment recorded a blood pressure of 130. No headaches or vision problems are present. He experienced epistaxis on the morning of the visit.  He experiences leg pain and limited walking ability, attributed to stenosis. He can walk for about three to five minutes, with a maximum of ten minutes before needing to stop. He takes gabapentin , 400 mg, with two capsules in the afternoon and two at night, which aids sleep and reduces the burning sensation in his feet.     All other systems were reviewed with the patient and are negative.  MEDICAL HISTORY:  Past Medical History:  Diagnosis Date   AAA (abdominal aortic aneurysm)    High cholesterol    Hypertension    Prostate CA (HCC)     SURGICAL HISTORY: No past surgical history on file.  I have reviewed the social history and family history with the patient and they are unchanged from previous note.  ALLERGIES:  has no known allergies.  MEDICATIONS:  Current Outpatient Medications  Medication Sig Dispense Refill   amLODipine  (NORVASC ) 10 MG tablet Take 1 tablet (10 mg total) by mouth daily. 90 tablet 1   cyanocobalamin  (VITAMIN B12) 1000 MCG/ML injection Inject 1 mL (1,000 mcg total) into the muscle every 30 (thirty) days. 1 mL 3   enzalutamide  (XTANDI ) 40 MG tablet Take 4 tablets (160 mg total) by mouth daily. 120 tablet 2   enzalutamide  (XTANDI ) 40 MG tablet Take 4 tablets (160 mg total) by  mouth daily. 120 tablet 2   ezetimibe  (ZETIA ) 10 MG tablet Take 1 tablet (10 mg total) by mouth daily. 90 tablet 3   gabapentin  (NEURONTIN ) 100 MG capsule Take 1 capsule (100 mg total) by mouth 3 (three) times daily. 90 capsule 3   losartan -hydrochlorothiazide  (HYZAAR ) 50-12.5 MG tablet Take 1 tablet by mouth daily. 90 tablet 1   relugolix  (ORGOVYX ) 120 MG tablet Take 1 tablet (120 mg total) by mouth daily. 30 tablet 5   rosuvastatin  (CRESTOR ) 20 MG tablet Take 1 tablet (20 mg total) by mouth daily. 90 tablet 3   trazodone  (DESYREL ) 300 MG tablet Take 1 tablet (300 mg total) by mouth at bedtime. 90 tablet 1   Current Facility-Administered Medications  Medication Dose Route Frequency Provider Last Rate Last Admin   cyanocobalamin  (VITAMIN B12) injection 1,000 mcg  1,000 mcg Intramuscular Once Lanny Callander, MD        PHYSICAL EXAMINATION: ECOG PERFORMANCE STATUS: 1 - Symptomatic but completely ambulatory  Vitals:   02/18/24 1338 02/18/24 1341  BP: (!) 164/88 (!) 150/84  Pulse: 88   Resp: 17   Temp: 98.2 F (36.8 C)   SpO2: 97%    Wt Readings from Last 3 Encounters:  02/18/24 187 lb (84.8 kg)  02/13/24 181 lb 12.8 oz (82.5 kg)  01/21/24 181 lb (82.1 kg)     GENERAL:alert, no distress and comfortable SKIN: skin color, texture, turgor are normal, no rashes or significant  lesions EYES: normal, Conjunctiva are pink and non-injected, sclera clear NECK: supple, thyroid normal size, non-tender, without nodularity LYMPH:  no palpable lymphadenopathy in the cervical, axillary  LUNGS: clear to auscultation and percussion with normal breathing effort HEART: regular rate & rhythm and no murmurs and no lower extremity edema ABDOMEN:abdomen soft, non-tender and normal bowel sounds Musculoskeletal:no cyanosis of digits and no clubbing  NEURO: alert & oriented x 3 with fluent speech, no focal motor/sensory deficits  Physical Exam    LABORATORY DATA:  I have reviewed the data as listed     Latest Ref Rng & Units 02/18/2024    1:23 PM 12/20/2023    2:01 PM 10/29/2023    2:11 PM  CBC  WBC 4.0 - 10.5 K/uL 11.3  10.1  9.8   Hemoglobin 13.0 - 17.0 g/dL 87.9  87.9  88.0   Hematocrit 39.0 - 52.0 % 34.9  36.1  35.8   Platelets 150 - 400 K/uL 316  332  340         Latest Ref Rng & Units 02/18/2024    1:23 PM 12/20/2023    2:01 PM 09/27/2023    2:07 PM  CMP  Glucose 70 - 99 mg/dL 849  868  860   BUN 8 - 23 mg/dL 9  14  11    Creatinine 0.61 - 1.24 mg/dL 9.06  8.91  8.95   Sodium 135 - 145 mmol/L 140  140  137   Potassium 3.5 - 5.1 mmol/L 3.5  4.0  4.0   Chloride 98 - 111 mmol/L 102  105  102   CO2 22 - 32 mmol/L 31  30  28    Calcium  8.9 - 10.3 mg/dL 9.3  9.2  9.1   Total Protein 6.5 - 8.1 g/dL 6.9  6.9  6.9   Total Bilirubin 0.0 - 1.2 mg/dL 0.3  0.3  0.3   Alkaline Phos 38 - 126 U/L 92  86  85   AST 15 - 41 U/L 12  12  11    ALT 0 - 44 U/L 10  9  8        RADIOGRAPHIC STUDIES: I have personally reviewed the radiological images as listed and agreed with the findings in the report. No results found.    Orders Placed This Encounter  Procedures   SCHEDULING COMMUNICATION INJECTION    Schedule 1 hour injection appointment.   All questions were answered. The patient knows to call the clinic with any problems, questions or concerns. No barriers to learning was detected. The total time spent in the appointment was 25 minutes, including review of chart and various tests results, discussions about plan of care and coordination of care plan     Onita Mattock, MD 02/18/2024

## 2024-02-18 NOTE — Assessment & Plan Note (Signed)
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  -staging CT showed enlarged abdominal adenopathy, consistent with metastatic disease. -He is currently on Orgovyx 120 mg daily and Xtandi 160 mg daily started in June 2023 -He has had excellent response to treatment, his PSA level has been undetectable since October 2023 -He is clinically doing very well,  and tolerates treatment well. -He will continue follow-up with his urologist Dr. Ronne Binning every 6 months who prescribes Orgovyx for him -his CT CAP w contrast on 07/05/2022 was negative for PE or other acute findings, his previous adenopathy has resolved, indicating good response to therapy

## 2024-02-27 ENCOUNTER — Other Ambulatory Visit: Payer: Self-pay

## 2024-03-02 ENCOUNTER — Other Ambulatory Visit: Payer: Self-pay

## 2024-03-02 ENCOUNTER — Other Ambulatory Visit: Payer: Self-pay | Admitting: Pharmacy Technician

## 2024-03-02 NOTE — Progress Notes (Signed)
 Specialty Pharmacy Refill Coordination Note  Tyler Pratt is a 68 y.o. male contacted today regarding refills of specialty medication(s) Relugolix  (Orgovyx )   Patient requested Delivery   Delivery date: 03/06/24   Verified address: [address] [place]    Medication will be filled on: 03/05/24

## 2024-03-03 ENCOUNTER — Other Ambulatory Visit: Payer: Self-pay

## 2024-03-03 ENCOUNTER — Other Ambulatory Visit (HOSPITAL_COMMUNITY): Payer: Self-pay

## 2024-03-03 NOTE — Progress Notes (Signed)
 Specialty Pharmacy Ongoing Clinical Assessment Note  Tyler Pratt is a 68 y.o. male who is being followed by the specialty pharmacy service for RxSp Oncology   Patient's specialty medication(s) reviewed today: Enzalutamide  (XTANDI ); Relugolix  (Orgovyx )   Missed doses in the last 4 weeks: 0   Patient/Caregiver did not have any additional questions or concerns.   Therapeutic benefit summary: Patient is achieving benefit   Adverse events/side effects summary: Experienced adverse events/side effects (patient continues to experience hot flashes and abnormal dreams, both of which remain tolerable)   Patient's therapy is appropriate to: Continue    Goals Addressed             This Visit's Progress    Slow Disease Progression   On track    Patient is on track. Patient will maintain adherence. Patient's PSA remains <0.02 ng/mL as of 02/18/24.         Follow up: 3 months  Silvano LOISE Dolly Specialty Pharmacist

## 2024-03-03 NOTE — Progress Notes (Signed)
 Specialty Pharmacy Refill Coordination Note  Tyler Pratt is a 68 y.o. male contacted today regarding refills of specialty medication(s) Enzalutamide  (XTANDI )   Patient requested Delivery   Delivery date: 03/10/24   Verified address: [address] [place]   Medication will be filled on: 03/09/24

## 2024-03-04 ENCOUNTER — Other Ambulatory Visit: Payer: Self-pay

## 2024-03-09 ENCOUNTER — Other Ambulatory Visit: Payer: Self-pay

## 2024-03-09 ENCOUNTER — Encounter: Payer: Self-pay | Admitting: Nurse Practitioner

## 2024-03-09 ENCOUNTER — Ambulatory Visit: Attending: Nurse Practitioner | Admitting: Nurse Practitioner

## 2024-03-09 VITALS — BP 138/73 | HR 76 | Resp 19 | Ht 70.5 in | Wt 184.0 lb

## 2024-03-09 DIAGNOSIS — E782 Mixed hyperlipidemia: Secondary | ICD-10-CM | POA: Diagnosis not present

## 2024-03-09 DIAGNOSIS — R7303 Prediabetes: Secondary | ICD-10-CM

## 2024-03-09 DIAGNOSIS — I10 Essential (primary) hypertension: Secondary | ICD-10-CM | POA: Diagnosis not present

## 2024-03-09 DIAGNOSIS — E7841 Elevated Lipoprotein(a): Secondary | ICD-10-CM

## 2024-03-09 LAB — POCT GLYCOSYLATED HEMOGLOBIN (HGB A1C): HbA1c, POC (prediabetic range): 6.1 % (ref 5.7–6.4)

## 2024-03-09 MED ORDER — LOSARTAN POTASSIUM-HCTZ 50-12.5 MG PO TABS
1.0000 | ORAL_TABLET | Freq: Every day | ORAL | 1 refills | Status: AC
  Filled 2024-03-09 – 2024-05-06 (×2): qty 90, 90d supply, fill #0

## 2024-03-09 MED ORDER — AMLODIPINE BESYLATE 10 MG PO TABS
10.0000 mg | ORAL_TABLET | Freq: Every day | ORAL | 1 refills | Status: AC
  Filled 2024-03-09 – 2024-05-06 (×2): qty 90, 90d supply, fill #0

## 2024-03-09 MED ORDER — ROSUVASTATIN CALCIUM 20 MG PO TABS
20.0000 mg | ORAL_TABLET | Freq: Every day | ORAL | 3 refills | Status: AC
  Filled 2024-03-09 – 2024-05-06 (×2): qty 90, 90d supply, fill #0

## 2024-03-09 MED ORDER — EZETIMIBE 10 MG PO TABS
10.0000 mg | ORAL_TABLET | Freq: Every day | ORAL | 3 refills | Status: AC
  Filled 2024-03-09 – 2024-05-06 (×2): qty 90, 90d supply, fill #0

## 2024-03-09 NOTE — Progress Notes (Signed)
 "  Assessment & Plan:  Konor was seen today for hypertension.  Diagnoses and all orders for this visit:  Primary hypertension -     amLODipine  (NORVASC ) 10 MG tablet; Take 1 tablet (10 mg total) by mouth daily. -     losartan -hydrochlorothiazide  (HYZAAR ) 50-12.5 MG tablet; Take 1 tablet by mouth daily. Continue all antihypertensives as prescribed.  Reminded to bring in blood pressure log for follow  up appointment.  RECOMMENDATIONS: DASH/Mediterranean Diets are healthier choices for HTN.    Hypercholesterolemia with hypertriglyceridemia -     ezetimibe  (ZETIA ) 10 MG tablet; Take 1 tablet (10 mg total) by mouth daily. -     rosuvastatin  (CRESTOR ) 20 MG tablet; Take 1 tablet (20 mg total) by mouth daily. INSTRUCTIONS: Work on a low fat, heart healthy diet and participate in regular aerobic exercise program by working out at least 150 minutes per week; 5 days a week-30 minutes per day. Avoid red meat/beef/steak,  fried foods. junk foods, sodas, sugary drinks, unhealthy snacking, alcohol and smoking.  Drink at least 80 oz of water per day and monitor your carbohydrate intake daily.     Prediabetes -     HgB A1c Continue blood sugar control as discussed in office today, low carbohydrate diet, and regular physical exercise as tolerated, 150 minutes per week (30 min each day, 5 days per week, or 50 min 3 days per week). Keep blood sugar logs with fasting goal of 90-130 mg/dl, post prandial (after you eat) less than 180.  For Hypoglycemia: BS <60 and Hyperglycemia BS >400; contact the clinic ASAP. Annual eye exams and foot exams are recommended.     Patient has been counseled on age-appropriate routine health concerns for screening and prevention. These are reviewed and up-to-date. Referrals have been placed accordingly. Immunizations are up-to-date or declined.    Subjective:   Chief Complaint  Patient presents with   Hypertension    Tyler Pratt 68 y.o. male presents to office today  for follow up to HTN  PMH: prostate Ca (followed by Oncology), HTN, prediabetes, leukcytosis, B12 deficiency Currently being followed by Oncology and Urolgy for malignant neoplasm of prostate.   He recently underwent a vascular imaging study of his aorta. He reports that he was told the artery was about the same size as before, with no enlargement. This imaging was conducted in October, and he is scheduled for an annual follow-up to monitor the condition.  DM 2 A1c at goal of <6.5. currently controlled with lifestyle modifications.  Lab Results  Component Value Date   HGBA1C 6.1 03/09/2024   LDL at goal with rosuvastatin  20 mg daily.  Lab Results  Component Value Date   LDLCALC 72 10/29/2023     HTN Blood pressure at goal with amlodipine  10 mg and Hyzaar  50-12.5 mg daily.  BP Readings from Last 3 Encounters:  03/09/24 138/73  02/18/24 (!) 150/84  02/13/24 134/78     Review of Systems  Constitutional:  Negative for fever, malaise/fatigue and weight loss.  Eyes: Negative.  Negative for blurred vision, double vision and photophobia.  Respiratory: Negative.  Negative for cough and shortness of breath.   Cardiovascular: Negative.  Negative for chest pain, palpitations and leg swelling.  Neurological: Negative.  Negative for dizziness, focal weakness, seizures and headaches.  Psychiatric/Behavioral: Negative.  Negative for suicidal ideas.     Past Medical History:  Diagnosis Date   AAA (abdominal aortic aneurysm)    High cholesterol    Hypertension  Prostate [facility])     History reviewed. No pertinent surgical history.  Family History  Problem Relation Age of Onset   Hypertension Mother    Lung cancer Mother 81 - 108       smoked   Hypertension Father    Cancer Father        blood cancer   Diabetes Maternal Grandmother    Heart disease Paternal Grandfather    Prostate cancer Maternal Uncle        metastatic    Social History Reviewed with no changes to be made  today.   Outpatient Medications Prior to Visit  Medication Sig Dispense Refill   cyanocobalamin  (VITAMIN B12) 1000 MCG/ML injection Inject 1 mL (1,000 mcg total) into the muscle every 30 (thirty) days. 1 mL 3   enzalutamide  (XTANDI ) 40 MG tablet Take 4 tablets (160 mg total) by mouth daily. 120 tablet 2   enzalutamide  (XTANDI ) 40 MG tablet Take 4 tablets (160 mg total) by mouth daily. 120 tablet 2   gabapentin  (NEURONTIN ) 100 MG capsule Take 1 capsule (100 mg total) by mouth 3 (three) times daily. 90 capsule 3   relugolix  (ORGOVYX ) 120 MG tablet Take 1 tablet (120 mg total) by mouth daily. 30 tablet 5   trazodone  (DESYREL ) 300 MG tablet Take 1 tablet (300 mg total) by mouth at bedtime. 90 tablet 1   amLODipine  (NORVASC ) 10 MG tablet Take 1 tablet (10 mg total) by mouth daily. 90 tablet 1   ezetimibe  (ZETIA ) 10 MG tablet Take 1 tablet (10 mg total) by mouth daily. 90 tablet 3   losartan -hydrochlorothiazide  (HYZAAR ) 50-12.5 MG tablet Take 1 tablet by mouth daily. 90 tablet 1   rosuvastatin  (CRESTOR ) 20 MG tablet Take 1 tablet (20 mg total) by mouth daily. 90 tablet 3   No facility-administered medications prior to visit.    No Known Allergies     Objective:    BP 138/73 (BP Location: Right Arm, Patient Position: Sitting, Cuff Size: Normal)   Pulse 76   Resp 19   Ht 5' 10.5 (1.791 m)   Wt 184 lb (83.5 kg)   SpO2 97%   BMI 26.03 kg/m  Wt Readings from Last 3 Encounters:  03/09/24 184 lb (83.5 kg)  02/18/24 187 lb (84.8 kg)  02/13/24 181 lb 12.8 oz (82.5 kg)    Physical Exam Vitals and nursing note reviewed.  Constitutional:      Appearance: He is well-developed.  HENT:     Head: Normocephalic and atraumatic.  Cardiovascular:     Rate and Rhythm: Normal rate and regular rhythm.     Heart sounds: Normal heart sounds. No murmur heard.    No friction rub. No gallop.  Pulmonary:     Effort: Pulmonary effort is normal. No tachypnea or respiratory distress.     Breath sounds:  Normal breath sounds. No decreased breath sounds, wheezing, rhonchi or rales.  Chest:     Chest wall: No tenderness.  Musculoskeletal:        General: Normal range of motion.     Cervical back: Normal range of motion.  Skin:    General: Skin is warm and dry.  Neurological:     Mental Status: He is alert and oriented to person, place, and time.     Coordination: Coordination normal.  Psychiatric:        Behavior: Behavior normal. Behavior is cooperative.        Thought Content: Thought content normal.  Judgment: Judgment normal.          Patient has been counseled extensively about nutrition and exercise as well as the importance of adherence with medications and regular follow-up. The patient was given clear instructions to go to ER or return to medical center if symptoms don't improve, worsen or new problems develop. The patient verbalized understanding.   Follow-up: Return in about 4 months (around 07/07/2024).   Haze LELON Servant, FNP-BC [facility] [place], [place] [phone]   04/07/2024, 8:41 PM "

## 2024-03-20 ENCOUNTER — Inpatient Hospital Stay: Attending: Hematology

## 2024-03-20 ENCOUNTER — Encounter: Payer: Self-pay | Admitting: Licensed Clinical Social Worker

## 2024-03-20 DIAGNOSIS — C61 Malignant neoplasm of prostate: Secondary | ICD-10-CM | POA: Diagnosis present

## 2024-03-20 DIAGNOSIS — E538 Deficiency of other specified B group vitamins: Secondary | ICD-10-CM | POA: Diagnosis present

## 2024-03-20 MED ORDER — CYANOCOBALAMIN 1000 MCG/ML IJ SOLN
1000.0000 ug | Freq: Once | INTRAMUSCULAR | Status: AC
  Administered 2024-03-20: 1000 ug via INTRAMUSCULAR
  Filled 2024-03-20: qty 1

## 2024-03-27 ENCOUNTER — Other Ambulatory Visit: Payer: Self-pay

## 2024-03-31 ENCOUNTER — Other Ambulatory Visit: Payer: Self-pay

## 2024-03-31 ENCOUNTER — Other Ambulatory Visit (HOSPITAL_COMMUNITY): Payer: Self-pay

## 2024-04-01 ENCOUNTER — Other Ambulatory Visit: Payer: Self-pay

## 2024-04-01 ENCOUNTER — Other Ambulatory Visit (HOSPITAL_COMMUNITY): Payer: Self-pay

## 2024-04-01 NOTE — Progress Notes (Signed)
 Specialty Pharmacy Refill Coordination Note  Spoke with Tyler Pratt is a 68 y.o. male contacted today regarding refills of specialty medication(s) Enzalutamide  (XTANDI )  Doses on hand: 12  Patient requested: Delivery   Delivery date: 04/07/24   Verified address: [address] [name] [address]  Medication will be filled on 04/07/24

## 2024-04-01 NOTE — Progress Notes (Signed)
 Specialty Pharmacy Refill Coordination Note  Spoke with Tyler Pratt is a 68 y.o. male contacted today regarding refills of specialty medication(s) Relugolix  (Orgovyx )  Doses on hand: 10-12  Patient requested: Delivery   Delivery date: 04/07/24   Verified address: [address] [place] [address]  Medication will be filled on 04/06/24

## 2024-04-06 ENCOUNTER — Other Ambulatory Visit: Payer: Self-pay

## 2024-04-07 ENCOUNTER — Other Ambulatory Visit (HOSPITAL_COMMUNITY): Payer: Self-pay

## 2024-04-07 ENCOUNTER — Encounter: Payer: Self-pay | Admitting: Nurse Practitioner

## 2024-04-08 ENCOUNTER — Encounter: Payer: Self-pay | Admitting: Hematology

## 2024-04-14 ENCOUNTER — Other Ambulatory Visit (HOSPITAL_COMMUNITY): Payer: Self-pay

## 2024-04-21 ENCOUNTER — Telehealth: Payer: Self-pay

## 2024-04-21 ENCOUNTER — Other Ambulatory Visit (HOSPITAL_COMMUNITY): Payer: Self-pay

## 2024-04-21 ENCOUNTER — Encounter: Payer: Self-pay | Admitting: Hematology

## 2024-04-21 NOTE — Telephone Encounter (Signed)
 Oral Oncology Patient Advocate Encounter   Was successful in securing patient a $6000 grant from [employer] ([employer]) to provide copayment coverage for Orgovyx /Xtandi .  This will keep the out of pocket expense at $0.     The billing information is as follows and has been shared with [facility].   RxBin: [id number] PCN:  [id number] Member ID: [id number] Group ID: [id number] Dates of Eligibility: 10/24/23 through 04/21/25  Prostate

## 2024-04-24 ENCOUNTER — Inpatient Hospital Stay: Attending: Hematology

## 2024-04-24 ENCOUNTER — Encounter: Payer: Self-pay | Admitting: Licensed Clinical Social Worker

## 2024-04-24 DIAGNOSIS — C61 Malignant neoplasm of prostate: Secondary | ICD-10-CM | POA: Insufficient documentation

## 2024-04-24 DIAGNOSIS — E538 Deficiency of other specified B group vitamins: Secondary | ICD-10-CM | POA: Insufficient documentation

## 2024-04-24 MED ORDER — CYANOCOBALAMIN 1000 MCG/ML IJ SOLN
1000.0000 ug | Freq: Once | INTRAMUSCULAR | Status: AC
  Administered 2024-04-24: 1000 ug via INTRAMUSCULAR
  Filled 2024-04-24: qty 1

## 2024-05-01 ENCOUNTER — Other Ambulatory Visit: Payer: Self-pay | Admitting: Hematology

## 2024-05-01 ENCOUNTER — Other Ambulatory Visit: Payer: Self-pay

## 2024-05-01 ENCOUNTER — Encounter: Payer: Self-pay | Admitting: Hematology

## 2024-05-01 ENCOUNTER — Other Ambulatory Visit: Payer: Self-pay | Admitting: Nurse Practitioner

## 2024-05-01 DIAGNOSIS — C61 Malignant neoplasm of prostate: Secondary | ICD-10-CM

## 2024-05-04 ENCOUNTER — Other Ambulatory Visit (HOSPITAL_COMMUNITY): Payer: Self-pay

## 2024-05-04 ENCOUNTER — Other Ambulatory Visit: Payer: Self-pay | Admitting: Hematology

## 2024-05-04 ENCOUNTER — Other Ambulatory Visit: Payer: Self-pay

## 2024-05-04 DIAGNOSIS — C61 Malignant neoplasm of prostate: Secondary | ICD-10-CM

## 2024-05-04 MED ORDER — ENZALUTAMIDE 40 MG PO TABS
160.0000 mg | ORAL_TABLET | Freq: Every day | ORAL | 2 refills | Status: AC
  Filled 2024-05-04 – 2024-05-05 (×2): qty 120, 30d supply, fill #0

## 2024-05-04 MED ORDER — ORGOVYX 120 MG PO TABS
120.0000 mg | ORAL_TABLET | Freq: Every day | ORAL | 5 refills | Status: AC
  Filled 2024-05-04 – 2024-05-05 (×2): qty 30, 30d supply, fill #0

## 2024-05-05 ENCOUNTER — Other Ambulatory Visit: Payer: Self-pay

## 2024-05-05 NOTE — Progress Notes (Signed)
 Specialty Pharmacy Ongoing Clinical Assessment Note  Tyler Pratt is a 69 y.o. male who is being followed by the specialty pharmacy service for RxSp Oncology   Patient's specialty medication(s) reviewed today: Enzalutamide  (XTANDI ); Relugolix  (Orgovyx )   Missed doses in the last 4 weeks: 0   Patient/Caregiver did not have any additional questions or concerns.   Therapeutic benefit summary: Patient is achieving benefit   Adverse events/side effects summary: Experienced adverse events/side effects (patient continues to experience hot flashes and night sweats, they remain tolerable at this time)   Patient's therapy is appropriate to: Continue    Goals Addressed             This Visit's Progress    Slow Disease Progression   On track    Patient is on track. Patient will maintain adherence. Patient's PSA remains <0.02 ng/mL as of 02/18/24.         Follow up: 3 months  Silvano LOISE Dolly Specialty Pharmacist

## 2024-05-05 NOTE — Progress Notes (Signed)
 Specialty Pharmacy Refill Coordination Note  Tyler Pratt is a 69 y.o. male contacted today regarding refills of specialty medication(s) Enzalutamide  (XTANDI ); Relugolix  (Orgovyx )   Patient requested Delivery   Delivery date: 05/08/24   Verified address: [address] [place] [address]   Medication will be filled on: 05/07/24

## 2024-05-06 ENCOUNTER — Other Ambulatory Visit: Payer: Self-pay

## 2024-05-06 ENCOUNTER — Other Ambulatory Visit: Payer: Self-pay | Admitting: Nurse Practitioner

## 2024-05-06 ENCOUNTER — Other Ambulatory Visit (HOSPITAL_COMMUNITY): Payer: Self-pay

## 2024-05-06 DIAGNOSIS — G4700 Insomnia, unspecified: Secondary | ICD-10-CM

## 2024-05-06 DIAGNOSIS — G629 Polyneuropathy, unspecified: Secondary | ICD-10-CM

## 2024-05-06 MED ORDER — GABAPENTIN 100 MG PO CAPS
100.0000 mg | ORAL_CAPSULE | Freq: Three times a day (TID) | ORAL | 3 refills | Status: AC
  Filled 2024-05-06: qty 90, 30d supply, fill #0

## 2024-05-06 MED ORDER — TRAZODONE HCL 300 MG PO TABS
300.0000 mg | ORAL_TABLET | Freq: Every day | ORAL | 1 refills | Status: AC
  Filled 2024-05-06: qty 90, 90d supply, fill #0

## 2024-05-07 ENCOUNTER — Other Ambulatory Visit: Payer: Self-pay

## 2024-05-08 ENCOUNTER — Other Ambulatory Visit: Payer: Self-pay

## 2024-05-25 ENCOUNTER — Inpatient Hospital Stay

## 2024-05-25 ENCOUNTER — Inpatient Hospital Stay: Admitting: Hematology

## 2024-05-25 ENCOUNTER — Inpatient Hospital Stay: Attending: Hematology

## 2024-07-07 ENCOUNTER — Ambulatory Visit: Admitting: Nurse Practitioner
